# Patient Record
Sex: Male | Born: 1965 | Race: Black or African American | Hispanic: No | Marital: Single | Smoking: Former smoker
Health system: Southern US, Community
[De-identification: ages and names within clinical notes are randomized; demographics above are authoritative.]

## PROBLEM LIST (undated history)

## (undated) DIAGNOSIS — E7800 Pure hypercholesterolemia, unspecified: Secondary | ICD-10-CM

## (undated) DIAGNOSIS — T7840XA Allergy, unspecified, initial encounter: Secondary | ICD-10-CM

## (undated) DIAGNOSIS — I10 Essential (primary) hypertension: Secondary | ICD-10-CM

## (undated) DIAGNOSIS — R7989 Other specified abnormal findings of blood chemistry: Secondary | ICD-10-CM

## (undated) DIAGNOSIS — E119 Type 2 diabetes mellitus without complications: Secondary | ICD-10-CM

## (undated) HISTORY — PX: WISDOM TOOTH EXTRACTION: SHX21

## (undated) HISTORY — PX: UPPER GASTROINTESTINAL ENDOSCOPY: SHX188

## (undated) HISTORY — PX: COLONOSCOPY: SHX174

## (undated) HISTORY — PX: HERNIA REPAIR: SHX51

## (undated) HISTORY — DX: Other specified abnormal findings of blood chemistry: R79.89

## (undated) HISTORY — DX: Allergy, unspecified, initial encounter: T78.40XA

---

## 2008-03-05 HISTORY — PX: NASAL SINUS SURGERY: SHX719

## 2013-06-08 ENCOUNTER — Encounter (HOSPITAL_COMMUNITY): Payer: Self-pay | Admitting: Emergency Medicine

## 2013-06-08 ENCOUNTER — Emergency Department (HOSPITAL_COMMUNITY)
Admission: EM | Admit: 2013-06-08 | Discharge: 2013-06-09 | Disposition: A | Discharge status: Home / Self Care | Payer: 59 | Attending: Emergency Medicine | Admitting: Emergency Medicine

## 2013-06-08 DIAGNOSIS — I10 Essential (primary) hypertension: Secondary | ICD-10-CM | POA: Insufficient documentation

## 2013-06-08 DIAGNOSIS — Z79899 Other long term (current) drug therapy: Secondary | ICD-10-CM | POA: Insufficient documentation

## 2013-06-08 DIAGNOSIS — L03019 Cellulitis of unspecified finger: Secondary | ICD-10-CM | POA: Insufficient documentation

## 2013-06-08 DIAGNOSIS — L03011 Cellulitis of right finger: Secondary | ICD-10-CM

## 2013-06-08 DIAGNOSIS — E119 Type 2 diabetes mellitus without complications: Secondary | ICD-10-CM | POA: Insufficient documentation

## 2013-06-08 HISTORY — DX: Essential (primary) hypertension: I10

## 2013-06-08 HISTORY — DX: Pure hypercholesterolemia, unspecified: E78.00

## 2013-06-08 HISTORY — DX: Type 2 diabetes mellitus without complications: E11.9

## 2013-06-08 NOTE — ED Notes (Signed)
Pt states that approx. 1 week ago he started having swelling on the tip of his R thumb. Went to PCP who was going to lance it but didn't. Stated that it drained some pus on its own and felt better but then filled back up and is now painful again. Alert and oriented.

## 2013-06-09 MED ORDER — HYDROCODONE-ACETAMINOPHEN 5-325 MG PO TABS: 1.0000 | ORAL_TABLET | ORAL | Status: DC | PRN

## 2013-06-09 MED ORDER — HYDROCODONE-ACETAMINOPHEN 5-325 MG PO TABS
1.0000 | ORAL_TABLET | Freq: Once | ORAL | Status: AC
Start: 2013-06-09 — End: 2013-06-09
  Administered 2013-06-09: 1 via ORAL
  Filled 2013-06-09: qty 1

## 2013-06-09 MED ORDER — CEPHALEXIN 500 MG PO CAPS: 500.0000 mg | ORAL_CAPSULE | Freq: Two times a day (BID) | ORAL | Status: DC

## 2013-06-09 NOTE — Discharge Instructions (Signed)
Follow up with your doctor in 2 days for wound recheck. Monitor for signs of infection. Take tylenol or ibuprofen as needed for pain. Return to emergency department if you develop any signs of infection, redness, worsening pain or swelling.    Paronychia Paronychia is an inflammatory reaction involving the folds of the skin surrounding the fingernail. This is commonly caused by an infection in the skin around a nail. The most common cause of paronychia is frequent wetting of the hands (as seen with bartenders, food servers, nurses or others who wet their hands). This makes the skin around the fingernail susceptible to infection by bacteria (germs) or fungus. Other predisposing factors are:  Aggressive manicuring.  Nail biting.  Thumb sucking. The most common cause is a staphylococcal (a type of germ) infection, or a fungal (Candida) infection. When caused by a germ, it usually comes on suddenly with redness, swelling, pus and is often painful. It may get under the nail and form an abscess (collection of pus), or form an abscess around the nail. If the nail itself is infected with a fungus, the treatment is usually prolonged and may require oral medicine for up to one year. Your caregiver will determine the length of time treatment is required. The paronychia caused by bacteria (germs) may largely be avoided by not pulling on hangnails or picking at cuticles. When the infection occurs at the tips of the finger it is called felon. When the cause of paronychia is from the herpes simplex virus (HSV) it is called herpetic whitlow. TREATMENT  When an abscess is present treatment is often incision and drainage. This means that the abscess must be cut open so the pus can get out. When this is done, the following home care instructions should be followed. HOME CARE INSTRUCTIONS   It is important to keep the affected fingers very dry. Rubber or plastic gloves over cotton gloves should be used whenever the hand  must be placed in water.  Keep wound clean, dry and dressed as suggested by your caregiver between warm soaks or warm compresses.  Soak in warm water for fifteen to twenty minutes three to four times per day for bacterial infections. Fungal infections are very difficult to treat, so often require treatment for long periods of time.  For bacterial (germ) infections take antibiotics (medicine which kill germs) as directed and finish the prescription, even if the problem appears to be solved before the medicine is gone.  Only take over-the-counter or prescription medicines for pain, discomfort, or fever as directed by your caregiver. SEEK IMMEDIATE MEDICAL CARE IF:  You have redness, swelling, or increasing pain in the wound.  You notice pus coming from the wound.  You have a fever.  You notice a bad smell coming from the wound or dressing. Document Released: 08/15/2000 Document Revised: 05/14/2011 Document Reviewed: 04/16/2008 [facility] Patient Information 2014 [place].

## 2013-06-09 NOTE — ED Provider Notes (Signed)
CSN: [id number]     Arrival date & time 06/08/13  2144 History   First MD Initiated Contact with Patient 06/09/13 0239     Chief Complaint  Patient presents with  . Wound Infection     (Consider location/radiation/quality/duration/timing/severity/associated sxs/prior Treatment) HPI 48 yo male presents with right thumb pain and swelling x 9 days. Patient describes a throbbing pain constant rated at 8/10 currently. Patient denies any trauma to thumb. Patient does admit to clipping his nails about a week prior to the pain and swelling. Patient denies any fever/chills, N/V, or dizziness. Denies numbness or weakness. PMH significant for DM, HTN, and High cholesterol.  Past Medical History  Diagnosis Date  . Hypertension   . High cholesterol   . DM (diabetes mellitus)     type 2   No past surgical history on file. No family history on file. History  Substance Use Topics  . Smoking status: Never Smoker   . Smokeless tobacco: Not on file  . Alcohol Use: No    Review of Systems  All other systems reviewed and are negative.      Allergies  Review of patient's allergies indicates no known allergies.  Home Medications   Current Outpatient Rx  Name  Route  Sig  Dispense  Refill  . acetaminophen (TYLENOL) 325 MG tablet   Oral   Take 650 mg by mouth every 6 (six) hours as needed (pain).         . metFORMIN (GLUCOPHAGE) 500 MG tablet   Oral   Take 500 mg by mouth daily with breakfast.         . Pseudoephedrine-Ibuprofen (ADVIL COLD & SINUS LIQUI-GELS PO)   Oral   Take 1 capsule by mouth daily as needed (flu-like symptoms).         . cephALEXin (KEFLEX) 500 MG capsule   Oral   Take 1 capsule (500 mg total) by mouth 2 (two) times daily.   14 capsule   0   . HYDROcodone-acetaminophen (NORCO) 5-325 MG per tablet   Oral   Take 1-2 tablets by mouth every 4 (four) hours as needed.   10 tablet   0    BP 132/76  Pulse 84  Temp(Src) 98.2 F (36.8 C) (Oral)  SpO2  99% Physical Exam  Nursing note and vitals reviewed. Constitutional: He is oriented to person, place, and time. He appears well-developed and well-nourished. No distress.  HENT:  Head: Normocephalic and atraumatic.  Eyes: Conjunctivae are normal.  Neck: No JVD present. No tracheal deviation present.  Cardiovascular: Normal rate and regular rhythm.  Exam reveals no gallop and no friction rub.   No murmur heard. Pulmonary/Chest: Effort normal. No respiratory distress. He has no wheezes. He has no rhonchi. He has no rales.  Musculoskeletal: Normal range of motion. He exhibits no edema.       Hands: Neurological: He is alert and oriented to person, place, and time.  Skin: Skin is warm and dry. He is not diaphoretic.  Psychiatric: He has a normal mood and affect. His behavior is normal.    ED Course  Procedures (including critical care time) Labs Review Labs Reviewed - No data to display Imaging Review No results found.   EKG Interpretation None     INCISION AND DRAINAGE Performed by: Sherrie George Consent: Verbal consent obtained. Risks and benefits: risks, benefits and alternatives were discussed Time out performed prior to procedure Type: Paronychia Body area: Right Thumb Anesthesia: Digital Block Incision  was made with a scalpel. Local anesthetic: lidocaine 2% w/o epinephrine Anesthetic total: 3 ml Complexity: complex Blunt dissection to break up loculations Drainage: purulent Drainage amount: 2 ml Packing material: none Patient tolerance: Patient tolerated the procedure well with no immediate complications.  MDM   Final diagnoses:  Paronychia of right thumb  Patient afebrile with normal VS.  Exam consistent with paronychia. No evidence of joint or tendon involvement on exam.  Plan to treat patient's soft tissue skin infection with keflex and have him follow up with his PCP in 2 days for wound check. Advised patient to monitor for signs of infection, worsening  redness or swelling. Patient agrees with plan. discharged in good condition.  Meds given in ED:  Medications  HYDROcodone-acetaminophen (NORCO/VICODIN) 5-325 MG per tablet 1 tablet (1 tablet Oral Given 06/09/13 0337)    Discharge Medication List as of 06/09/2013  3:32 AM    START taking these medications   Details  cephALEXin (KEFLEX) 500 MG capsule Take 1 capsule (500 mg total) by mouth 2 (two) times daily., Starting 06/09/2013, Until Discontinued, Print    HYDROcodone-acetaminophen (NORCO) 5-325 MG per tablet Take 1-2 tablets by mouth every 4 (four) hours as needed., Starting 06/09/2013, Until Discontinued, Print           Sherrie George, PA-C 06/10/13 2241

## 2013-06-09 NOTE — ED Notes (Addendum)
Pt presents to ED with swelling and inflammation to his 1st digit on rt hand. He states  Pain onset on last Wednesday and went down on Saturday.

## 2013-06-11 NOTE — ED Provider Notes (Signed)
Medical screening examination/treatment/procedure(s) were conducted as a shared visit with non-physician practitioner(s) and myself.  I personally evaluated the patient during the encounter.  Right thumb pain and swelling, complains of infection. On exam has right thumb tenderness adjacent to the nailbed consistent with paronychia. I/D by [facility] with purulent drainage. Instructions and precautions provided. Patient is diabetic and will be placed on a course of antibiotics.   Teressa Lower, MD 06/11/13 0001

## 2013-07-01 ENCOUNTER — Ambulatory Visit: Payer: Self-pay | Admitting: Podiatrist

## 2013-07-24 ENCOUNTER — Ambulatory Visit (INDEPENDENT_AMBULATORY_CARE_PROVIDER_SITE_OTHER): Payer: 59 | Admitting: Physician Assistant

## 2013-07-24 ENCOUNTER — Encounter: Payer: Self-pay | Admitting: Physician Assistant

## 2013-07-24 VITALS — BP 122/82 | HR 89 | Temp 97.9°F | Resp 16 | Ht 70.0 in | Wt 287.2 lb

## 2013-07-24 DIAGNOSIS — E785 Hyperlipidemia, unspecified: Secondary | ICD-10-CM

## 2013-07-24 DIAGNOSIS — E119 Type 2 diabetes mellitus without complications: Secondary | ICD-10-CM

## 2013-07-24 DIAGNOSIS — Z0000 Encounter for general adult medical examination without abnormal findings: Secondary | ICD-10-CM

## 2013-07-24 DIAGNOSIS — R7989 Other specified abnormal findings of blood chemistry: Secondary | ICD-10-CM

## 2013-07-24 DIAGNOSIS — Z125 Encounter for screening for malignant neoplasm of prostate: Secondary | ICD-10-CM

## 2013-07-24 DIAGNOSIS — E291 Testicular hypofunction: Secondary | ICD-10-CM

## 2013-07-24 LAB — CBC
HCT: 43.9 % (ref 39.0–52.0)
Hemoglobin: 15.2 g/dL (ref 13.0–17.0)
MCH: 27 pg (ref 26.0–34.0)
MCHC: 34.6 g/dL (ref 30.0–36.0)
MCV: 78.1 fL (ref 78.0–100.0)
Platelets: 333 K/uL (ref 150–400)
RBC: 5.62 MIL/uL (ref 4.22–5.81)
RDW: 13.5 % (ref 11.5–15.5)
WBC: 8 K/uL (ref 4.0–10.5)

## 2013-07-24 LAB — GLUCOSE, POCT (MANUAL RESULT ENTRY): POC Glucose: 99 mg/dl (ref 70–99)

## 2013-07-24 NOTE — Progress Notes (Signed)
Pre visit review using our clinic review tool, if applicable. No additional management support is needed unless otherwise documented below in the visit note/SLS  

## 2013-07-24 NOTE — Progress Notes (Signed)
Patient presents to clinic today to establish care. Patient is fasting for labs.   Acute Concerns: Patient wishes to have repeat testing for diabetes (see below).   Chronic Issues: Hyperlipidemia -- Currently on Pravachol 20 mg daily.  Denies myalgias or arthralgias.  Patient is fasting for labs.   Hypertension -- Currently on Losartan 50 mg.  BP is 122/82 today in clinic.  Denies chest pain, palpitations, lightheadedness, dizziness, SOB, headaches or vision changes.  Diabetes Mellitus, Type II -- Endorses diagnosis several years prior but was always controlled with diet and exericse.  Started seeing a new PCP about 2-3 months ago who then placed him on Xigduo daily.  Patient states his blood sugar was evaluated solely by a fingerstick glucose check.  Patient has already seen a Ophthalmologist, Podiatrist and a dentist.  States workup unremarkable for any causes associated with diabetes.  Has copy of those records.  Does not have copy of records from previous PCP.   Onychomycosis -- Followed by Podiatry.  Currently on Lamisil.   Health Maintenance: Dental -- up-to-date Vision -- sees Ophthalmology Immunizations -- endorses Up-to-date Colonoscopy -- 2011; no abnormalities.  Due in 2021.  Past Medical History  Diagnosis Date  . Hypertension   . High cholesterol   . DM (diabetes mellitus)     type 2  . Low testosterone     Past Surgical History  Procedure Laterality Date  . Nasal sinus surgery  2010  . Wisdom tooth extraction      Current Outpatient Prescriptions on File Prior to Visit  Medication Sig Dispense Refill  . acetaminophen (TYLENOL) 325 MG tablet Take 650 mg by mouth every 6 (six) hours as needed (pain).      . Pseudoephedrine-Ibuprofen (ADVIL COLD & SINUS LIQUI-GELS PO) Take 1 capsule by mouth daily as needed (flu-like symptoms).       No current facility-administered medications on file prior to visit.    No Known Allergies  Family History  Problem Relation Age  of Onset  . Aneurysm Mother 15    Deceased  . Healthy Father     Living  . Prostate cancer Paternal Uncle   . Healthy Daughter     x3    History   Social History  . Marital Status: Single    Spouse Name: N/A    Number of Children: N/A  . Years of Education: N/A   Occupational History  . Not on file.   Social History Main Topics  . Smoking status: Former Smoker -- 0.50 packs/day for 15 years    Quit date: 04/17/2005  . Smokeless tobacco: Never Used  . Alcohol Use: No  . Drug Use: No  . Sexual Activity: Not on file   Other Topics Concern  . Not on file   Social History Narrative  . No narrative on file   Review of Systems  Constitutional: Negative for fever and weight loss.  HENT: Negative for ear discharge, ear pain, hearing loss and tinnitus.   Eyes: Negative for blurred vision, double vision, photophobia and pain.  Respiratory: Negative for cough and shortness of breath.   Cardiovascular: Negative for chest pain and palpitations.  Gastrointestinal: Negative for heartburn, nausea, vomiting, abdominal pain, diarrhea, constipation, blood in stool and melena.  Genitourinary: Negative for dysuria, urgency, frequency, hematuria and flank pain.       Nocturia x 1.  No concerns about ED  Neurological: Negative for dizziness, loss of consciousness and headaches.  Endo/Heme/Allergies: Positive for environmental allergies.  Psychiatric/Behavioral: Negative for depression, suicidal ideas, hallucinations and substance abuse. The patient is not nervous/anxious and does not have insomnia.    BP 122/82  Pulse 89  Temp(Src) 97.9 F (36.6 C) (Oral)  Resp 16  Ht 5\' 10"  (1.778 m)  Wt 287 lb 4 oz (130.296 kg)  BMI 41.22 kg/m2  SpO2 98%  Physical Exam  Vitals reviewed. Constitutional: He is oriented to person, place, and time and well-developed, well-nourished, and in no distress.  HENT:  Head: Normocephalic and atraumatic.  Right Ear: External ear normal.  Left Ear:  External ear normal.  Nose: Nose normal.  Mouth/Throat: Oropharynx is clear and moist. No oropharyngeal exudate.  TM within normal limits bilaterally.   Eyes: Conjunctivae are normal. Pupils are equal, round, and reactive to light.  Neck: Neck supple. No thyromegaly present.  Cardiovascular: Normal rate, regular rhythm, normal heart sounds and intact distal pulses.   Pulmonary/Chest: Effort normal and breath sounds normal. No respiratory distress. He has no wheezes. He has no rales. He exhibits no tenderness.  Abdominal: Soft. Bowel sounds are normal. He exhibits no distension and no mass. There is no tenderness. There is no rebound and no guarding.  Lymphadenopathy:    He has no cervical adenopathy.  Neurological: He is alert and oriented to person, place, and time. No cranial nerve deficit.  Skin: Skin is warm and dry.  Toenails bilaterally are thickened and discolored.   Psychiatric: Mood, memory, affect and judgment normal.    Recent Results (from the past 2160 hour(s))  GLUCOSE, POCT (MANUAL RESULT ENTRY)     Status: Normal   Collection Time    07/24/13  3:01 PM      Result Value Ref Range   POC Glucose 99  70 - 99 mg/dl   Comment: Fasting  CBC     Status: None   Collection Time    07/24/13  3:43 PM      Result Value Ref Range   WBC 8.0  4.0 - 10.5 K/uL   RBC 5.62  4.22 - 5.81 MIL/uL   Hemoglobin 15.2  13.0 - 17.0 g/dL   HCT 43.9  39.0 - 52.0 %   MCV 78.1  78.0 - 100.0 fL   MCH 27.0  26.0 - 34.0 pg   MCHC 34.6  30.0 - 36.0 g/dL   RDW 13.5  11.5 - 15.5 %   Platelets 333  150 - 400 K/uL  BASIC METABOLIC PANEL     Status: None   Collection Time    07/24/13  3:43 PM      Result Value Ref Range   Sodium 137  135 - 145 mEq/L   Potassium 4.5  3.5 - 5.3 mEq/L   Chloride 99  96 - 112 mEq/L   CO2 29  19 - 32 mEq/L   Glucose, Bld 94  70 - 99 mg/dL   BUN 14  6 - 23 mg/dL   Creat 1.11  0.50 - 1.35 mg/dL   Calcium 9.5  8.4 - 10.5 mg/dL  HEPATIC FUNCTION PANEL     Status: None    Collection Time    07/24/13  3:43 PM      Result Value Ref Range   Total Bilirubin 0.6  0.2 - 1.2 mg/dL   Bilirubin, Direct 0.1  0.0 - 0.3 mg/dL   Indirect Bilirubin 0.5  0.2 - 1.2 mg/dL   Alkaline Phosphatase 80  39 - 117 U/L   AST 17  0 - 37  U/L   ALT 18  0 - 53 U/L   Total Protein 7.8  6.0 - 8.3 g/dL   Albumin 4.6  3.5 - 5.2 g/dL  TSH     Status: None   Collection Time    07/24/13  3:43 PM      Result Value Ref Range   TSH 1.647  0.350 - 4.500 uIU/mL  HEMOGLOBIN A1C     Status: Abnormal   Collection Time    07/24/13  3:43 PM      Result Value Ref Range   Hemoglobin A1C 8.8 (*) <5.7 %   Comment:                                                                            According to the ADA Clinical Practice Recommendations for 2011, when     HbA1c is used as a screening test:             >=6.5%   Diagnostic of Diabetes Mellitus                (if abnormal result is confirmed)           5.7-6.4%   Increased risk of developing Diabetes Mellitus           References:Diagnosis and Classification of Diabetes Mellitus,Diabetes     BZJI,9678,93(YBOFB 1):S62-S69 and Standards of Medical Care in             Diabetes - 2011,Diabetes Care,2011,34 (Suppl 1):S11-S61.         Mean Plasma Glucose 206 (*) <117 mg/dL  URINALYSIS, MICROSCOPIC ONLY     Status: None   Collection Time    07/24/13  3:43 PM      Result Value Ref Range   Squamous Epithelial / LPF NONE SEEN  RARE   Crystals NONE SEEN  NONE SEEN   Casts NONE SEEN  NONE SEEN   WBC, UA 0-2  <3 WBC/hpf   RBC / HPF 0-2  <3 RBC/hpf   Bacteria, UA NONE SEEN  RARE  TESTOSTERONE, FREE, TOTAL     Status: Abnormal   Collection Time    07/24/13  3:43 PM      Result Value Ref Range   Testosterone 299 (*) 300 - 890 ng/dL   Comment:           Tanner Stage       Male              Male                   I              < 30 ng/dL        < 10 ng/dL                   II             < 150 ng/dL       < 30 ng/dL                   III             100-320 ng/dL     <  35 ng/dL                   IV             200-970 ng/dL     15-40 ng/dL                   V/Adult        300-890 ng/dL     10-70 ng/dL         Sex Hormone Binding 17  13 - 71 nmol/L   Testosterone, Free 81.4  47.0 - 244.0 pg/mL   Comment:       The concentration of free testosterone is derived from a mathematical     expression based on constants for the binding of testosterone to sex     hormone-binding globulin and albumin.   Testosterone-% Free 2.7  1.6 - 2.9 %  FSH/LH     Status: None   Collection Time    07/24/13  3:43 PM      Result Value Ref Range   FSH 3.5  1.4 - 18.1 mIU/mL   Comment: Reference Ranges:              Male:                         1.4 -  18.1 mIU/mL              Male:   Follicular Phase    2.5 -  10.2 mIU/mL                        MidCycle Peak       3.4 -  33.4 mIU/mL                        Luteal Phase        1.5 -   9.1 mIU/mL                        Post Menopausal    23.0 - 116.3 mIU/mL                        Pregnant                <   0.3 mIU/mL   LH 5.5  1.5 - 9.3 mIU/mL   Comment: Reference Ranges:              Male:     20 - 70 Years           1.5 -  9.3 mIU/mL                           > 70 Years           3.1 - 34.6 mIU/mL              Male:   Follicular Phase        1.9 - 12.5 mIU/mL                        Midcycle                8.7 - 76.3 mIU/mL  Luteal Phase            0.5 - 16.9 mIU/mL                        Post Menopausal        15.9 - 54.0 mIU/mL                        Pregnant                    <  1.5 mIU/mL                        Contraceptives          0.7 -  5.6 mIU/mL              Children:                             <  6.0 mIU/mL        PSA     Status: None   Collection Time    07/24/13  3:43 PM      Result Value Ref Range   PSA 0.35  <=4.00 ng/mL   Comment: Test Methodology: ECLIA PSA (Electrochemiluminescence Immunoassay)           For PSA values from 2.5-4.0,  particularly in younger men <60 years     old, the AUA and NCCN suggest testing for % Free PSA (3515) and     evaluation of the rate of increase in PSA (PSA velocity).  LIPID PANEL     Status: Abnormal   Collection Time    07/24/13  3:43 PM      Result Value Ref Range   Cholesterol 166  0 - 200 mg/dL   Comment: ATP III Classification:           < 200        mg/dL        Desirable          200 - 239     mg/dL        Borderline High          >= 240        mg/dL        High         Triglycerides 94  <150 mg/dL   HDL 37 (*) >39 mg/dL   Total CHOL/HDL Ratio 4.5     VLDL 19  0 - 40 mg/dL   LDL Cholesterol 110 (*) 0 - 99 mg/dL   Comment:       Total Cholesterol/HDL Ratio:CHD Risk                            Coronary Heart Disease Risk Table                                            Men       Women              1/2 Average Risk              3.4        3.3  Average Risk              5.0        4.4               2X Average Risk              9.6        7.1               3X Average Risk             23.4       11.0     Use the calculated Patient Ratio above and the CHD Risk table      to determine the patient's CHD Risk.     ATP III Classification (LDL):           < 100        mg/dL         Optimal          100 - 129     mg/dL         Near or Above Optimal          130 - 159     mg/dL         Borderline High          160 - 189     mg/dL         High           > 190        mg/dL         Very High        MICROALBUMIN / CREATININE URINE RATIO     Status: None   Collection Time    07/24/13  3:43 PM      Result Value Ref Range   Microalb, Ur 0.50  0.00 - 1.89 mg/dL   Creatinine, Urine 79.3     Microalb Creat Ratio 6.3  0.0 - 30.0 mg/g   Assessment/Plan: Type II or unspecified type diabetes mellitus without mention of complication, not stated as uncontrolled Will obtain BMP, A1C, UA and Urine microalbumin. Continue Xigduo as directed until results are in.  Diabetic foot exam  performed with evidence of onychomycosis but no sign of ulceration, infection or abnormal sensation.  Patient followed by Podiatry and Ophthalmology.  Patient is up-to-date on diabetic eye examination.  Hyperlipidemia Will obtain fasting lipid panel.  Low serum testosterone level Will obtain free and total testosterone level.  Prostate cancer screening Will obtain PSA.  Visit for preventive health examination Health history reviewed.  Will obtain fasting labs to include PSA.  Will obtain records from previous PCP.

## 2013-07-24 NOTE — Patient Instructions (Signed)
Please obtain labs. I will call you with your results.  Please continue medications for the time being.  Please read information below on Preventive care for adult men.  It was a pleasure participating in your care today.  Preventive Care for Adults, Male A healthy lifestyle and preventive care can promote health and wellness. Preventive health guidelines for men include the following key practices:  A routine yearly physical is a good way to check with your health care provider about your health and preventative screening. It is a chance to share any concerns and updates on your health and to receive a thorough exam.  Visit your dentist for a routine exam and preventative care every 6 months. Brush your teeth twice a day and floss once a day. Good oral hygiene prevents tooth decay and gum disease.  The frequency of eye exams is based on your age, health, family medical history, use of contact lenses, and other factors. Follow your health care provider's recommendations for frequency of eye exams.  Eat a healthy diet. Foods such as vegetables, fruits, whole grains, low-fat dairy products, and lean protein foods contain the nutrients you need without too many calories. Decrease your intake of foods high in solid fats, added sugars, and salt. Eat the right amount of calories for you.Get information about a proper diet from your health care provider, if necessary.  Regular physical exercise is one of the most important things you can do for your health. Most adults should get at least 150 minutes of moderate-intensity exercise (any activity that increases your heart rate and causes you to sweat) each week. In addition, most adults need muscle-strengthening exercises on 2 or more days a week.  Maintain a healthy weight. The body mass index (BMI) is a screening tool to identify possible weight problems. It provides an estimate of body fat based on height and weight. Your health care provider can find your  BMI and can help you achieve or maintain a healthy weight.For adults 20 years and older:  A BMI below 18.5 is considered underweight.  A BMI of 18.5 to 24.9 is normal.  A BMI of 25 to 29.9 is considered overweight.  A BMI of 30 and above is considered obese.  Maintain normal blood lipids and cholesterol levels by exercising and minimizing your intake of saturated fat. Eat a balanced diet with plenty of fruit and vegetables. Blood tests for lipids and cholesterol should begin at age 46 and be repeated every 5 years. If your lipid or cholesterol levels are high, you are over 50, or you are at high risk for heart disease, you may need your cholesterol levels checked more frequently.Ongoing high lipid and cholesterol levels should be treated with medicines if diet and exercise are not working.  If you smoke, find out from your health care provider how to quit. If you do not use tobacco, do not start.  Lung cancer screening is recommended for adults aged 56 80 years who are at high risk for developing lung cancer because of a history of smoking. A yearly low-dose CT scan of the lungs is recommended for people who have at least a 30-pack-year history of smoking and are a current smoker or have quit within the past 15 years. A pack year of smoking is smoking an average of 1 pack of cigarettes a day for 1 year (for example: 1 pack a day for 30 years or 2 packs a day for 15 years). Yearly screening should continue until the  smoker has stopped smoking for at least 15 years. Yearly screening should be stopped for people who develop a health problem that would prevent them from having lung cancer treatment.  If you choose to drink alcohol, do not have more than 2 drinks per day. One drink is considered to be 12 ounces (355 mL) of beer, 5 ounces (148 mL) of wine, or 1.5 ounces (44 mL) of liquor.  Avoid use of street drugs. Do not share needles with anyone. Ask for help if you need support or instructions  about stopping the use of drugs.  High blood pressure causes heart disease and increases the risk of stroke. Your blood pressure should be checked at least every 1 2 years. Ongoing high blood pressure should be treated with medicines, if weight loss and exercise are not effective.  If you are 66 48 years old, ask your health care provider if you should take aspirin to prevent heart disease.  Diabetes screening involves taking a blood sample to check your fasting blood sugar level. This should be done once every 3 years, after age 62, if you are within normal weight and without risk factors for diabetes. Testing should be considered at a younger age or be carried out more frequently if you are overweight and have at least 1 risk factor for diabetes.  Colorectal cancer can be detected and often prevented. Most routine colorectal cancer screening begins at the age of 49 and continues through age 35. However, your health care provider may recommend screening at an earlier age if you have risk factors for colon cancer. On a yearly basis, your health care provider may provide home test kits to check for hidden blood in the stool. Use of a small camera at the end of a tube to directly examine the colon (sigmoidoscopy or colonoscopy) can detect the earliest forms of colorectal cancer. Talk to your health care provider about this at age 21, when routine screening begins. Direct exam of the colon should be repeated every 5 10 years through age 58, unless early forms of precancerous polyps or small growths are found.  People who are at an increased risk for hepatitis B should be screened for this virus. You are considered at high risk for hepatitis B if:  You were born in a country where hepatitis B occurs often. Talk with your health care provider about which countries are considered high-risk.  Your parents were born in a high-risk country and you have not received a shot to protect against hepatitis B  (hepatitis B vaccine).  You have HIV or AIDS.  You use needles to inject street drugs.  You live with, or have sex with, someone who has hepatitis B.  You are a man who has sex with other men (MSM).  You get hemodialysis treatment.  You take certain medicines for conditions such as cancer, organ transplantation, and autoimmune conditions.  Hepatitis C blood testing is recommended for all people born from [birth year] through 1965 and any individual with known risks for hepatitis C.  Practice safe sex. Use condoms and avoid high-risk sexual practices to reduce the spread of sexually transmitted infections (STIs). STIs include gonorrhea, chlamydia, syphilis, trichomonas, herpes, HPV, and human immunodeficiency virus (HIV). Herpes, HIV, and HPV are viral illnesses that have no cure. They can result in disability, cancer, and death.  A one-time screening for abdominal aortic aneurysm (AAA) and surgical repair of large AAAs by ultrasound are recommended for men ages 76 to 25 years who  are current or former smokers.  Healthy men should no longer receive prostate-specific antigen (PSA) blood tests as part of routine cancer screening. Talk with your health care provider about prostate cancer screening.  Testicular cancer screening is not recommended for adult males who have no symptoms. Screening includes self-exam, a health care provider exam, and other screening tests. Consult with your health care provider about any symptoms you have or any concerns you have about testicular cancer.  Use sunscreen. Apply sunscreen liberally and repeatedly throughout the day. You should seek shade when your shadow is shorter than you. Protect yourself by wearing long sleeves, pants, a wide-brimmed hat, and sunglasses year round, whenever you are outdoors.  Once a month, do a whole-body skin exam, using a mirror to look at the skin on your back. Tell your health care provider about new moles, moles that have irregular  borders, moles that are larger than a pencil eraser, or moles that have changed in shape or color.  Stay current with required vaccines (immunizations).  Influenza vaccine. All adults should be immunized every year.  Tetanus, diphtheria, and acellular pertussis (Td, Tdap) vaccine. An adult who has not previously received Tdap or who does not know his vaccine status should receive 1 dose of Tdap. This initial dose should be followed by tetanus and diphtheria toxoids (Td) booster doses every 10 years. Adults with an unknown or incomplete history of completing a 3-dose immunization series with Td-containing vaccines should begin or complete a primary immunization series including a Tdap dose. Adults should receive a Td booster every 10 years.  Varicella vaccine. An adult without evidence of immunity to varicella should receive 2 doses or a second dose if he has previously received 1 dose.  Human papillomavirus (HPV) vaccine. Males aged 11 21 years who have not received the vaccine previously should receive the 3-dose series. Males aged 46 26 years may be immunized. Immunization is recommended through the age of 33 years for any male who has sex with males and did not get any or all doses earlier. Immunization is recommended for any person with an immunocompromised condition through the age of 4 years if he did not get any or all doses earlier. During the 3-dose series, the second dose should be obtained 4 8 weeks after the first dose. The third dose should be obtained 24 weeks after the first dose and 16 weeks after the second dose.  Zoster vaccine. One dose is recommended for adults aged 25 years or older unless certain conditions are present.  Measles, mumps, and rubella (MMR) vaccine. Adults born before [birth year] generally are considered immune to measles and mumps. Adults born in [birth year] or later should have 1 or more doses of MMR vaccine unless there is a contraindication to the vaccine or there is  laboratory evidence of immunity to each of the three diseases. A routine second dose of MMR vaccine should be obtained at least 28 days after the first dose for students attending postsecondary schools, health care workers, or international travelers. People who received inactivated measles vaccine or an unknown type of measles vaccine during 1963 1967 should receive 2 doses of MMR vaccine. People who received inactivated mumps vaccine or an unknown type of mumps vaccine before 1979 and are at high risk for mumps infection should consider immunization with 2 doses of MMR vaccine. Unvaccinated health care workers born before 18 who lack laboratory evidence of measles, mumps, or rubella immunity or laboratory confirmation of disease should consider measles and  mumps immunization with 2 doses of MMR vaccine or rubella immunization with 1 dose of MMR vaccine.  Pneumococcal 13-valent conjugate (PCV13) vaccine. When indicated, a person who is uncertain of his immunization history and has no record of immunization should receive the PCV13 vaccine. An adult aged 63 years or older who has certain medical conditions and has not been previously immunized should receive 1 dose of PCV13 vaccine. This PCV13 should be followed with a dose of pneumococcal polysaccharide (PPSV23) vaccine. The PPSV23 vaccine dose should be obtained at least 8 weeks after the dose of PCV13 vaccine. An adult aged 60 years or older who has certain medical conditions and previously received 1 or more doses of PPSV23 vaccine should receive 1 dose of PCV13. The PCV13 vaccine dose should be obtained 1 or more years after the last PPSV23 vaccine dose.  Pneumococcal polysaccharide (PPSV23) vaccine. When PCV13 is also indicated, PCV13 should be obtained first. All adults aged 51 years and older should be immunized. An adult younger than age 12 years who has certain medical conditions should be immunized. Any person who resides in a nursing home or  long-term care facility should be immunized. An adult smoker should be immunized. People with an immunocompromised condition and certain other conditions should receive both PCV13 and PPSV23 vaccines. People with human immunodeficiency virus (HIV) infection should be immunized as soon as possible after diagnosis. Immunization during chemotherapy or radiation therapy should be avoided. Routine use of PPSV23 vaccine is not recommended for American Indians, [place] Natives, or people younger than 65 years unless there are medical conditions that require PPSV23 vaccine. When indicated, people who have unknown immunization and have no record of immunization should receive PPSV23 vaccine. One-time revaccination 5 years after the first dose of PPSV23 is recommended for people aged 61 64 years who have chronic kidney failure, nephrotic syndrome, asplenia, or immunocompromised conditions. People who received 1 2 doses of PPSV23 before age 59 years should receive another dose of PPSV23 vaccine at age 54 years or later if at least 5 years have passed since the previous dose. Doses of PPSV23 are not needed for people immunized with PPSV23 at or after age 9 years.  Meningococcal vaccine. Adults with asplenia or persistent complement component deficiencies should receive 2 doses of quadrivalent meningococcal conjugate (MenACWY-D) vaccine. The doses should be obtained at least 2 months apart. Microbiologists working with certain meningococcal bacteria, [place] recruits, people at risk during an outbreak, and people who travel to or live in countries with a high rate of meningitis should be immunized. A first-year college student up through age 62 years who is living in a residence hall should receive a dose if he did not receive a dose on or after his 16th birthday. Adults who have certain high-risk conditions should receive one or more doses of vaccine.  Hepatitis A vaccine. Adults who wish to be protected from this disease,  have certain high-risk conditions, work with hepatitis A-infected animals, work in hepatitis A research labs, or travel to or work in countries with a high rate of hepatitis A should be immunized. Adults who were previously unvaccinated and who anticipate close contact with an international adoptee during the first 60 days after arrival in the [place] from a country with a high rate of hepatitis A should be immunized.  Hepatitis B vaccine. Adults who wish to be protected from this disease, have certain high-risk conditions, may be exposed to blood or other infectious body fluids, are household contacts or  sex partners of hepatitis B positive people, are clients or workers in certain care facilities, or travel to or work in countries with a high rate of hepatitis B should be immunized.  Haemophilus influenzae type b (Hib) vaccine. A previously unvaccinated person with asplenia or sickle cell disease or having a scheduled splenectomy should receive 1 dose of Hib vaccine. Regardless of previous immunization, a recipient of a hematopoietic stem cell transplant should receive a 3-dose series 6 12 months after his successful transplant. Hib vaccine is not recommended for adults with HIV infection. Preventive Service / Frequency Ages 56 to 66  Blood pressure check.** / Every 1 to 2 years.  Lipid and cholesterol check.** / Every 5 years beginning at age 57.  Hepatitis C blood test.** / For any individual with known risks for hepatitis C.  Skin self-exam. / Monthly.  Influenza vaccine. / Every year.  Tetanus, diphtheria, and acellular pertussis (Tdap, Td) vaccine.** / Consult your health care provider. 1 dose of Td every 10 years.  Varicella vaccine.** / Consult your health care provider.  HPV vaccine. / 3 doses over 6 months, if 43 or younger.  Measles, mumps, rubella (MMR) vaccine.** / You need at least 1 dose of MMR if you were born in 1957 or later. You may also need a second  dose.  Pneumococcal 13-valent conjugate (PCV13) vaccine.** / Consult your health care provider.  Pneumococcal polysaccharide (PPSV23) vaccine.** / 1 to 2 doses if you smoke cigarettes or if you have certain conditions.  Meningococcal vaccine.** / 1 dose if you are age 77 to 76 years and a Market researcher living in a residence hall, or have one of several medical conditions. You may also need additional booster doses.  Hepatitis A vaccine.** / Consult your health care provider.  Hepatitis B vaccine.** / Consult your health care provider.  Haemophilus influenzae type b (Hib) vaccine.** / Consult your health care provider. Ages 30 to 25  Blood pressure check.** / Every 1 to 2 years.  Lipid and cholesterol check.** / Every 5 years beginning at age 26.  Lung cancer screening. / Every year if you are aged 41 80 years and have a 30-pack-year history of smoking and currently smoke or have quit within the past 15 years. Yearly screening is stopped once you have quit smoking for at least 15 years or develop a health problem that would prevent you from having lung cancer treatment.  Fecal occult blood test (FOBT) of stool. / Every year beginning at age 66 and continuing until age 22. You may not have to do this test if you get a colonoscopy every 10 years.  Flexible sigmoidoscopy** or colonoscopy.** / Every 5 years for a flexible sigmoidoscopy or every 10 years for a colonoscopy beginning at age 77 and continuing until age 66.  Hepatitis C blood test.** / For all people born from [birth year] through 1965 and any individual with known risks for hepatitis C.  Skin self-exam. / Monthly.  Influenza vaccine. / Every year.  Tetanus, diphtheria, and acellular pertussis (Tdap/Td) vaccine.** / Consult your health care provider. 1 dose of Td every 10 years.  Varicella vaccine.** / Consult your health care provider.  Zoster vaccine.** / 1 dose for adults aged 12 years or older.  Measles, mumps,  rubella (MMR) vaccine.** / You need at least 1 dose of MMR if you were born in 1957 or later. You may also need a second dose.  Pneumococcal 13-valent conjugate (PCV13) vaccine.** / Consult your health  care provider.  Pneumococcal polysaccharide (PPSV23) vaccine.** / 1 to 2 doses if you smoke cigarettes or if you have certain conditions.  Meningococcal vaccine.** / Consult your health care provider.  Hepatitis A vaccine.** / Consult your health care provider.  Hepatitis B vaccine.** / Consult your health care provider.  Haemophilus influenzae type b (Hib) vaccine.** / Consult your health care provider. Ages 38 and over  Blood pressure check.** / Every 1 to 2 years.  Lipid and cholesterol check.**/ Every 5 years beginning at age 54.  Lung cancer screening. / Every year if you are aged 85 80 years and have a 30-pack-year history of smoking and currently smoke or have quit within the past 15 years. Yearly screening is stopped once you have quit smoking for at least 15 years or develop a health problem that would prevent you from having lung cancer treatment.  Fecal occult blood test (FOBT) of stool. / Every year beginning at age 36 and continuing until age 64. You may not have to do this test if you get a colonoscopy every 10 years.  Flexible sigmoidoscopy** or colonoscopy.** / Every 5 years for a flexible sigmoidoscopy or every 10 years for a colonoscopy beginning at age 34 and continuing until age 43.  Hepatitis C blood test.** / For all people born from [birth year] through 1965 and any individual with known risks for hepatitis C.  Abdominal aortic aneurysm (AAA) screening.** / A one-time screening for ages 28 to 46 years who are current or former smokers.  Skin self-exam. / Monthly.  Influenza vaccine. / Every year.  Tetanus, diphtheria, and acellular pertussis (Tdap/Td) vaccine.** / 1 dose of Td every 10 years.  Varicella vaccine.** / Consult your health care provider.  Zoster  vaccine.** / 1 dose for adults aged 51 years or older.  Pneumococcal 13-valent conjugate (PCV13) vaccine.** / Consult your health care provider.  Pneumococcal polysaccharide (PPSV23) vaccine.** / 1 dose for all adults aged 22 years and older.  Meningococcal vaccine.** / Consult your health care provider.  Hepatitis A vaccine.** / Consult your health care provider.  Hepatitis B vaccine.** / Consult your health care provider.  Haemophilus influenzae type b (Hib) vaccine.** / Consult your health care provider. **Family history and personal history of risk and conditions may change your health care provider's recommendations. Document Released: 04/17/2001 Document Revised: 12/10/2012 Document Reviewed: 07/17/2010 [facility] Patient Information 2014 [place], [place].

## 2013-07-25 LAB — LIPID PANEL
Cholesterol: 166 mg/dL (ref 0–200)
HDL: 37 mg/dL — ABNORMAL LOW (ref >39)
LDL Cholesterol: 110 mg/dL — ABNORMAL HIGH (ref 0–99)
Total CHOL/HDL Ratio: 4.5 Ratio
Triglycerides: 94 mg/dL (ref <150)
VLDL: 19 mg/dL (ref 0–40)

## 2013-07-25 LAB — BASIC METABOLIC PANEL
BUN: 14 mg/dL (ref 6–23)
CALCIUM: 9.5 mg/dL (ref 8.4–10.5)
CHLORIDE: 99 meq/L (ref 96–112)
CO2: 29 meq/L (ref 19–32)
Creat: 1.11 mg/dL (ref 0.50–1.35)
GLUCOSE: 94 mg/dL (ref 70–99)
Potassium: 4.5 mEq/L (ref 3.5–5.3)
SODIUM: 137 meq/L (ref 135–145)

## 2013-07-25 LAB — URINALYSIS, MICROSCOPIC ONLY
Bacteria, UA: NONE SEEN
CASTS: NONE SEEN
Crystals: NONE SEEN
Squamous Epithelial / LPF: NONE SEEN

## 2013-07-25 LAB — HEMOGLOBIN A1C
Hgb A1c MFr Bld: 8.8 % — ABNORMAL HIGH (ref <5.7)
MEAN PLASMA GLUCOSE: 206 mg/dL — AB (ref <117)

## 2013-07-25 LAB — FSH/LH
FSH: 3.5 m[IU]/mL (ref 1.4–18.1)
LH: 5.5 m[IU]/mL (ref 1.5–9.3)

## 2013-07-25 LAB — HEPATIC FUNCTION PANEL
ALK PHOS: 80 U/L (ref 39–117)
ALT: 18 U/L (ref 0–53)
AST: 17 U/L (ref 0–37)
Albumin: 4.6 g/dL (ref 3.5–5.2)
BILIRUBIN INDIRECT: 0.5 mg/dL (ref 0.2–1.2)
Bilirubin, Direct: 0.1 mg/dL (ref 0.0–0.3)
Total Bilirubin: 0.6 mg/dL (ref 0.2–1.2)
Total Protein: 7.8 g/dL (ref 6.0–8.3)

## 2013-07-25 LAB — MICROALBUMIN / CREATININE URINE RATIO
CREATININE, URINE: 79.3 mg/dL
MICROALB UR: 0.5 mg/dL (ref 0.00–1.89)
Microalb Creat Ratio: 6.3 mg/g (ref 0.0–30.0)

## 2013-07-25 LAB — TSH: TSH: 1.647 u[IU]/mL (ref 0.350–4.500)

## 2013-07-25 LAB — PSA: PSA: 0.35 ng/mL (ref <=4.00)

## 2013-07-27 ENCOUNTER — Other Ambulatory Visit: Payer: Self-pay | Admitting: Physician Assistant

## 2013-07-28 LAB — TESTOSTERONE, FREE, TOTAL, SHBG
Sex Hormone Binding: 17 nmol/L (ref 13–71)
TESTOSTERONE FREE: 81.4 pg/mL (ref 47.0–244.0)
TESTOSTERONE-% FREE: 2.7 % (ref 1.6–2.9)
Testosterone: 299 ng/dL — ABNORMAL LOW (ref 300–890)

## 2013-08-03 DIAGNOSIS — Z0000 Encounter for general adult medical examination without abnormal findings: Secondary | ICD-10-CM | POA: Insufficient documentation

## 2013-08-03 DIAGNOSIS — R7989 Other specified abnormal findings of blood chemistry: Secondary | ICD-10-CM | POA: Insufficient documentation

## 2013-08-03 DIAGNOSIS — E119 Type 2 diabetes mellitus without complications: Secondary | ICD-10-CM | POA: Insufficient documentation

## 2013-08-03 DIAGNOSIS — E785 Hyperlipidemia, unspecified: Secondary | ICD-10-CM | POA: Insufficient documentation

## 2013-08-03 DIAGNOSIS — Z125 Encounter for screening for malignant neoplasm of prostate: Secondary | ICD-10-CM | POA: Insufficient documentation

## 2013-08-03 NOTE — Assessment & Plan Note (Signed)
Will obtain free and total testosterone level.

## 2013-08-03 NOTE — Assessment & Plan Note (Signed)
Will obtain PSA

## 2013-08-03 NOTE — Assessment & Plan Note (Signed)
Health history reviewed.  Will obtain fasting labs to include PSA.  Will obtain records from previous PCP.

## 2013-08-03 NOTE — Assessment & Plan Note (Signed)
Will obtain fasting lipid panel 

## 2013-08-03 NOTE — Assessment & Plan Note (Signed)
Will obtain BMP, A1C, UA and Urine microalbumin. Continue Xigduo as directed until results are in.  Diabetic foot exam performed with evidence of onychomycosis but no sign of ulceration, infection or abnormal sensation.  Patient followed by Podiatry and Ophthalmology.  Patient is up-to-date on diabetic eye examination.

## 2013-08-06 ENCOUNTER — Ambulatory Visit: Payer: 59 | Admitting: Family

## 2013-08-12 ENCOUNTER — Telehealth: Payer: Self-pay | Admitting: Physician Assistant

## 2013-08-12 DIAGNOSIS — E119 Type 2 diabetes mellitus without complications: Secondary | ICD-10-CM

## 2013-08-12 NOTE — Telephone Encounter (Signed)
Received paperwork for PA on Metformin ER 1000mg , forward to nurse

## 2013-08-14 MED ORDER — SITAGLIPTIN PHOSPHATE 50 MG PO TABS: 50.0000 mg | ORAL_TABLET | Freq: Every day | ORAL | Status: DC

## 2013-08-14 MED ORDER — GLIMEPIRIDE 2 MG PO TABS: 1.0000 mg | ORAL_TABLET | Freq: Every day | ORAL | Status: DC

## 2013-08-14 MED ORDER — METFORMIN HCL 1000 MG PO TABS: 1000.0000 mg | ORAL_TABLET | Freq: Two times a day (BID) | ORAL | Status: DC

## 2013-08-14 NOTE — Telephone Encounter (Signed)
Received fax that Januvia $[phone] and requires prior authorization; Please Advise/SLS

## 2013-08-14 NOTE — Telephone Encounter (Signed)
Tried to call patient to discuss medications.  No answer. LMOM to call back for further discussion.

## 2013-08-14 NOTE — Telephone Encounter (Signed)
I will send in Rx for Metformin 1000 mg BID and Januvia 50 mg daily.  Patient is to follow-up in 1 month.  I do not see glucometer on his med list.  We need to verify that he has one at home.  If not we need to send in an Rx. I want him checking sugars once daily.

## 2013-08-14 NOTE — Telephone Encounter (Signed)
PA request for Xigduo XR [correct medication] Denied, Please Advise/SLS

## 2013-08-14 NOTE — Telephone Encounter (Signed)
Rx'd glimepiride 1 mg PO each AM.  Monitor glucose AM and PM. Follow-up as scheduled.

## 2013-08-17 MED ORDER — RELION LANCETS STANDARD 21G MISC: Status: DC

## 2013-08-17 MED ORDER — RELION CONFIRM GLUCOSE MONITOR W/DEVICE KIT: PACK | Status: DC

## 2013-08-17 NOTE — Telephone Encounter (Signed)
Patient informed, understood & agreed; Rx for blood glucose monitor kit to pharmacy/SLS

## 2013-08-27 ENCOUNTER — Telehealth: Payer: Self-pay | Admitting: Physician Assistant

## 2013-08-27 MED ORDER — LOSARTAN POTASSIUM 50 MG PO TABS: 50.0000 mg | ORAL_TABLET | Freq: Every day | ORAL | Status: DC

## 2013-08-27 NOTE — Telephone Encounter (Signed)
Rx request to pharmacy/SLS  

## 2013-08-27 NOTE — Telephone Encounter (Signed)
Losartan 50 mg tab qty 30 take 1 tablet by mouth once daily [facility] [address]

## 2013-08-28 ENCOUNTER — Telehealth: Payer: Self-pay | Admitting: Physician Assistant

## 2013-08-28 MED ORDER — PRAVASTATIN SODIUM 20 MG PO TABS: 20.0000 mg | ORAL_TABLET | Freq: Every day | ORAL | Status: DC

## 2013-08-28 NOTE — Telephone Encounter (Signed)
Refill- pravachol  [facility] on [address]

## 2013-08-28 NOTE — Telephone Encounter (Signed)
Rx request to pharmacy/SLS  

## 2013-09-07 ENCOUNTER — Telehealth: Payer: Self-pay | Admitting: *Deleted

## 2013-09-07 NOTE — Telephone Encounter (Signed)
Received message from pt that he was prescribed metformin 1000mg  twice a day and amaryl 2mg  1/2 tablet daily. Pt states he had been taking metformin twice a day and glucose readings were between 93-127. He started Amaryl on Saturday and later felt dizzy; glucose was 50. Pt states he has not taken any more of the Amaryl and is not taking the second dose of metformin in the evening if his glucose is less than 150.  Pt wants to know what he should do going forward?

## 2013-09-07 NOTE — Telephone Encounter (Signed)
D/C amaryl.  Continue metformin bid. Check sugar twice daily. Call with readings in 1 week, call sooner if sugar <80 or >250.

## 2013-09-08 NOTE — Telephone Encounter (Signed)
Notified pt and he voices understanding. 

## 2013-10-01 ENCOUNTER — Telehealth: Payer: Self-pay | Admitting: *Deleted

## 2013-10-01 NOTE — Telephone Encounter (Signed)
Patient states that Caduet was px by Dr. Danella Sensing in [place], [place] in February 2015 [place] before he established here in [id number]]/SLS Please Advise.

## 2013-10-01 NOTE — Telephone Encounter (Signed)
I am ok if he uses the Rx instead of current regimen until new insurance goes into effect.  He should monitor BP at home.

## 2013-10-01 NOTE — Telephone Encounter (Signed)
Patient states that he will have new Insurance beginning 08.15.15 and would like to know if he can substitute some remaining Caduet 5-10 mg for his Losartan 50 mg & Pravastatin 20 mg, just until he can order via new Mail Order pharmacy next month [2-wks], as he does not have enough of these medications to last him until then/SLS Spoke with provider RE: this information and we need to clarify last date Caduet was filled, to be assured that medication has not expired before making decision on this matter; LMOM with contact name and number for return call per provider instructions/SLS

## 2013-10-01 NOTE — Telephone Encounter (Signed)
LMOM with contact name and number [for return call, if needed] RE: medication advice and further provider instructions; advised if BP spikes or becomes symptomatic [listed on message] to call office immediately/SLS

## 2013-10-14 ENCOUNTER — Telehealth: Payer: Self-pay

## 2013-10-14 DIAGNOSIS — E119 Type 2 diabetes mellitus without complications: Secondary | ICD-10-CM

## 2013-10-14 NOTE — Telephone Encounter (Signed)
Pt stated that " its time to reorder his Caduet and he didn't know if he needs a stronger Rx or stay with what he has until his visit on the 26th."/LDM

## 2013-10-28 ENCOUNTER — Ambulatory Visit: Payer: 59 | Admitting: Physician Assistant

## 2013-10-29 ENCOUNTER — Ambulatory Visit: Payer: 59 | Admitting: Physician Assistant

## 2013-11-03 ENCOUNTER — Encounter: Payer: Self-pay | Admitting: Physician Assistant

## 2013-11-03 ENCOUNTER — Encounter: Payer: Self-pay | Admitting: *Deleted

## 2013-11-03 ENCOUNTER — Ambulatory Visit (INDEPENDENT_AMBULATORY_CARE_PROVIDER_SITE_OTHER): Payer: BC Managed Care – PPO | Admitting: Physician Assistant

## 2013-11-03 ENCOUNTER — Other Ambulatory Visit: Payer: Self-pay | Admitting: Physician Assistant

## 2013-11-03 VITALS — BP 120/70 | HR 80 | Temp 97.8°F | Wt 289.0 lb

## 2013-11-03 DIAGNOSIS — J0100 Acute maxillary sinusitis, unspecified: Secondary | ICD-10-CM | POA: Insufficient documentation

## 2013-11-03 DIAGNOSIS — R7309 Other abnormal glucose: Secondary | ICD-10-CM

## 2013-11-03 DIAGNOSIS — E119 Type 2 diabetes mellitus without complications: Secondary | ICD-10-CM

## 2013-11-03 DIAGNOSIS — R739 Hyperglycemia, unspecified: Secondary | ICD-10-CM

## 2013-11-03 DIAGNOSIS — I10 Essential (primary) hypertension: Secondary | ICD-10-CM

## 2013-11-03 DIAGNOSIS — E785 Hyperlipidemia, unspecified: Secondary | ICD-10-CM

## 2013-11-03 LAB — COMPREHENSIVE METABOLIC PANEL
ALK PHOS: 77 U/L (ref 39–117)
ALT: 21 U/L (ref 0–53)
AST: 19 U/L (ref 0–37)
Albumin: 4.2 g/dL (ref 3.5–5.2)
BUN: 14 mg/dL (ref 6–23)
CALCIUM: 9.1 mg/dL (ref 8.4–10.5)
CHLORIDE: 101 meq/L (ref 96–112)
CO2: 29 mEq/L (ref 19–32)
CREATININE: 1.1 mg/dL (ref 0.4–1.5)
GFR: 93.82 mL/min (ref >60.00)
Glucose, Bld: 109 mg/dL — ABNORMAL HIGH (ref 70–99)
POTASSIUM: 3.9 meq/L (ref 3.5–5.1)
Sodium: 138 mEq/L (ref 135–145)
Total Bilirubin: 0.7 mg/dL (ref 0.2–1.2)
Total Protein: 8.1 g/dL (ref 6.0–8.3)

## 2013-11-03 LAB — HEMOGLOBIN A1C
HEMOGLOBIN A1C: 6.7 % — AB (ref 4.6–6.5)
Hgb A1c MFr Bld: 6.7 % — ABNORMAL HIGH (ref 4.6–6.5)

## 2013-11-03 MED ORDER — SULFAMETHOXAZOLE-TMP DS 800-160 MG PO TABS: 1.0000 | ORAL_TABLET | Freq: Two times a day (BID) | ORAL | Status: DC

## 2013-11-03 MED ORDER — LOVASTATIN 20 MG PO TABS: 20.0000 mg | ORAL_TABLET | Freq: Every day | ORAL | Status: DC

## 2013-11-03 MED ORDER — AMLODIPINE BESYLATE 5 MG PO TABS: 5.0000 mg | ORAL_TABLET | Freq: Every day | ORAL | Status: DC

## 2013-11-03 NOTE — Telephone Encounter (Signed)
This has been addressed already by Mackey Birchwood CMA.

## 2013-11-03 NOTE — Assessment & Plan Note (Signed)
BP normotensive.  Continue current regimen for now.  If patient unable to renew eligibility for free Caduet, he is to start taking Rx given for amlodipine 5 mg daily.  Continue dietary changes.

## 2013-11-03 NOTE — Patient Instructions (Signed)
Please call concerning the Caduet discount program.  If they are not going to continue your discount, then we are switching to amlodipine 5 mg and Lovastatin 20 mg as this will be cheaper than paying out-of-pocket for Caduet. Please continue Metformin as directed.  I will call you with your lab results.  For sinus infection, please take antibiotic as directed.  Continue Flonase.  Use Plain Mucinex.  Increase your fluid intake.  Rest. Place a humidifier in the bedroom.  Call or return to clinic if needed.  Follow-up in 6 months.  Sinusitis Sinusitis is redness, soreness, and inflammation of the paranasal sinuses. Paranasal sinuses are air pockets within the bones of your face (beneath the eyes, the middle of the forehead, or above the eyes). In healthy paranasal sinuses, mucus is able to drain out, and air is able to circulate through them by way of your nose. However, when your paranasal sinuses are inflamed, mucus and air can become trapped. This can allow bacteria and other germs to grow and cause infection. Sinusitis can develop quickly and last only a short time (acute) or continue over a long period (chronic). Sinusitis that lasts for more than 12 weeks is considered chronic.  CAUSES  Causes of sinusitis include:  Allergies.  Structural abnormalities, such as displacement of the cartilage that separates your nostrils (deviated septum), which can decrease the air flow through your nose and sinuses and affect sinus drainage.  Functional abnormalities, such as when the small hairs (cilia) that line your sinuses and help remove mucus do not work properly or are not present. SIGNS AND SYMPTOMS  Symptoms of acute and chronic sinusitis are the same. The primary symptoms are pain and pressure around the affected sinuses. Other symptoms include:  Upper toothache.  Earache.  Headache.  Bad breath.  Decreased sense of smell and taste.  A cough, which worsens when you are lying  flat.  Fatigue.  Fever.  Thick drainage from your nose, which often is green and may contain pus (purulent).  Swelling and warmth over the affected sinuses. DIAGNOSIS  Your health care provider will perform a physical exam. During the exam, your health care provider may:  Look in your nose for signs of abnormal growths in your nostrils (nasal polyps).  Tap over the affected sinus to check for signs of infection.  View the inside of your sinuses (endoscopy) using an imaging device that has a light attached (endoscope). If your health care provider suspects that you have chronic sinusitis, one or more of the following tests may be recommended:  Allergy tests.  Nasal culture. A sample of mucus is taken from your nose, sent to a lab, and screened for bacteria.  Nasal cytology. A sample of mucus is taken from your nose and examined by your health care provider to determine if your sinusitis is related to an allergy. TREATMENT  Most cases of acute sinusitis are related to a viral infection and will resolve on their own within 10 days. Sometimes medicines are prescribed to help relieve symptoms (pain medicine, decongestants, nasal steroid sprays, or saline sprays).  However, for sinusitis related to a bacterial infection, your health care provider will prescribe antibiotic medicines. These are medicines that will help kill the bacteria causing the infection.  Rarely, sinusitis is caused by a fungal infection. In theses cases, your health care provider will prescribe antifungal medicine. For some cases of chronic sinusitis, surgery is needed. Generally, these are cases in which sinusitis recurs more than 3 times  per year, despite other treatments. HOME CARE INSTRUCTIONS   Drink plenty of water. Water helps thin the mucus so your sinuses can drain more easily.  Use a humidifier.  Inhale steam 3 to 4 times a day (for example, sit in the bathroom with the shower running).  Apply a warm,  moist washcloth to your face 3 to 4 times a day, or as directed by your health care provider.  Use saline nasal sprays to help moisten and clean your sinuses.  Take medicines only as directed by your health care provider.  If you were prescribed either an antibiotic or antifungal medicine, finish it all even if you start to feel better. SEEK IMMEDIATE MEDICAL CARE IF:  You have increasing pain or severe headaches.  You have nausea, vomiting, or drowsiness.  You have swelling around your face.  You have vision problems.  You have a stiff neck.  You have difficulty breathing. MAKE SURE YOU:   Understand these instructions.  Will watch your condition.  Will get help right away if you are not doing well or get worse. Document Released: 02/19/2005 Document Revised: 07/06/2013 Document Reviewed: 03/06/2011 [facility] Patient Information 2015 [place], [place]. This information is not intended to replace advice given to you by your health care provider. Make sure you discuss any questions you have with your health care provider.

## 2013-11-03 NOTE — Progress Notes (Signed)
Patient presents to clinic today for 43-monthfollow-up of Hypertension, Hyperlipidemia, and DM II.  Patient endorses taking Metformin 1000 mg twice daily.  Endorses AM fasting glucose in range of 90-120.  Is making better dietary choices. Has increased exercise. Has been taking Caduet given by previous physician and is requesting refill of medication.  Was previously receiving medication through a discount program but has to reapply. BP normotensive in clinic today.    Patient c/o sinus pressure, sinus pain, purulent rhinorrhea and fatigue over the past 1.5 weeks.  Denies ear pain, tooth pain or fever at present.  Denies cough, SOB or pleuritic chest pain.  Past Medical History  Diagnosis Date  . Hypertension   . High cholesterol   . DM (diabetes mellitus)     type 2  . Low testosterone     Current Outpatient Prescriptions on File Prior to Visit  Medication Sig Dispense Refill  . acetaminophen (TYLENOL) 325 MG tablet Take 650 mg by mouth every 6 (six) hours as needed (pain).      . Blood Glucose Monitoring Suppl (RELION CONFIRM GLUCOSE MONITOR) W/DEVICE KIT Use As Directed to Test Blood Glucose Once Daily Dx: 250.00  1 kit  0  . metFORMIN (GLUCOPHAGE) 1000 MG tablet Take 1 tablet (1,000 mg total) by mouth 2 (two) times daily with a meal.  180 tablet  3  . multivitamin (ONE-A-DAY MEN'S) TABS tablet Take 1 tablet by mouth daily.      . NON FORMULARY EPIQ Testosterone Amplifier      . Pseudoephedrine-Ibuprofen (ADVIL COLD & SINUS LIQUI-GELS PO) Take 1 capsule by mouth daily as needed (flu-like symptoms).      . RELION LANCETS STANDARD 21G MISC Use As Directed to Test Blood Glucose Once Daily  100 each  0  . terbinafine (LAMISIL) 250 MG tablet Take 250 mg by mouth daily.       No current facility-administered medications on file prior to visit.    No Known Allergies  Family History  Problem Relation Age of Onset  . Aneurysm Mother 392   Deceased  . Healthy Father     Living  . Prostate  cancer Paternal Uncle   . Healthy Daughter     x3    History   Social History  . Marital Status: Single    Spouse Name: N/A    Number of Children: N/A  . Years of Education: N/A   Social History Main Topics  . Smoking status: Former Smoker -- 0.50 packs/day for 15 years    Quit date: 04/17/2005  . Smokeless tobacco: Never Used  . Alcohol Use: No  . Drug Use: No  . Sexual Activity: None   Other Topics Concern  . None   Social History Narrative  . None   Review of Systems - See HPI.  All other ROS are negative.  BP 120/70  Pulse 80  Temp(Src) 97.8 F (36.6 C)  Wt 289 lb (131.09 kg)  SpO2 98%  Physical Exam  Constitutional: He is oriented to person, place, and time and well-developed, well-nourished, and in no distress.  HENT:  Head: Normocephalic and atraumatic.  Right Ear: Tympanic membrane, external ear and ear canal normal.  Left Ear: Tympanic membrane, external ear and ear canal normal.  Nose: Mucosal edema and rhinorrhea present. Right sinus exhibits maxillary sinus tenderness and frontal sinus tenderness. Left sinus exhibits maxillary sinus tenderness. Left sinus exhibits no frontal sinus tenderness.  Mouth/Throat: Uvula is midline, oropharynx is clear  and moist and mucous membranes are normal. No oropharyngeal exudate.  Eyes: Conjunctivae are normal. Pupils are equal, round, and reactive to light.  Neck: Neck supple. No thyromegaly present.  Cardiovascular: Normal rate, regular rhythm, normal heart sounds and intact distal pulses.   Pulmonary/Chest: Effort normal and breath sounds normal. No respiratory distress. He has no wheezes. He has no rales. He exhibits no tenderness.  Lymphadenopathy:    He has no cervical adenopathy.  Neurological: He is alert and oriented to person, place, and time.  Skin: Skin is warm and dry. No rash noted.  Psychiatric: Affect normal.   Assessment/Plan: Type II or unspecified type diabetes mellitus without mention of  complication, not stated as uncontrolled Repeat A1C and BMP.  Continue current regimen for now.  Continue once daily glucose monitoring.   Hyperlipidemia Patient to check on discount program renewal for Caduet.  If not eligible,patient to begin taking Mevacor 20 mg.    Essential hypertension, benign BP normotensive.  Continue current regimen for now.  If patient unable to renew eligibility for free Caduet, he is to start taking Rx given for amlodipine 5 mg daily.  Continue dietary changes.  Acute maxillary sinusitis Rx Bactrim.  Continue Flonase.  Increase fluid intake.  Rest. Plain Mucinex.  Place humidifier in bedroom.

## 2013-11-03 NOTE — Addendum Note (Signed)
Addended by: Modena Morrow D on: 11/03/2013 10:50 AM   Modules accepted: Orders

## 2013-11-03 NOTE — Assessment & Plan Note (Signed)
Patient to check on discount program renewal for Caduet.  If not eligible,patient to begin taking Mevacor 20 mg.

## 2013-11-03 NOTE — Assessment & Plan Note (Signed)
Rx Bactrim.  Continue Flonase.  Increase fluid intake.  Rest. Plain Mucinex.  Place humidifier in bedroom.

## 2013-11-03 NOTE — Assessment & Plan Note (Signed)
Repeat A1C and BMP.  Continue current regimen for now.  Continue once daily glucose monitoring.

## 2013-11-03 NOTE — Progress Notes (Signed)
Pre visit review using our clinic review tool, if applicable. No additional management support is needed unless otherwise documented below in the visit note. 

## 2013-11-04 LAB — BASIC METABOLIC PANEL
BUN: 14 mg/dL (ref 6–23)
CO2: 23 mEq/L (ref 19–32)
Calcium: 9.1 mg/dL (ref 8.4–10.5)
Chloride: 101 mEq/L (ref 96–112)
Creatinine, Ser: 1.1 mg/dL (ref 0.4–1.5)
GFR: 89.97 mL/min (ref >60.00)
Glucose, Bld: 101 mg/dL — ABNORMAL HIGH (ref 70–99)
Potassium: 4.1 mEq/L (ref 3.5–5.1)
SODIUM: 139 meq/L (ref 135–145)

## 2013-11-06 ENCOUNTER — Other Ambulatory Visit: Payer: Self-pay | Admitting: *Deleted

## 2013-11-06 DIAGNOSIS — E119 Type 2 diabetes mellitus without complications: Secondary | ICD-10-CM

## 2013-11-06 MED ORDER — METFORMIN HCL 500 MG PO TABS: ORAL_TABLET | ORAL | Status: DC

## 2013-11-06 MED ORDER — METFORMIN HCL 1000 MG PO TABS: ORAL_TABLET | ORAL | Status: DC

## 2014-01-12 ENCOUNTER — Telehealth: Payer: Self-pay | Admitting: Physician Assistant

## 2014-01-12 DIAGNOSIS — E119 Type 2 diabetes mellitus without complications: Secondary | ICD-10-CM

## 2014-01-12 MED ORDER — METFORMIN HCL 1000 MG PO TABS: ORAL_TABLET | ORAL | Status: DC

## 2014-01-12 NOTE — Telephone Encounter (Signed)
Pt is needing new rx for metFORMIN (GLUCOPHAGE) 1000 MG tablet, metFORMIN (GLUCOPHAGE) 500 MG tablet , and amLODipine (NORVASC) 5 MG tablet , also pt states he now has new insurance had cody can go ahead and send in a rx for caduet as well. Send to [facility] on [address].

## 2014-01-12 NOTE — Telephone Encounter (Signed)
LMOM with contact name and number for return call RE: Hypertension medication regimen per provider instructions & also that Metformin should be 1000 mg BID and has been sent to pharmacy/SLS

## 2014-01-13 NOTE — Telephone Encounter (Signed)
Patient returned phone call. Best # [phone] before 3.

## 2014-01-13 NOTE — Telephone Encounter (Signed)
LMOM [2nd] with contact name and number for return call RE: changes to medication regimen with further provider instructions/SLS

## 2014-01-13 NOTE — Telephone Encounter (Signed)
Pt returned call advised pt to keep he's phone near. Please call [phone] mobile

## 2014-01-15 MED ORDER — IBUPROFEN 800 MG PO TABS: 800.0000 mg | ORAL_TABLET | Freq: Three times a day (TID) | ORAL | Status: DC | PRN

## 2014-01-15 NOTE — Telephone Encounter (Signed)
Done

## 2014-01-15 NOTE — Telephone Encounter (Signed)
Patient informed, understood & agreed to stay on Amlodipine/SLS  Pt states that he spoke with you RE: Ibuprofen 800 mg to be sent to pharmacy if needed for pain; pt is now requesting to have this Rx sent to his pharmacy/SLS

## 2014-02-01 ENCOUNTER — Telehealth: Payer: Self-pay | Admitting: *Deleted

## 2014-02-01 NOTE — Telephone Encounter (Signed)
Patient dropped off form for [place] for patient assistance for cost of Caduet 5/10. Form filled out as much as possible and forwarded to [place]. JG//CMA

## 2014-02-02 DIAGNOSIS — Z7689 Persons encountering health services in other specified circumstances: Secondary | ICD-10-CM

## 2014-02-03 NOTE — Telephone Encounter (Signed)
Completed paperwork faxed to [place] at [phone]. Called and informed patient that originals are at the front desk for him to pick up. Copy sent for scanning. JG//CMA

## 2014-02-16 ENCOUNTER — Telehealth: Payer: Self-pay | Admitting: Physician Assistant

## 2014-02-16 NOTE — Telephone Encounter (Signed)
Caller name:Sinclair, Kanin Relation to ZM:CEYE Call back number:[phone] Pharmacy:  Reason for call: pt is wanting to know the status of his rx caduet, pt states it was approved and was being shipped to our office and where would he come to pick it up once it arrives.

## 2014-02-16 NOTE — Telephone Encounter (Signed)
Spoke with patient and informed that we will call him for pick-up when medication arrives at office; pt understood & agreed/SLS

## 2014-02-24 NOTE — Telephone Encounter (Signed)
Received fax from [employer] stating that patient has been accepted into program until 03/05/2015. JG//CMA

## 2014-02-25 NOTE — Telephone Encounter (Signed)
Pt medication has arrived.  He was called and made aware.  Pt stated he would pick up meds on Monday.

## 2014-03-10 ENCOUNTER — Telehealth: Payer: Self-pay | Admitting: Physician Assistant

## 2014-03-10 MED ORDER — FLUTICASONE PROPIONATE 50 MCG/ACT NA SUSP: 2.0000 | Freq: Every day | NASAL | Status: DC | PRN

## 2014-03-10 NOTE — Telephone Encounter (Signed)
Rx request to pharmacy, per provider instructions 09.01.15 OV to continue medication/SLS

## 2014-03-10 NOTE — Telephone Encounter (Signed)
Caller name: Juhnke, Damondre Relation to pt: self  Call back number: [phone] Pharmacy:  [facility] [place], [place]. [phone] (Phone     Reason for call:  Pt requesting a refill flonase

## 2014-03-24 ENCOUNTER — Encounter: Payer: Self-pay | Admitting: *Deleted

## 2014-03-24 ENCOUNTER — Encounter: Payer: Self-pay | Admitting: Physician Assistant

## 2014-03-24 ENCOUNTER — Ambulatory Visit (INDEPENDENT_AMBULATORY_CARE_PROVIDER_SITE_OTHER): Payer: BLUE CROSS/BLUE SHIELD | Admitting: Physician Assistant

## 2014-03-24 VITALS — BP 150/85 | HR 72 | Temp 98.4°F | Resp 16 | Ht 70.0 in | Wt 303.0 lb

## 2014-03-24 DIAGNOSIS — E119 Type 2 diabetes mellitus without complications: Secondary | ICD-10-CM

## 2014-03-24 DIAGNOSIS — I10 Essential (primary) hypertension: Secondary | ICD-10-CM

## 2014-03-24 MED ORDER — METFORMIN HCL 1000 MG PO TABS: ORAL_TABLET | ORAL | Status: DC

## 2014-03-24 MED ORDER — GLIMEPIRIDE 2 MG PO TABS: 2.0000 mg | ORAL_TABLET | Freq: Every day | ORAL | Status: DC

## 2014-03-24 NOTE — Patient Instructions (Signed)
Please continue medications as directed.  Be more consistent with meals.  The difference in these morning blood sugars are likely a result of heavier meals.  Also the stress you have been dealing with is a contributing factor.  Stay active to help relieve stress!   Follow-up with me in March.

## 2014-03-24 NOTE — Progress Notes (Signed)
Pre visit review using our clinic review tool, if applicable. No additional management support is needed unless otherwise documented below in the visit note/SLS  

## 2014-03-28 NOTE — Progress Notes (Signed)
Patient presents to clinic today to discuss recent increase in his fasting glucose. Patient is currently on metformin 1000 mg twice daily. Has Amaryl 2 mg to take if fasting blood sugar greater than 150.  Endorses taking medicines as directed, but has noted increased use of his Amaryl. Denies polyuria, polydipsia or polyphagia. Denies neuropathic symptoms. Is trying to stay active but endorses poor diet.  Past Medical History  Diagnosis Date  . Hypertension   . High cholesterol   . DM (diabetes mellitus)     type 2  . Low testosterone     Current Outpatient Prescriptions on File Prior to Visit  Medication Sig Dispense Refill  . acetaminophen (TYLENOL) 325 MG tablet Take 650 mg by mouth every 6 (six) hours as needed (pain).    . Blood Glucose Monitoring Suppl (RELION CONFIRM GLUCOSE MONITOR) W/DEVICE KIT Use As Directed to Test Blood Glucose Once Daily Dx: 250.00 1 kit 0  . fluticasone (FLONASE) 50 MCG/ACT nasal spray Place 2 sprays into both nostrils daily as needed for allergies or rhinitis. 16 g 2  . ibuprofen (ADVIL,MOTRIN) 800 MG tablet Take 1 tablet (800 mg total) by mouth every 8 (eight) hours as needed. 90 tablet 0  . multivitamin (ONE-A-DAY MEN'S) TABS tablet Take 1 tablet by mouth daily.    . NON FORMULARY EPIQ Testosterone Amplifier    . Pseudoephedrine-Ibuprofen (ADVIL COLD & SINUS LIQUI-GELS PO) Take 1 capsule by mouth daily as needed (flu-like symptoms).    Edwin [name] LANCETS STANDARD 21G MISC Use As Directed to Test Blood Glucose Once Daily 100 each 0   No current facility-administered medications on file prior to visit.    No Known Allergies  Family History  Problem Relation Age of Onset  . Aneurysm Mother 42    Deceased  . Healthy Father     Living  . Prostate cancer Paternal Uncle   . Healthy Daughter     x3    History   Social History  . Marital Status: Single    Spouse Name: N/A    Number of Children: N/A  . Years of Education: N/A   Social History  Main Topics  . Smoking status: Former Smoker -- 0.50 packs/day for 15 years    Quit date: 04/17/2005  . Smokeless tobacco: Never Used  . Alcohol Use: No  . Drug Use: No  . Sexual Activity: None   Other Topics Concern  . None   Social History Narrative   Review of Systems - See HPI.  All other ROS are negative.  BP 150/85 mmHg  Pulse 72  Temp(Src) 98.4 F (36.9 C) (Oral)  Resp 16  Ht 5' 10"  (1.778 m)  Wt 303 lb (137.44 kg)  BMI 43.48 kg/m2  SpO2 100%  Physical Exam  Constitutional: He is oriented to person, place, and time and well-developed, well-nourished, and in no distress.  HENT:  Head: Normocephalic and atraumatic.  Cardiovascular: Normal rate, regular rhythm, normal heart sounds and intact distal pulses.   Pulmonary/Chest: Effort normal and breath sounds normal. No respiratory distress. He has no wheezes. He has no rales. He exhibits no tenderness.  Neurological: He is alert and oriented to person, place, and time.  Skin: Skin is warm and dry. No rash noted.  Psychiatric: Affect normal.  Vitals reviewed.  Assessment/Plan: Essential hypertension, benign BP elevated in clinic today. Asymptomatic. Patient has not taken his blood pressure medicine today. Medication refill. Importance of medication compliance re-iterated to patient. Will continue  to routinely monitor blood pressure at visits.   Diabetes mellitus type II, controlled Patient endorses good compliance with medication. Suspect labile fasting blood sugars are due to inconsistency with evening diet. Carbohydrate monitoring and calorie counting reviewed with patient. Diabetic diet handout given. Patient instructed to check a.m. fasting glucose and daily at bedtime glucose. Record findings in a journal. Bring to follow-up in one month.

## 2014-03-28 NOTE — Assessment & Plan Note (Signed)
Patient endorses good compliance with medication. Suspect labile fasting blood sugars are due to inconsistency with evening diet. Carbohydrate monitoring and calorie counting reviewed with patient. Diabetic diet handout given. Patient instructed to check a.m. fasting glucose and daily at bedtime glucose. Record findings in a journal. Bring to follow-up in one month.

## 2014-03-28 NOTE — Assessment & Plan Note (Signed)
BP elevated in clinic today. Asymptomatic. Patient has not taken his blood pressure medicine today. Medication refill. Importance of medication compliance re-iterated to patient. Will continue to routinely monitor blood pressure at visits.

## 2014-05-04 ENCOUNTER — Ambulatory Visit: Payer: 59 | Admitting: Physician Assistant

## 2014-06-01 ENCOUNTER — Telehealth: Payer: Self-pay | Admitting: Physician Assistant

## 2014-06-01 MED ORDER — AMLODIPINE-ATORVASTATIN 5-10 MG PO TABS: 1.0000 | ORAL_TABLET | Freq: Every day | ORAL | Status: DC

## 2014-06-01 NOTE — Telephone Encounter (Signed)
[employer] and placed order for [employer], will ship in 7-10 business days after processing/SLS [facility] with contact name and number to inform patient/sls

## 2014-06-01 NOTE — Telephone Encounter (Signed)
Caller name: Edwin [name], Edwin [name] Relation to pt: self  Call back number: [phone]   Reason for call:  Pt said he contacted [facility] and they informed him the office has to call to order medication as per previous conversation pt states he thanks you for all your help. Contact # [place] [phone]

## 2014-06-01 NOTE — Telephone Encounter (Signed)
Caller name: Roddie Relation to pt: self Call back number: [phone] Pharmacy:   Reason for call:   Requesting amLODipine-atorvastatin (CADUET) refill. Patient states that we need to call [place] and med will be delivered to [place].

## 2014-06-01 NOTE — Telephone Encounter (Signed)
Spoke with patient and informed him that we do not contact the [employer] on Patient Assistance orders; he will need to contact [place] and have them send the request to our office and/or his pharmacy, as to where they will send the medication; that we must a paper copy of the order to scan in patient's chart. Patient understood & agreed/SLS

## 2014-06-04 ENCOUNTER — Ambulatory Visit (INDEPENDENT_AMBULATORY_CARE_PROVIDER_SITE_OTHER): Payer: BLUE CROSS/BLUE SHIELD | Admitting: Physician Assistant

## 2014-06-04 ENCOUNTER — Encounter: Payer: Self-pay | Admitting: Physician Assistant

## 2014-06-04 VITALS — BP 126/78 | HR 80 | Temp 98.6°F | Resp 16 | Ht 70.0 in | Wt 301.0 lb

## 2014-06-04 DIAGNOSIS — E119 Type 2 diabetes mellitus without complications: Secondary | ICD-10-CM

## 2014-06-04 DIAGNOSIS — E291 Testicular hypofunction: Secondary | ICD-10-CM

## 2014-06-04 DIAGNOSIS — E785 Hyperlipidemia, unspecified: Secondary | ICD-10-CM

## 2014-06-04 DIAGNOSIS — I10 Essential (primary) hypertension: Secondary | ICD-10-CM

## 2014-06-04 DIAGNOSIS — R7989 Other specified abnormal findings of blood chemistry: Secondary | ICD-10-CM

## 2014-06-04 LAB — LIPID PANEL
CHOLESTEROL: 170 mg/dL (ref 0–200)
HDL: 41.8 mg/dL (ref >39.00)
LDL Cholesterol: 113 mg/dL — ABNORMAL HIGH (ref 0–99)
NonHDL: 128.2
TRIGLYCERIDES: 75 mg/dL (ref 0.0–149.0)
Total CHOL/HDL Ratio: 4
VLDL: 15 mg/dL (ref 0.0–40.0)

## 2014-06-04 LAB — COMPREHENSIVE METABOLIC PANEL
ALBUMIN: 4.3 g/dL (ref 3.5–5.2)
ALT: 31 U/L (ref 0–53)
AST: 22 U/L (ref 0–37)
Alkaline Phosphatase: 73 U/L (ref 39–117)
BILIRUBIN TOTAL: 0.4 mg/dL (ref 0.2–1.2)
BUN: 12 mg/dL (ref 6–23)
CALCIUM: 10.1 mg/dL (ref 8.4–10.5)
CO2: 32 mEq/L (ref 19–32)
Chloride: 103 mEq/L (ref 96–112)
Creatinine, Ser: 0.98 mg/dL (ref 0.40–1.50)
GFR: 104.7 mL/min (ref >60.00)
GLUCOSE: 123 mg/dL — AB (ref 70–99)
POTASSIUM: 5 meq/L (ref 3.5–5.1)
Sodium: 137 mEq/L (ref 135–145)
TOTAL PROTEIN: 7.8 g/dL (ref 6.0–8.3)

## 2014-06-04 LAB — MICROALBUMIN / CREATININE URINE RATIO
Creatinine,U: 62.2 mg/dL
Microalb Creat Ratio: 1.1 mg/g (ref 0.0–30.0)
Microalb, Ur: <0.7 mg/dL (ref 0.0–1.9)

## 2014-06-04 LAB — TESTOSTERONE: TESTOSTERONE: 255.65 ng/dL — AB (ref 300.00–890.00)

## 2014-06-04 LAB — HEMOGLOBIN A1C: HEMOGLOBIN A1C: 7 % — AB (ref 4.6–6.5)

## 2014-06-04 MED ORDER — METFORMIN HCL 1000 MG PO TABS: ORAL_TABLET | ORAL | Status: DC

## 2014-06-04 NOTE — Assessment & Plan Note (Signed)
Will obtain fasting lipid panel today.

## 2014-06-04 NOTE — Patient Instructions (Signed)
Please continue medications as directed. Restart your diet as this will stabilize blood sugars. I will call you with your results.  We will treat for low testosterone based on results.  Follow-up in 6 months for a CPE.  Diabetes and Exercise Exercising regularly is important. It is not just about losing weight. It has many health benefits, such as:  Improving your overall fitness, flexibility, and endurance.  Increasing your bone density.  Helping with weight control.  Decreasing your body fat.  Increasing your muscle strength.  Reducing stress and tension.  Improving your overall health. People with diabetes who exercise gain additional benefits because exercise:  Reduces appetite.  Improves the body's use of blood sugar (glucose).  Helps lower or control blood glucose.  Decreases blood pressure.  Helps control blood lipids (such as cholesterol and triglycerides).  Improves the body's use of the hormone insulin by:  Increasing the body's insulin sensitivity.  Reducing the body's insulin needs.  Decreases the risk for heart disease because exercising:  Lowers cholesterol and triglycerides levels.  Increases the levels of good cholesterol (such as high-density lipoproteins [HDL]) in the body.  Lowers blood glucose levels. YOUR ACTIVITY PLAN  Choose an activity that you enjoy and set realistic goals. Your health care provider or diabetes educator can help you make an activity plan that works for you. Exercise regularly as directed by your health care provider. This includes:  Performing resistance training twice a week such as push-ups, sit-ups, lifting weights, or using resistance bands.  Performing 150 minutes of cardio exercises each week such as walking, running, or playing sports.  Staying active and spending no more than 90 minutes at one time being inactive. Even short bursts of exercise are good for you. Three 10-minute sessions spread throughout the day  are just as beneficial as a single 30-minute session. Some exercise ideas include:  Taking the dog for a walk.  Taking the stairs instead of the elevator.  Dancing to your favorite song.  Doing an exercise video.  Doing your favorite exercise with a friend. RECOMMENDATIONS FOR EXERCISING WITH TYPE 1 OR TYPE 2 DIABETES   Check your blood glucose before exercising. If blood glucose levels are greater than 240 mg/dL, check for urine ketones. Do not exercise if ketones are present.  Avoid injecting insulin into areas of the body that are going to be exercised. For example, avoid injecting insulin into:  The arms when playing tennis.  The legs when jogging.  Keep a record of:  Food intake before and after you exercise.  Expected peak times of insulin action.  Blood glucose levels before and after you exercise.  The type and amount of exercise you have done.  Review your records with your health care provider. Your health care provider will help you to develop guidelines for adjusting food intake and insulin amounts before and after exercising.  If you take insulin or oral hypoglycemic agents, watch for signs and symptoms of hypoglycemia. They include:  Dizziness.  Shaking.  Sweating.  Chills.  Confusion.  Drink plenty of water while you exercise to prevent dehydration or heat stroke. Body water is lost during exercise and must be replaced.  Talk to your health care provider before starting an exercise program to make sure it is safe for you. Remember, almost any type of activity is better than none. Document Released: 05/12/2003 Document Revised: 07/06/2013 Document Reviewed: 07/29/2012 [facility] Patient Information 2015 [place], [place]. This information is not intended to replace advice given to  to you by your health care provider. Make sure you discuss any questions you have with your health care provider.  

## 2014-06-04 NOTE — Assessment & Plan Note (Signed)
Weight loss encouraged. Dietary measures discussed. Exercise handout given.

## 2014-06-04 NOTE — Progress Notes (Signed)
Patient presents to clinic today for 6 month follow-up of chronic medical conditions.  Hypertension --  Patient endorses taking medications as directed. Patient denies chest pain, palpitations, lightheadedness, dizziness, vision changes or frequent headaches.  Hyperlipidemia -- Patient endorses takin his Caduet as directed. Denies myalgias.  Has gotten off of his diet but has just restarted.  Diabetes Mellitus II -- Without complications.  Previously well-controlled. Patient taking Metformin as directed.  Fasting CBGs averaging 100-130.  Due for repeat labs and foot exam.  Hypogonadism -- Endorses decreased libido and fatigue.  Is wanting to recheck levels.  Past Medical History  Diagnosis Date  . Hypertension   . High cholesterol   . DM (diabetes mellitus)     type 2  . Low testosterone     Current Outpatient Prescriptions on File Prior to Visit  Medication Sig Dispense Refill  . acetaminophen (TYLENOL) 325 MG tablet Take 650 mg by mouth every 6 (six) hours as needed (pain).    Marland Kitchen amLODipine-atorvastatin (CADUET) 5-10 MG per tablet Take 1 tablet by mouth daily. 30 tablet 3  . Blood Glucose Monitoring Suppl (RELION CONFIRM GLUCOSE MONITOR) W/DEVICE KIT Use As Directed to Test Blood Glucose Once Daily Dx: 250.00 1 kit 0  . fluticasone (FLONASE) 50 MCG/ACT nasal spray Place 2 sprays into both nostrils daily as needed for allergies or rhinitis. 16 g 2  . glimepiride (AMARYL) 2 MG tablet Take 1 tablet (2 mg total) by mouth daily with breakfast. As Needed for Blood Sugars >150 30 tablet 3  . ibuprofen (ADVIL,MOTRIN) 800 MG tablet Take 1 tablet (800 mg total) by mouth every 8 (eight) hours as needed. 90 tablet 0  . multivitamin (ONE-A-DAY MEN'S) TABS tablet Take 1 tablet by mouth daily.    . NON FORMULARY EPIQ Testosterone Amplifier    . Pseudoephedrine-Ibuprofen (ADVIL COLD & SINUS LIQUI-GELS PO) Take 1 capsule by mouth daily as needed (flu-like symptoms).    Daryll Brod LANCETS STANDARD 21G  MISC Use As Directed to Test Blood Glucose Once Daily 100 each 0   No current facility-administered medications on file prior to visit.    No Known Allergies  Family History  Problem Relation Age of Onset  . Aneurysm Mother 56    Deceased  . Healthy Father     Living  . Prostate cancer Paternal Uncle   . Healthy Daughter     x3    History   Social History  . Marital Status: Single    Spouse Name: N/A  . Number of Children: N/A  . Years of Education: N/A   Social History Main Topics  . Smoking status: Former Smoker -- 0.50 packs/day for 15 years    Quit date: 04/17/2005  . Smokeless tobacco: Never Used  . Alcohol Use: No  . Drug Use: No  . Sexual Activity: Not on file   Other Topics Concern  . None   Social History Narrative   Review of Systems - See HPI.  All other ROS are negative.  BP 126/78 mmHg  Pulse 80  Temp(Src) 98.6 F (37 C) (Oral)  Resp 16  Ht 5' 10" (1.778 m)  Wt 301 lb (136.533 kg)  BMI 43.19 kg/m2  SpO2 100%  Physical Exam  Constitutional: He is oriented to person, place, and time and well-developed, well-nourished, and in no distress.  HENT:  Head: Normocephalic and atraumatic.  Neck: Neck supple.  Cardiovascular: Normal rate, regular rhythm, normal heart sounds and intact distal pulses.  Pulmonary/Chest: Effort normal and breath sounds normal. No respiratory distress. He has no wheezes. He has no rales. He exhibits no tenderness.  Neurological: He is alert and oriented to person, place, and time.  Skin: Skin is warm and dry. No rash noted.  Psychiatric: Affect normal.  Vitals reviewed.  Assessment/Plan: Essential hypertension, benign Well controlled at present.  Continue current medication regimen. Weight loss encouraged. Follow-up in 6 months.   Diabetes mellitus type II, controlled Previously well-controlled. Last A1C at 6.7. Diabetic foot exam within normal limits. We'll continue current regimen. Encourage patient to resume his  diet to help with stricter glycemic control. Continue morning fasting blood sugar log. Follow-up in 6 months.   Hyperlipidemia Will obtain fasting lipid panel today.   Low serum testosterone level We'll repeat testosterone level today giving symptoms. We'll treat based on results.   Morbid obesity Weight loss encouraged. Dietary measures discussed. Exercise handout given.

## 2014-06-04 NOTE — Assessment & Plan Note (Signed)
Previously well-controlled. Last A1C at 6.7. Diabetic foot exam within normal limits. We'll continue current regimen. Encourage patient to resume his diet to help with stricter glycemic control. Continue morning fasting blood sugar log. Follow-up in 6 months.

## 2014-06-04 NOTE — Progress Notes (Signed)
Pre visit review using our clinic review tool, if applicable. No additional management support is needed unless otherwise documented below in the visit note/SLS  

## 2014-06-04 NOTE — Assessment & Plan Note (Signed)
We'll repeat testosterone level today giving symptoms. We'll treat based on results.

## 2014-06-04 NOTE — Assessment & Plan Note (Signed)
Well controlled at present.  Continue current medication regimen. Weight loss encouraged. Follow-up in 6 months.

## 2014-06-07 ENCOUNTER — Telehealth: Payer: Self-pay | Admitting: *Deleted

## 2014-06-07 ENCOUNTER — Telehealth: Payer: Self-pay | Admitting: Physician Assistant

## 2014-06-07 NOTE — Telephone Encounter (Signed)
Copy of Labs mailed to patient/SLS

## 2014-06-07 NOTE — Telephone Encounter (Addendum)
Caller name: Roza, Eliav Relation to pt: self  Call back number: [phone] Pharmacy:  Reason for call:  Pt requesting most recent results please mail to home

## 2014-06-07 NOTE — Telephone Encounter (Signed)
Received patient's Caduet via [employer] Patient Assistance [x3 bottles]; called patient and informed Rx ready for p/u during regular business hours, understood & agreed. Note on medication bag to have patient sign for medication in Rx [controlled] book, so that we have documentation that Rx was picked-up/SLS amLODipine-atorvastatin (CADUET) 5-10 MG per tablet [[id number]]      Order Details    Dose: 1 tablet Route: Oral   Order Comments:   [employer] Patient Assistance ID [id number], Order confirmation [id number]       Dispense Quantity:  30 tablet Refills:  3 Fills Remaining:  3               Written Date:  06/01/14 Expiration Date:  06/01/15    Start Date:  06/01/14 End Date:  --    Ordering Provider:  Brunetta Jeans, PA-C Authorizing Provider:  Brunetta Jeans, PA-C Ordering User:  Rockwell Germany, CMA                 Original Order:  amLODipine-atorvastatin (CADUET) 5-10 MG per tablet [[id number]]

## 2014-07-30 ENCOUNTER — Telehealth: Payer: Self-pay | Admitting: Physician Assistant

## 2014-07-30 NOTE — Telephone Encounter (Signed)
Caller name: Anthoney Relation to pt: self Call back number: [phone] Pharmacy:  Reason for call:   Patient would like a callback. He states that his bp and sugar keep going up and down and wants to know how he can control this better. He states that he has been taking his medication. 126/91 was last bp reading.

## 2014-07-30 NOTE — Telephone Encounter (Signed)
Patient states that his CBG's are normally 119-126 and Monday his CBG was 168.  He reported going to [facility] the night before.  He took his glimepiride and his CBG after an hour was 80.  He states that this makes him feel very jittery.    Per Elyn Aquas, PA, patient should reduce glimepiride to 1mg  (1/2 tablet) PRN for CBG's above 150 (noted this change in his medication list).  Notified patient to watch carb and sugar intake as well as sodium intake for blood pressures.  He reports a blood pressure of 123/90 and is not having any symptoms such as headache, chest tightness, or blurred vision.

## 2014-08-03 ENCOUNTER — Encounter: Payer: Self-pay | Admitting: Physician Assistant

## 2014-08-03 ENCOUNTER — Ambulatory Visit (INDEPENDENT_AMBULATORY_CARE_PROVIDER_SITE_OTHER): Payer: BLUE CROSS/BLUE SHIELD | Admitting: Physician Assistant

## 2014-08-03 VITALS — BP 141/76 | HR 73 | Temp 98.0°F | Ht 70.0 in | Wt 303.2 lb

## 2014-08-03 DIAGNOSIS — R350 Frequency of micturition: Secondary | ICD-10-CM

## 2014-08-03 DIAGNOSIS — J019 Acute sinusitis, unspecified: Secondary | ICD-10-CM | POA: Diagnosis not present

## 2014-08-03 DIAGNOSIS — B9689 Other specified bacterial agents as the cause of diseases classified elsewhere: Secondary | ICD-10-CM | POA: Insufficient documentation

## 2014-08-03 LAB — POCT URINALYSIS DIPSTICK
Bilirubin, UA: NEGATIVE
Glucose, UA: NEGATIVE
Ketones, UA: NEGATIVE
Leukocytes, UA: NEGATIVE
NITRITE UA: NEGATIVE
PH UA: 6
RBC UA: NEGATIVE
Spec Grav, UA: 1.015

## 2014-08-03 MED ORDER — AMOXICILLIN-POT CLAVULANATE 875-125 MG PO TABS: 1.0000 | ORAL_TABLET | Freq: Two times a day (BID) | ORAL | Status: DC

## 2014-08-03 NOTE — Assessment & Plan Note (Signed)
Rx Augmentin.  Increase fluids.  Rest.  Saline nasal spray.  Probiotic.  Mucinex as directed.  Humidifier in bedroom. Resume Flonase.  Call or return to clinic if symptoms are not improving.

## 2014-08-03 NOTE — Patient Instructions (Signed)
Please take antibiotic as directed.  Increase fluid intake.  Use Saline nasal spray.  Take a daily multivitamin. Continue Flonase.  Place a humidifier in the bedroom.  Please call or return clinic if symptoms are not improving.  Sinusitis Sinusitis is redness, soreness, and swelling (inflammation) of the paranasal sinuses. Paranasal sinuses are air pockets within the bones of your face (beneath the eyes, the middle of the forehead, or above the eyes). In healthy paranasal sinuses, mucus is able to drain out, and air is able to circulate through them by way of your nose. However, when your paranasal sinuses are inflamed, mucus and air can become trapped. This can allow bacteria and other germs to grow and cause infection. Sinusitis can develop quickly and last only a short time (acute) or continue over a long period (chronic). Sinusitis that lasts for more than 12 weeks is considered chronic.  CAUSES  Causes of sinusitis include:  Allergies.  Structural abnormalities, such as displacement of the cartilage that separates your nostrils (deviated septum), which can decrease the air flow through your nose and sinuses and affect sinus drainage.  Functional abnormalities, such as when the small hairs (cilia) that line your sinuses and help remove mucus do not work properly or are not present. SYMPTOMS  Symptoms of acute and chronic sinusitis are the same. The primary symptoms are pain and pressure around the affected sinuses. Other symptoms include:  Upper toothache.  Earache.  Headache.  Bad breath.  Decreased sense of smell and taste.  A cough, which worsens when you are lying flat.  Fatigue.  Fever.  Thick drainage from your nose, which often is green and may contain pus (purulent).  Swelling and warmth over the affected sinuses. DIAGNOSIS  Your caregiver will perform a physical exam. During the exam, your caregiver may:  Look in your nose for signs of abnormal growths in your  nostrils (nasal polyps).  Tap over the affected sinus to check for signs of infection.  View the inside of your sinuses (endoscopy) with a special imaging device with a light attached (endoscope), which is inserted into your sinuses. If your caregiver suspects that you have chronic sinusitis, one or more of the following tests may be recommended:  Allergy tests.  Nasal culture A sample of mucus is taken from your nose and sent to a lab and screened for bacteria.  Nasal cytology A sample of mucus is taken from your nose and examined by your caregiver to determine if your sinusitis is related to an allergy. TREATMENT  Most cases of acute sinusitis are related to a viral infection and will resolve on their own within 10 days. Sometimes medicines are prescribed to help relieve symptoms (pain medicine, decongestants, nasal steroid sprays, or saline sprays).  However, for sinusitis related to a bacterial infection, your caregiver will prescribe antibiotic medicines. These are medicines that will help kill the bacteria causing the infection.  Rarely, sinusitis is caused by a fungal infection. In theses cases, your caregiver will prescribe antifungal medicine. For some cases of chronic sinusitis, surgery is needed. Generally, these are cases in which sinusitis recurs more than 3 times per year, despite other treatments. HOME CARE INSTRUCTIONS   Drink plenty of water. Water helps thin the mucus so your sinuses can drain more easily.  Use a humidifier.  Inhale steam 3 to 4 times a day (for example, sit in the bathroom with the shower running).  Apply a warm, moist washcloth to your face 3 to 4 times  a day, or as directed by your caregiver.  Use saline nasal sprays to help moisten and clean your sinuses.  Take over-the-counter or prescription medicines for pain, discomfort, or fever only as directed by your caregiver. SEEK IMMEDIATE MEDICAL CARE IF:  You have increasing pain or severe  headaches.  You have nausea, vomiting, or drowsiness.  You have swelling around your face.  You have vision problems.  You have a stiff neck.  You have difficulty breathing. MAKE SURE YOU:   Understand these instructions.  Will watch your condition.  Will get help right away if you are not doing well or get worse. Document Released: 02/19/2005 Document Revised: 05/14/2011 Document Reviewed: 03/06/2011 [employer] Patient Information 2014 [employer].   

## 2014-08-03 NOTE — Assessment & Plan Note (Signed)
Urine dipstick performed without abnormal findings.  No residual symptoms. No need for further intervention other than glycemin control which was discussed.

## 2014-08-03 NOTE — Progress Notes (Signed)
History of Present Illness: Edwin [name] is a 49 y.o. male who present to the clinic today complaining of sinus pressure,  Left sided sinus pain and nasal congestion x 5 days. Patient endorses sore throat and ear pressure.  Patient denies fever, chills, SOB, chest pain.   Patient also endorses one day of urinary frequency this weekend that has resolved.  Denies dysuria, hematuria or urinary urgency.   History: Past Medical History  Diagnosis Date  . Hypertension   . High cholesterol   . DM (diabetes mellitus)     type 2  . Low testosterone     Current outpatient prescriptions:  .  acetaminophen (TYLENOL) 325 MG tablet, Take 650 mg by mouth every 6 (six) hours as needed (pain)., Disp: , Rfl:  .  amLODipine-atorvastatin (CADUET) 5-10 MG per tablet, Take 1 tablet by mouth daily., Disp: 30 tablet, Rfl: 3 .  Blood Glucose Monitoring Suppl (RELION CONFIRM GLUCOSE MONITOR) W/DEVICE KIT, Use As Directed to Test Blood Glucose Once Daily Dx: 250.00, Disp: 1 kit, Rfl: 0 .  fluticasone (FLONASE) 50 MCG/ACT nasal spray, Place 2 sprays into both nostrils daily as needed for allergies or rhinitis., Disp: 16 g, Rfl: 2 .  glimepiride (AMARYL) 2 MG tablet, Take 1 tablet (2 mg total) by mouth daily with breakfast. As Needed for Blood Sugars >150 (Patient taking differently: Take 1 mg by mouth daily with breakfast. As Needed for Blood Sugars >150), Disp: 30 tablet, Rfl: 3 .  ibuprofen (ADVIL,MOTRIN) 800 MG tablet, Take 1 tablet (800 mg total) by mouth every 8 (eight) hours as needed., Disp: 90 tablet, Rfl: 0 .  metFORMIN (GLUCOPHAGE) 1000 MG tablet, Take 1 tablet 1,000 mg total by mouth twice daily with food., Disp: 60 tablet, Rfl: 5 .  multivitamin (ONE-A-DAY MEN'S) TABS tablet, Take 1 tablet by mouth daily., Disp: , Rfl:  .  NON FORMULARY, EPIQ Testosterone Amplifier, Disp: , Rfl:  .  Pseudoephedrine-Ibuprofen (ADVIL COLD & SINUS LIQUI-GELS PO), Take 1 capsule by mouth daily as needed (flu-like  symptoms)., Disp: , Rfl:  .  RELION LANCETS STANDARD 21G MISC, Use As Directed to Test Blood Glucose Once Daily, Disp: 100 each, Rfl: 0 .  amoxicillin-clavulanate (AUGMENTIN) 875-125 MG per tablet, Take 1 tablet by mouth 2 (two) times daily., Disp: 20 tablet, Rfl: 0 No Known Allergies Family History  Problem Relation Age of Onset  . Aneurysm Mother 35    Deceased  . Healthy Father     Living  . Prostate cancer Paternal Uncle   . Healthy Daughter     x3   History   Social History  . Marital Status: Single    Spouse Name: N/A  . Number of Children: N/A  . Years of Education: N/A   Social History Main Topics  . Smoking status: Former Smoker -- 0.50 packs/day for 15 years    Quit date: 04/17/2005  . Smokeless tobacco: Never Used  . Alcohol Use: No  . Drug Use: No  . Sexual Activity: Not on file   Other Topics Concern  . None   Social History Narrative   Review of Systems: See HPI.  All other ROS are negative.  Physical Examination: BP 141/76 mmHg  Pulse 73  Temp(Src) 98 F (36.7 C) (Oral)  Ht 5' 10" (1.778 m)  Wt 303 lb 3.2 oz (137.531 kg)  BMI 43.50 kg/m2  SpO2 100%  General appearance: alert, cooperative and appears stated age Head: Normocephalic, without obvious abnormality, atraumatic, sinuses tender  to percussion Eyes: conjunctivae/corneas clear. PERRL, EOM's intact. Fundi benign. Ears: normal TM's and external ear canals both ears Nose: moderate congestion, turbinates swollen, sinus tenderness left Throat: lips, mucosa, and tongue normal; teeth and gums normal Neck: no adenopathy, no carotid bruit, no JVD, supple, symmetrical, trachea midline and thyroid not enlarged, symmetric, no tenderness/mass/nodules Lungs: clear to auscultation bilaterally Chest wall: no tenderness  Assessment/Plan: Acute bacterial sinusitis Rx Augmentin.  Increase fluids.  Rest.  Saline nasal spray.  Probiotic.  Mucinex as directed.  Humidifier in bedroom. Resume Flonase.  Call or  return to clinic if symptoms are not improving.    Urine frequency Urine dipstick performed without abnormal findings.  No residual symptoms. No need for further intervention other than glycemin control which was discussed.

## 2014-08-03 NOTE — Progress Notes (Signed)
Pre visit review using our clinic review tool, if applicable. No additional management support is needed unless otherwise documented below in the visit note. 

## 2014-08-19 ENCOUNTER — Telehealth: Payer: Self-pay | Admitting: Physician Assistant

## 2014-08-19 NOTE — Telephone Encounter (Signed)
Caller name:Joanna Relation to ZO:XWRU Call back number: Pharmacy:  Reason for call: Pt states was seen 08-03-14 for sinus infection and was given antibiotic, finish his meds and still not doing very well and have some odor coming from nose, pt wants to know what to do since not better. Pt was schedule an acute for tomorrow 08-20-14 at 3:00 pm. Please advise.

## 2014-08-19 NOTE — Telephone Encounter (Signed)
Pt phone # [phone]

## 2014-08-20 ENCOUNTER — Encounter: Payer: Self-pay | Admitting: Physician Assistant

## 2014-08-20 ENCOUNTER — Ambulatory Visit (INDEPENDENT_AMBULATORY_CARE_PROVIDER_SITE_OTHER): Payer: BLUE CROSS/BLUE SHIELD | Admitting: Physician Assistant

## 2014-08-20 VITALS — BP 130/88 | HR 78 | Temp 98.3°F | Resp 16 | Ht 70.0 in | Wt 303.4 lb

## 2014-08-20 DIAGNOSIS — B9689 Other specified bacterial agents as the cause of diseases classified elsewhere: Secondary | ICD-10-CM

## 2014-08-20 DIAGNOSIS — J019 Acute sinusitis, unspecified: Secondary | ICD-10-CM

## 2014-08-20 MED ORDER — DOXYCYCLINE HYCLATE 100 MG PO CAPS: 100.0000 mg | ORAL_CAPSULE | Freq: Two times a day (BID) | ORAL | Status: DC

## 2014-08-20 NOTE — Progress Notes (Signed)
Patient presents to clinic today c/o continued sinusitis with sinus pressure, sinus pain, fatigue and purulent nasal discharge.  Patient treated with Augmentin.  Endorses taking antibiotic to completion with continued symptoms.  Past Medical History  Diagnosis Date  . Hypertension   . High cholesterol   . DM (diabetes mellitus)     type 2  . Low testosterone     Current Outpatient Prescriptions on File Prior to Visit  Medication Sig Dispense Refill  . acetaminophen (TYLENOL) 325 MG tablet Take 650 mg by mouth every 6 (six) hours as needed (pain).    Marland Kitchen amLODipine-atorvastatin (CADUET) 5-10 MG per tablet Take 1 tablet by mouth daily. 30 tablet 3  . Blood Glucose Monitoring Suppl (RELION CONFIRM GLUCOSE MONITOR) W/DEVICE KIT Use As Directed to Test Blood Glucose Once Daily Dx: 250.00 1 kit 0  . fluticasone (FLONASE) 50 MCG/ACT nasal spray Place 2 sprays into both nostrils daily as needed for allergies or rhinitis. 16 g 2  . glimepiride (AMARYL) 2 MG tablet Take 1 tablet (2 mg total) by mouth daily with breakfast. As Needed for Blood Sugars >150 (Patient taking differently: Take 1 mg by mouth daily with breakfast. As Needed for Blood Sugars >150) 30 tablet 3  . ibuprofen (ADVIL,MOTRIN) 800 MG tablet Take 1 tablet (800 mg total) by mouth every 8 (eight) hours as needed. 90 tablet 0  . metFORMIN (GLUCOPHAGE) 1000 MG tablet Take 1 tablet 1,000 mg total by mouth twice daily with food. 60 tablet 5  . multivitamin (ONE-A-DAY MEN'S) TABS tablet Take 1 tablet by mouth daily.    . NON FORMULARY EPIQ Testosterone Amplifier    . Pseudoephedrine-Ibuprofen (ADVIL COLD & SINUS LIQUI-GELS PO) Take 1 capsule by mouth daily as needed (flu-like symptoms).    Daryll Brod LANCETS STANDARD 21G MISC Use As Directed to Test Blood Glucose Once Daily 100 each 0   No current facility-administered medications on file prior to visit.    Allergies  Allergen Reactions  . Avelox [Moxifloxacin Hcl In Nacl] Hives and  Itching    Family History  Problem Relation Age of Onset  . Aneurysm Mother 5    Deceased  . Healthy Father     Living  . Prostate cancer Paternal Uncle   . Healthy Daughter     x3    History   Social History  . Marital Status: Single    Spouse Name: N/A  . Number of Children: N/A  . Years of Education: N/A   Social History Main Topics  . Smoking status: Former Smoker -- 0.50 packs/day for 15 years    Quit date: 04/17/2005  . Smokeless tobacco: Never Used  . Alcohol Use: No  . Drug Use: No  . Sexual Activity: Not on file   Other Topics Concern  . None   Social History Narrative   Review of Systems - See HPI.  All other ROS are negative.  BP 130/88 mmHg  Pulse 78  Temp(Src) 98.3 F (36.8 C) (Oral)  Resp 16  Ht 5' 10"  (1.778 m)  Wt 303 lb 6 oz (137.61 kg)  BMI 43.53 kg/m2  SpO2 98%  Physical Exam  Constitutional: He is oriented to person, place, and time and well-developed, well-nourished, and in no distress.  HENT:  Head: Normocephalic and atraumatic.  Right Ear: Tympanic membrane, external ear and ear canal normal.  Left Ear: Tympanic membrane, external ear and ear canal normal.  Nose: Left sinus exhibits maxillary sinus tenderness.  Mouth/Throat: Uvula  is midline, oropharynx is clear and moist and mucous membranes are normal.  Cardiovascular: Normal rate, regular rhythm, normal heart sounds and intact distal pulses.   Pulmonary/Chest: Effort normal and breath sounds normal. No respiratory distress. He has no wheezes. He has no rales. He exhibits no tenderness.  Neurological: He is alert and oriented to person, place, and time.  Vitals reviewed.   Recent Results (from the past 2160 hour(s))  Comp Met (CMET)     Status: Abnormal   Collection Time: 06/04/14  1:59 PM  Result Value Ref Range   Sodium 137 135 - 145 mEq/L   Potassium 5.0 3.5 - 5.1 mEq/L   Chloride 103 96 - 112 mEq/L   CO2 32 19 - 32 mEq/L   Glucose, Bld 123 (H) 70 - 99 mg/dL   BUN 12 6  - 23 mg/dL   Creatinine, Ser 0.98 0.40 - 1.50 mg/dL   Total Bilirubin 0.4 0.2 - 1.2 mg/dL   Alkaline Phosphatase 73 39 - 117 U/L   AST 22 0 - 37 U/L   ALT 31 0 - 53 U/L   Total Protein 7.8 6.0 - 8.3 g/dL   Albumin 4.3 3.5 - 5.2 g/dL   Calcium 10.1 8.4 - 10.5 mg/dL   GFR 104.70 >60.00 mL/min  Hemoglobin A1c     Status: Abnormal   Collection Time: 06/04/14  1:59 PM  Result Value Ref Range   Hgb A1c MFr Bld 7.0 (H) 4.6 - 6.5 %    Comment: Glycemic Control Guidelines for People with Diabetes:Non Diabetic:  <6%Goal of Therapy: <7%Additional Action Suggested:  >8%   Lipid panel     Status: Abnormal   Collection Time: 06/04/14  1:59 PM  Result Value Ref Range   Cholesterol 170 0 - 200 mg/dL    Comment: ATP III Classification       Desirable:  < 200 mg/dL               Borderline High:  200 - 239 mg/dL          High:  > = 240 mg/dL   Triglycerides 75.0 0.0 - 149.0 mg/dL    Comment: Normal:  <150 mg/dLBorderline High:  150 - 199 mg/dL   HDL 41.80 >39.00 mg/dL   VLDL 15.0 0.0 - 40.0 mg/dL   LDL Cholesterol 113 (H) 0 - 99 mg/dL   Total CHOL/HDL Ratio 4     Comment:                Men          Women1/2 Average Risk     3.4          3.3Average Risk          5.0          4.42X Average Risk          9.6          7.13X Average Risk          15.0          11.0                       NonHDL 128.20     Comment: NOTE:  Non-HDL goal should be 30 mg/dL higher than patient's LDL goal (i.e. LDL goal of < 70 mg/dL, would have non-HDL goal of < 100 mg/dL)  Urine Microalbumin w/creat. ratio     Status: None   Collection Time: 06/04/14  1:59 PM  Result Value Ref Range   Microalb, Ur <0.7 0.0 - 1.9 mg/dL   Creatinine,U 62.2 mg/dL   Microalb Creat Ratio 1.1 0.0 - 30.0 mg/g  Testosterone     Status: Abnormal   Collection Time: 06/04/14  1:59 PM  Result Value Ref Range   Testosterone 255.65 (L) 300.00 - 890.00 ng/dL  POCT urinalysis dipstick     Status: Normal   Collection Time: 08/03/14 10:26 AM  Result  Value Ref Range   Color, UA straw    Clarity, UA clear    Glucose, UA negative    Bilirubin, UA negative    Ketones, UA negative    Spec Grav, UA 1.015    Blood, UA negative    pH, UA 6.0    Protein, UA     Urobilinogen, UA     Nitrite, UA negative    Leukocytes, UA Negative     Assessment/Plan: Acute bacterial sinusitis Rx Doxycycline. Increase fluids.  Continue saline and allergy medications.  Follow-up if symptoms are not resolving.

## 2014-08-20 NOTE — Patient Instructions (Signed)
Please take antibiotic as directed.  Increase fluid intake.  Use Saline nasal spray.  Take a daily multivitamin. Continue Flonase daily.  Place a humidifier in the bedroom.  Please call or return clinic if symptoms are not improving.  Sinusitis Sinusitis is redness, soreness, and swelling (inflammation) of the paranasal sinuses. Paranasal sinuses are air pockets within the bones of your face (beneath the eyes, the middle of the forehead, or above the eyes). In healthy paranasal sinuses, mucus is able to drain out, and air is able to circulate through them by way of your nose. However, when your paranasal sinuses are inflamed, mucus and air can become trapped. This can allow bacteria and other germs to grow and cause infection. Sinusitis can develop quickly and last only a short time (acute) or continue over a long period (chronic). Sinusitis that lasts for more than 12 weeks is considered chronic.  CAUSES  Causes of sinusitis include:  Allergies.  Structural abnormalities, such as displacement of the cartilage that separates your nostrils (deviated septum), which can decrease the air flow through your nose and sinuses and affect sinus drainage.  Functional abnormalities, such as when the small hairs (cilia) that line your sinuses and help remove mucus do not work properly or are not present. SYMPTOMS  Symptoms of acute and chronic sinusitis are the same. The primary symptoms are pain and pressure around the affected sinuses. Other symptoms include:  Upper toothache.  Earache.  Headache.  Bad breath.  Decreased sense of smell and taste.  A cough, which worsens when you are lying flat.  Fatigue.  Fever.  Thick drainage from your nose, which often is green and may contain pus (purulent).  Swelling and warmth over the affected sinuses. DIAGNOSIS  Your caregiver will perform a physical exam. During the exam, your caregiver may:  Look in your nose for signs of abnormal growths in your  nostrils (nasal polyps).  Tap over the affected sinus to check for signs of infection.  View the inside of your sinuses (endoscopy) with a special imaging device with a light attached (endoscope), which is inserted into your sinuses. If your caregiver suspects that you have chronic sinusitis, one or more of the following tests may be recommended:  Allergy tests.  Nasal culture A sample of mucus is taken from your nose and sent to a lab and screened for bacteria.  Nasal cytology A sample of mucus is taken from your nose and examined by your caregiver to determine if your sinusitis is related to an allergy. TREATMENT  Most cases of acute sinusitis are related to a viral infection and will resolve on their own within 10 days. Sometimes medicines are prescribed to help relieve symptoms (pain medicine, decongestants, nasal steroid sprays, or saline sprays).  However, for sinusitis related to a bacterial infection, your caregiver will prescribe antibiotic medicines. These are medicines that will help kill the bacteria causing the infection.  Rarely, sinusitis is caused by a fungal infection. In theses cases, your caregiver will prescribe antifungal medicine. For some cases of chronic sinusitis, surgery is needed. Generally, these are cases in which sinusitis recurs more than 3 times per year, despite other treatments. HOME CARE INSTRUCTIONS   Drink plenty of water. Water helps thin the mucus so your sinuses can drain more easily.  Use a humidifier.  Inhale steam 3 to 4 times a day (for example, sit in the bathroom with the shower running).  Apply a warm, moist washcloth to your face 3 to 4  times a day, or as directed by your caregiver.  Use saline nasal sprays to help moisten and clean your sinuses.  Take over-the-counter or prescription medicines for pain, discomfort, or fever only as directed by your caregiver. SEEK IMMEDIATE MEDICAL CARE IF:  You have increasing pain or severe  headaches.  You have nausea, vomiting, or drowsiness.  You have swelling around your face.  You have vision problems.  You have a stiff neck.  You have difficulty breathing. MAKE SURE YOU:   Understand these instructions.  Will watch your condition.  Will get help right away if you are not doing well or get worse. Document Released: 02/19/2005 Document Revised: 05/14/2011 Document Reviewed: 03/06/2011 Exit[employer] Patient Information 2014 [employer].   

## 2014-08-20 NOTE — Assessment & Plan Note (Signed)
Rx Doxycycline. Increase fluids.  Continue saline and allergy medications.  Follow-up if symptoms are not resolving.

## 2014-08-20 NOTE — Telephone Encounter (Signed)
At this point I would recommend he keep appointment today for reassessment so we can determine what further interventions (antibiotics, etc.) are needed.

## 2014-08-20 NOTE — Progress Notes (Signed)
Pre visit review using our clinic review tool, if applicable. No additional management support is needed unless otherwise documented below in the visit note/SLS  

## 2014-08-27 ENCOUNTER — Telehealth: Payer: Self-pay | Admitting: Physician Assistant

## 2014-08-27 NOTE — Telephone Encounter (Signed)
Caller name: Jahlil Relation to pt: Call back number: [phone] Pharmacy:   Reason for call:   Requesting refill of caduet. He states that we usually order this from [employer] for patient.

## 2014-09-01 ENCOUNTER — Encounter: Payer: Self-pay | Admitting: Physician Assistant

## 2014-09-01 ENCOUNTER — Ambulatory Visit (INDEPENDENT_AMBULATORY_CARE_PROVIDER_SITE_OTHER): Payer: BLUE CROSS/BLUE SHIELD | Admitting: Physician Assistant

## 2014-09-01 ENCOUNTER — Telehealth: Payer: Self-pay | Admitting: *Deleted

## 2014-09-01 VITALS — BP 122/77 | HR 70

## 2014-09-01 DIAGNOSIS — I493 Ventricular premature depolarization: Secondary | ICD-10-CM

## 2014-09-01 NOTE — Telephone Encounter (Signed)
Patient states that he is monitoring his BP at home:  Today they have been 117/81 HR 84, 126/70 HR 71.  He stated that the BP monitor said "irregular heartbeat."  He did not feel any flutters or sensation, no lightheadedness, no chest pain or shortness of breath.  He would like to know if he should be seen to check this.  Please advise.

## 2014-09-01 NOTE — Progress Notes (Signed)
Pre visit review using our clinic review tool, if applicable. No additional management support is needed unless otherwise documented below in the visit note.  Patient presented to office for BP check per phone note from earlier today.    Patient states that he felt heart flutters on his drive to the office.  Elyn Aquas, [place] notified and he gave verbal order for EKG.  EKG obtained and Elyn Aquas, PA ordered holter monitor for patient and notified patient.  Patient notified to limit caffeine intake and to go to ED if he started feeling chest pain, shortness of breath, or dizziness.  He stated understanding and agreed.

## 2014-09-01 NOTE — Telephone Encounter (Signed)
Notified patient and he stated understanding.

## 2014-09-01 NOTE — Telephone Encounter (Signed)
Patient states that he is still having high blood pressure and wanted to schedule appointment. I informed him that [name] would not be in the office tomorrow but that I could get him in with another provider. He states that he only wants to see Edwin [name]. I did inform him that [name] does have availability on Friday. Patient states that he may just go on to the ED.

## 2014-09-01 NOTE — Telephone Encounter (Signed)
I would have him check again tonight and tomorrow to record BP and pulse.  Without symptoms I doubt it is anything worrisome, but if he notes symptoms or BP monitor still give him that warning, have him come in sometime Thursday or Friday for assessment.

## 2014-09-01 NOTE — Telephone Encounter (Signed)
Notified Elyn Aquas, [place] who stated that patient can come in for nurse BP/pulse check if asymptomatic.  Patient notified, denies chest pain, palpitations, or shortness of breath.  Scheduled at 4pm today.

## 2014-09-03 DIAGNOSIS — I493 Ventricular premature depolarization: Secondary | ICD-10-CM | POA: Insufficient documentation

## 2014-09-03 LAB — HM DIABETES EYE EXAM

## 2014-09-03 NOTE — Progress Notes (Signed)
Patient presents to clinic today for nurse visit due to BP machine at home noting abnormal pulse. Patient endorses good BP measurements at home. Patient was told to come in for nurse visit for assessment and BP/pulse check. BP normotensive. Patient denies chest pain, palpitations, lightheadedness, dizziness, vision changes or frequent headaches.  EKG obtained by RN per my instruction.  Past Medical History  Diagnosis Date  . Hypertension   . High cholesterol   . DM (diabetes mellitus)     type 2  . Low testosterone     Current Outpatient Prescriptions on File Prior to Visit  Medication Sig Dispense Refill  . acetaminophen (TYLENOL) 325 MG tablet Take 650 mg by mouth every 6 (six) hours as needed (pain).    Marland Kitchen amLODipine-atorvastatin (CADUET) 5-10 MG per tablet Take 1 tablet by mouth daily. 30 tablet 3  . Blood Glucose Monitoring Suppl (RELION CONFIRM GLUCOSE MONITOR) W/DEVICE KIT Use As Directed to Test Blood Glucose Once Daily Dx: 250.00 1 kit 0  . doxycycline (VIBRAMYCIN) 100 MG capsule Take 1 capsule (100 mg total) by mouth 2 (two) times daily. 14 capsule 0  . fluticasone (FLONASE) 50 MCG/ACT nasal spray Place 2 sprays into both nostrils daily as needed for allergies or rhinitis. 16 g 2  . glimepiride (AMARYL) 2 MG tablet Take 1 tablet (2 mg total) by mouth daily with breakfast. As Needed for Blood Sugars >150 (Patient taking differently: Take 1 mg by mouth daily with breakfast. As Needed for Blood Sugars >150) 30 tablet 3  . ibuprofen (ADVIL,MOTRIN) 800 MG tablet Take 1 tablet (800 mg total) by mouth every 8 (eight) hours as needed. 90 tablet 0  . metFORMIN (GLUCOPHAGE) 1000 MG tablet Take 1 tablet 1,000 mg total by mouth twice daily with food. 60 tablet 5  . multivitamin (ONE-A-DAY MEN'S) TABS tablet Take 1 tablet by mouth daily.    . NON FORMULARY EPIQ Testosterone Amplifier    . Pseudoephedrine-Ibuprofen (ADVIL COLD & SINUS LIQUI-GELS PO) Take 1 capsule by mouth daily as needed  (flu-like symptoms).    Daryll Brod LANCETS STANDARD 21G MISC Use As Directed to Test Blood Glucose Once Daily 100 each 0   No current facility-administered medications on file prior to visit.    Allergies  Allergen Reactions  . Avelox [Moxifloxacin Hcl In Nacl] Hives and Itching    Family History  Problem Relation Age of Onset  . Aneurysm Mother 51    Deceased  . Healthy Father     Living  . Prostate cancer Paternal Uncle   . Healthy Daughter     x3    History   Social History  . Marital Status: Single    Spouse Name: N/A  . Number of Children: N/A  . Years of Education: N/A   Social History Main Topics  . Smoking status: Former Smoker -- 0.50 packs/day for 15 years    Quit date: 04/17/2005  . Smokeless tobacco: Never Used  . Alcohol Use: No  . Drug Use: No  . Sexual Activity: Not on file   Other Topics Concern  . Not on file   Social History Narrative   Review of Systems - See HPI.  All other ROS are negative.  BP 122/77 mmHg  Pulse 70  Physical Exam  Constitutional: He is oriented to person, place, and time and well-developed, well-nourished, and in no distress.  HENT:  Head: Normocephalic and atraumatic.  Eyes: Conjunctivae are normal.  Cardiovascular: Normal rate, regular rhythm,  normal heart sounds and intact distal pulses.   Pulmonary/Chest: Effort normal and breath sounds normal. No respiratory distress. He has no wheezes. He has no rales. He exhibits no tenderness.  Neurological: He is alert and oriented to person, place, and time.  Skin: Skin is warm and dry. No rash noted.  Psychiatric: Affect normal.  Vitals reviewed.  Recent Results (from the past 2160 hour(s))  POCT urinalysis dipstick     Status: Normal   Collection Time: 08/03/14 10:26 AM  Result Value Ref Range   Color, UA straw    Clarity, UA clear    Glucose, UA negative    Bilirubin, UA negative    Ketones, UA negative    Spec Grav, UA 1.015    Blood, UA negative    pH, UA 6.0      Protein, UA     Urobilinogen, UA     Nitrite, UA negative    Leukocytes, UA Negative     Assessment/Plan: PVC's (premature ventricular contractions) BP normotensive. Asymptomatic. EKG with NSR and occasional PVC. Will order Holter to further assess. Caffeine to be avoided. Continue medications as directed. Alarm signs/symptoms discussed with patient. Follow-up will be based on results.

## 2014-09-03 NOTE — Assessment & Plan Note (Signed)
BP normotensive. Asymptomatic. EKG with NSR and occasional PVC. Will order Holter to further assess. Caffeine to be avoided. Continue medications as directed. Alarm signs/symptoms discussed with patient. Follow-up will be based on results.

## 2014-09-03 NOTE — Telephone Encounter (Signed)
Called [employer] @ [phone]) and placed order for Caduet 5-10mg  #30,3.  Order#:[id number]-Arrival time:7-10 business days.  LM[facility] @ 10:30am @ [phone]) informing the pt that his order for the Caduet has been placed and it will be 7-10 business days.//AB/CMA

## 2014-09-07 ENCOUNTER — Encounter: Payer: Self-pay | Admitting: Physician Assistant

## 2014-09-09 ENCOUNTER — Ambulatory Visit (INDEPENDENT_AMBULATORY_CARE_PROVIDER_SITE_OTHER): Payer: BLUE CROSS/BLUE SHIELD

## 2014-09-09 DIAGNOSIS — I493 Ventricular premature depolarization: Secondary | ICD-10-CM

## 2014-09-10 ENCOUNTER — Telehealth: Payer: Self-pay | Admitting: *Deleted

## 2014-09-10 NOTE — Telephone Encounter (Signed)
Called and [facility] @ 1:25pm @ [phone]) informing the pt that the samples for Caduet have come in and he can pick them up.  They will be left up front.//AB/CMA

## 2014-09-16 ENCOUNTER — Telehealth: Payer: Self-pay

## 2014-09-16 ENCOUNTER — Other Ambulatory Visit: Payer: Self-pay | Admitting: Family Medicine

## 2014-09-16 DIAGNOSIS — I493 Ventricular premature depolarization: Secondary | ICD-10-CM

## 2014-09-16 NOTE — Telephone Encounter (Signed)
Pt requesting call before 3pm if possible.

## 2014-09-16 NOTE — Telephone Encounter (Signed)
Referral placed.

## 2014-09-16 NOTE — Telephone Encounter (Signed)
Pt informed of the holter monitor results would like to have cardiology referral placed. No questions at this time.

## 2014-11-08 NOTE — Progress Notes (Signed)
Patient ID: Edwin [name], male   DOB: 1965/04/09, 49 y.o.   MRN: [id number]     Cardiology Office Note   Date:  11/08/2014   ID:  Clell Trahan, DOB 1965-08-21, MRN [id number]  PCP:  Leeanne Rio, PA-C  Cardiologist:   Jenkins Rouge, MD   No chief complaint on file.     History of Present Illness: Edwin [name] is a 49 y.o. male who presents for evaluation of palpitations and PVC;s.  History of HTN, type 2 DM and elevated lipids  09/09/14 Holter 48 hr Read by Dr Mare Ferrari as not concerning SR with occasional PVC and PAC.  No VT or afib.  Lab review 06/10/14 shows A1c is high at 7.0  07/24/13 TSH normal Had false positive myovue in [place] 3 years ago with normal cath.  No chest pain , dyspnea or palpitations He was having PVC;s in office and does not  Notice them.  Diet still needs improvement  Overweight.  Compliant with meds  Drives a city bus From [employer]    Past Medical History  Diagnosis Date  . Hypertension   . High cholesterol   . DM (diabetes mellitus)     type 2  . Low testosterone     Past Surgical History  Procedure Laterality Date  . Nasal sinus surgery  2010  . Wisdom tooth extraction       Current Outpatient Prescriptions  Medication Sig Dispense Refill  . acetaminophen (TYLENOL) 325 MG tablet Take 650 mg by mouth every 6 (six) hours as needed (pain).    Marland Kitchen amLODipine-atorvastatin (CADUET) 5-10 MG per tablet Take 1 tablet by mouth daily. 30 tablet 3  . Blood Glucose Monitoring Suppl (RELION CONFIRM GLUCOSE MONITOR) W/DEVICE KIT Use As Directed to Test Blood Glucose Once Daily Dx: 250.00 1 kit 0  . doxycycline (VIBRAMYCIN) 100 MG capsule Take 1 capsule (100 mg total) by mouth 2 (two) times daily. 14 capsule 0  . fluticasone (FLONASE) 50 MCG/ACT nasal spray Place 2 sprays into both nostrils daily as needed for allergies or rhinitis. 16 g 2  . glimepiride (AMARYL) 2 MG tablet Take 1 tablet (2 mg total) by mouth daily with breakfast. As Needed for  Blood Sugars >150 (Patient taking differently: Take 1 mg by mouth daily with breakfast. As Needed for Blood Sugars >150) 30 tablet 3  . ibuprofen (ADVIL,MOTRIN) 800 MG tablet Take 1 tablet (800 mg total) by mouth every 8 (eight) hours as needed. 90 tablet 0  . metFORMIN (GLUCOPHAGE) 1000 MG tablet Take 1 tablet 1,000 mg total by mouth twice daily with food. 60 tablet 5  . multivitamin (ONE-A-DAY MEN'S) TABS tablet Take 1 tablet by mouth daily.    . NON FORMULARY EPIQ Testosterone Amplifier    . Pseudoephedrine-Ibuprofen (ADVIL COLD & SINUS LIQUI-GELS PO) Take 1 capsule by mouth daily as needed (flu-like symptoms).    Daryll Brod LANCETS STANDARD 21G MISC Use As Directed to Test Blood Glucose Once Daily 100 each 0   No current facility-administered medications for this visit.    Allergies:   Avelox    Social History:  The patient  reports that he quit smoking about 9 years ago. He has never used smokeless tobacco. He reports that he does not drink alcohol or use illicit drugs.   Family History:  The patient's family history includes Aneurysm (age of onset: 66) in his mother; Healthy in his daughter and father; Prostate cancer in his paternal uncle.    ROS:  Please see the history of present illness.   Otherwise, review of systems are positive for none.   All other systems are reviewed and negative.    PHYSICAL EXAM: VS:  There were no vitals taken for this visit. , BMI There is no weight on file to calculate BMI. Affect appropriate Overweight black male  HEENT: normal Neck supple with no adenopathy JVP normal no bruits no thyromegaly Lungs clear with no wheezing and good diaphragmatic motion Heart:  S1/S2 no murmur, no rub, gallop or click PMI normal Abdomen: benighn, BS positve, no tenderness, no AAA no bruit.  No HSM or HJR Distal pulses intact with no bruits No edema Neuro non-focal Skin warm and dry No muscular weakness    EKG:   09/01/14  SR rate 75 PVC;s normal ST segments  and QT    Recent Labs: 06/04/2014: ALT 31; BUN 12; Creatinine, Ser 0.98; Potassium 5.0; Sodium 137    Lipid Panel    Component Value Date/Time   CHOL 170 06/04/2014 1359   TRIG 75.0 06/04/2014 1359   HDL 41.80 06/04/2014 1359   CHOLHDL 4 06/04/2014 1359   VLDL 15.0 06/04/2014 1359   LDLCALC 113* 06/04/2014 1359      Wt Readings from Last 3 Encounters:  08/20/14 137.61 kg (303 lb 6 oz)  08/03/14 137.531 kg (303 lb 3.2 oz)  06/04/14 136.533 kg (301 lb)      Other studies Reviewed: Additional studies/ records that were reviewed today include: Epic notes and pimary care records.    ASSESSMENT AND PLAN:  1.  PVC;s  Asymptomatic no history of CAD with normal cath 3 years ago.  F/U echo to make sure EF is normal and observe 2. HTN:  Continue caduet  Low sodium diet 3. DM:  Discussed low carb diet.  Target hemoglobin A1c is 6.5 or less.  Continue current medications.    Current medicines are reviewed at length with the patient today.  The patient does not have concerns regarding medicines.  The following changes have been made:  no change  Labs/ tests ordered today include: Echo  No orders of the defined types were placed in this encounter.     Disposition:   FU with me in 6 months      Signed, Jenkins Rouge, MD  11/08/2014 3:36 PM    [facility] [place], [place],   [address] Phone: [phone]; Fax: [phone]

## 2014-11-10 ENCOUNTER — Encounter: Payer: Self-pay | Admitting: Cardiovascular Disease

## 2014-11-10 ENCOUNTER — Ambulatory Visit (INDEPENDENT_AMBULATORY_CARE_PROVIDER_SITE_OTHER): Payer: BLUE CROSS/BLUE SHIELD | Admitting: Cardiovascular Disease

## 2014-11-10 VITALS — BP 122/70 | HR 81 | Ht 71.0 in | Wt 305.1 lb

## 2014-11-10 DIAGNOSIS — I10 Essential (primary) hypertension: Secondary | ICD-10-CM

## 2014-11-10 DIAGNOSIS — I493 Ventricular premature depolarization: Secondary | ICD-10-CM | POA: Diagnosis not present

## 2014-11-10 NOTE — Patient Instructions (Addendum)
Medication Instructions:  NO CHANGES  Labwork: NONE  Testing/Procedures: Your physician has requested that you have an echocardiogram. Echocardiography is a painless test that uses sound waves to create images of your heart. It provides your doctor with information about the size and shape of your heart and how well your heart's chambers and valves are working. This procedure takes approximately one hour. There are no restrictions for this procedure.    Follow-Up: Your physician wants you to follow-up in: 6 MONTHS  WITH  DR NISHAN  You will receive a reminder letter in the mail two months in advance. If you don't receive a letter, please call our office to schedule the follow-up appointment.   Any Other Special Instructions Will Be Listed Below (If Applicable).   

## 2014-11-19 ENCOUNTER — Other Ambulatory Visit: Payer: Self-pay

## 2014-11-19 ENCOUNTER — Ambulatory Visit (HOSPITAL_COMMUNITY): Payer: BLUE CROSS/BLUE SHIELD | Attending: Cardiovascular Disease

## 2014-11-19 DIAGNOSIS — I517 Cardiomegaly: Secondary | ICD-10-CM | POA: Insufficient documentation

## 2014-11-19 DIAGNOSIS — E785 Hyperlipidemia, unspecified: Secondary | ICD-10-CM | POA: Diagnosis not present

## 2014-11-19 DIAGNOSIS — E119 Type 2 diabetes mellitus without complications: Secondary | ICD-10-CM | POA: Diagnosis not present

## 2014-11-19 DIAGNOSIS — Z6841 Body Mass Index (BMI) 40.0 and over, adult: Secondary | ICD-10-CM | POA: Insufficient documentation

## 2014-11-19 DIAGNOSIS — E669 Obesity, unspecified: Secondary | ICD-10-CM | POA: Insufficient documentation

## 2014-11-19 DIAGNOSIS — I10 Essential (primary) hypertension: Secondary | ICD-10-CM | POA: Insufficient documentation

## 2014-11-19 DIAGNOSIS — I493 Ventricular premature depolarization: Secondary | ICD-10-CM | POA: Insufficient documentation

## 2014-11-26 ENCOUNTER — Telehealth: Payer: Self-pay | Admitting: Physician Assistant

## 2014-11-26 MED ORDER — AMLODIPINE-ATORVASTATIN 5-10 MG PO TABS: 1.0000 | ORAL_TABLET | Freq: Every day | ORAL | Status: DC

## 2014-11-26 NOTE — Telephone Encounter (Signed)
Pt called stating he needs to order amLODipine-atorvastatin (CADUET) 5-10 MG per tablet. He has about 15 days. He said we have to order it thru [facility] for him. Please call him with concerns at [phone].

## 2014-11-26 NOTE — Telephone Encounter (Signed)
[employer] @ [phone]) and placed order for Caduet 5-10mg  #30,3. Order#:[id number]-Arrival time:7-10 business days.  amLODipine-atorvastatin (CADUET) 5-10 MG per tablet [[id number]]      Order Details    Dose: 1 tablet Route: Oral   Order Comments:   [employer] Patient Assistance ID [id number], Order confirmation [id number]       Dispense Quantity: 30 tablet Refills: 3 Fills Remaining: 3               Written Date: 11/26/14 Expiration Date: 11/26/14    Start Date: 11/26/14 End Date: --    Ordering Provider: Brunetta Jeans, PA-C Authorizing Provider: Brunetta Jeans, PA-C Ordering User: Rockwell Germany, CMA                 Original Order: amLODipine-atorvastatin (CADUET) 5-10 MG per tablet [[id number]]            [facility] with contact name and number RE: Rx ordered via Patient Assistance & I will call him when order has arrived at office/SLS

## 2014-12-02 ENCOUNTER — Telehealth: Payer: Self-pay | Admitting: Physician Assistant

## 2014-12-02 NOTE — Telephone Encounter (Signed)
LVM advising patient of message below °

## 2014-12-02 NOTE — Telephone Encounter (Signed)
Needs follow-up for Diabetes in October.

## 2014-12-02 NOTE — Telephone Encounter (Signed)
Patient was last seen 09/01/2014 and would like to know when he should follow up. Please advise

## 2014-12-06 ENCOUNTER — Telehealth: Payer: Self-pay | Admitting: *Deleted

## 2014-12-06 NOTE — Telephone Encounter (Signed)
Received Caduet via patient assistance, [place at front desk for pt p/u, call & informed patient/SLS

## 2014-12-12 ENCOUNTER — Other Ambulatory Visit: Payer: Self-pay | Admitting: Physician Assistant

## 2014-12-13 ENCOUNTER — Telehealth: Payer: Self-pay | Admitting: Physician Assistant

## 2014-12-13 NOTE — Telephone Encounter (Signed)
Lease note that Metformin had already been sent to pharmacy at 9:15am this morning, and as far as the Ibuprofen, [name] is not in the office toady & I am working with Dr. Birdie Riddle; so you will need to find out from [place] who is covering Cody's basket/SLS Thanks.

## 2014-12-13 NOTE — Telephone Encounter (Signed)
Caller name: Donielle Lorton   Relationship to patient: Self   Can be reached: [phone]   Pharmacy: -[place], [place].  Reason for call: pt says that he is out of his metFORMIN and his ibuprofen Rx. I moved up pt's appt to 10/14 at 1:15 instead of his original appt on 10/28. He would like to know if he can have a few until his appt this Friday because pt is completely out.

## 2014-12-14 MED ORDER — IBUPROFEN 800 MG PO TABS: 800.0000 mg | ORAL_TABLET | Freq: Three times a day (TID) | ORAL | Status: DC | PRN

## 2014-12-14 NOTE — Telephone Encounter (Signed)
Called pt to inform Metformin is at pharmacy for pick up. Pt says that he still need refill on ibuprofen.   Once refilled I will follow up with pt to inform.      Thanks.

## 2014-12-14 NOTE — Addendum Note (Signed)
Addended by: Raiford Noble on: 12/14/2014 04:02 PM   Modules accepted: Orders

## 2014-12-14 NOTE — Telephone Encounter (Signed)
Has been sent as well by myself personally as this is the first message I have personally received.

## 2014-12-17 ENCOUNTER — Ambulatory Visit: Payer: BLUE CROSS/BLUE SHIELD | Admitting: Physician Assistant

## 2014-12-21 ENCOUNTER — Ambulatory Visit (INDEPENDENT_AMBULATORY_CARE_PROVIDER_SITE_OTHER): Payer: BLUE CROSS/BLUE SHIELD | Admitting: Physician Assistant

## 2014-12-21 ENCOUNTER — Encounter: Payer: Self-pay | Admitting: Physician Assistant

## 2014-12-21 VITALS — BP 124/90 | HR 89 | Temp 98.0°F | Resp 16 | Ht 71.0 in | Wt 296.1 lb

## 2014-12-21 DIAGNOSIS — E119 Type 2 diabetes mellitus without complications: Secondary | ICD-10-CM

## 2014-12-21 LAB — COMPREHENSIVE METABOLIC PANEL
ALT: 22 U/L (ref 0–53)
AST: 19 U/L (ref 0–37)
Albumin: 4.4 g/dL (ref 3.5–5.2)
Alkaline Phosphatase: 71 U/L (ref 39–117)
BUN: 12 mg/dL (ref 6–23)
CHLORIDE: 101 meq/L (ref 96–112)
CO2: 32 mEq/L (ref 19–32)
Calcium: 9.9 mg/dL (ref 8.4–10.5)
Creatinine, Ser: 1.07 mg/dL (ref 0.40–1.50)
GFR: 94.39 mL/min (ref >60.00)
GLUCOSE: 139 mg/dL — AB (ref 70–99)
POTASSIUM: 4.7 meq/L (ref 3.5–5.1)
SODIUM: 137 meq/L (ref 135–145)
Total Bilirubin: 0.7 mg/dL (ref 0.2–1.2)
Total Protein: 8 g/dL (ref 6.0–8.3)

## 2014-12-21 LAB — HEMOGLOBIN A1C: Hgb A1c MFr Bld: 6.3 % (ref 4.6–6.5)

## 2014-12-21 NOTE — Assessment & Plan Note (Signed)
Doing well. Will repeat CMP and A1C today. Declines flu shot. Wants to check with insurance before getting Pneumovax. Follow-up will be either 3 or 6 months based on results.

## 2014-12-21 NOTE — Patient Instructions (Signed)
Please go to the lab for blood work. I will call you with your results. Keep up the good work with your diet and exercise.  Follow-up will be based on lab results.  Please call your insurance rep regarding coverage for the Pneumovax  Diabetes and Exercise Exercising regularly is important. It is not just about losing weight. It has many health benefits, such as:  Improving your overall fitness, flexibility, and endurance.  Increasing your bone density.  Helping with weight control.  Decreasing your body fat.  Increasing your muscle strength.  Reducing stress and tension.  Improving your overall health. People with diabetes who exercise gain additional benefits because exercise:  Reduces appetite.  Improves the body's use of blood sugar (glucose).  Helps lower or control blood glucose.  Decreases blood pressure.  Helps control blood lipids (such as cholesterol and triglycerides).  Improves the body's use of the hormone insulin by:  Increasing the body's insulin sensitivity.  Reducing the body's insulin needs.  Decreases the risk for heart disease because exercising:  Lowers cholesterol and triglycerides levels.  Increases the levels of good cholesterol (such as high-density lipoproteins [HDL]) in the body.  Lowers blood glucose levels. YOUR ACTIVITY PLAN  Choose an activity that you enjoy, and set realistic goals. To exercise safely, you should begin practicing any new physical activity slowly, and gradually increase the intensity of the exercise over time. Your health care provider or diabetes educator can help create an activity plan that works for you. General recommendations include:  Encouraging children to engage in at least 60 minutes of physical activity each day.  Stretching and performing strength training exercises, such as yoga or weight lifting, at least 2 times per week.  Performing a total of at least 150 minutes of moderate-intensity exercise each  week, such as brisk walking or water aerobics.  Exercising at least 3 days per week, making sure you allow no more than 2 consecutive days to pass without exercising.  Avoiding long periods of inactivity (90 minutes or more). When you have to spend an extended period of time sitting down, take frequent breaks to walk or stretch. RECOMMENDATIONS FOR EXERCISING WITH TYPE 1 OR TYPE 2 DIABETES   Check your blood glucose before exercising. If blood glucose levels are greater than 240 mg/dL, check for urine ketones. Do not exercise if ketones are present.  Avoid injecting insulin into areas of the body that are going to be exercised. For example, avoid injecting insulin into:  The arms when playing tennis.  The legs when jogging.  Keep a record of:  Food intake before and after you exercise.  Expected peak times of insulin action.  Blood glucose levels before and after you exercise.  The type and amount of exercise you have done.  Review your records with your health care provider. Your health care provider will help you to develop guidelines for adjusting food intake and insulin amounts before and after exercising.  If you take insulin or oral hypoglycemic agents, watch for signs and symptoms of hypoglycemia. They include:  Dizziness.  Shaking.  Sweating.  Chills.  Confusion.  Drink plenty of water while you exercise to prevent dehydration or heat stroke. Body water is lost during exercise and must be replaced.  Talk to your health care provider before starting an exercise program to make sure it is safe for you. Remember, almost any type of activity is better than none.   This information is not intended to replace advice given to  you by your health care provider. Make sure you discuss any questions you have with your health care provider.   Document Released: 05/12/2003 Document Revised: 07/06/2014 Document Reviewed: 07/29/2012 Elsevier Interactive Patient Education [employer]

## 2014-12-21 NOTE — Progress Notes (Signed)
Pre visit review using our clinic review tool, if applicable. No additional management support is needed unless otherwise documented below in the visit note/SLS  

## 2014-12-21 NOTE — Progress Notes (Signed)
Patient presents to clinic today for follow-up of DM II, previously controlled without complications. Is taking medications (Metformin). Endorses fasting sugars averaging 90-120. Is checking daily. Endorses Ophthalmology follow-up in the past 4 months. Is up-to-date on flu shot.  Past Medical History  Diagnosis Date  . Hypertension   . High cholesterol   . DM (diabetes mellitus) ([place])     type 2  . Low testosterone     Current Outpatient Prescriptions on File Prior to Visit  Medication Sig Dispense Refill  . acetaminophen (TYLENOL) 325 MG tablet Take 650 mg by mouth every 6 (six) hours as needed (pain).    Marland Kitchen amLODipine-atorvastatin (CADUET) 5-10 MG per tablet Take 1 tablet by mouth daily. 30 tablet 3  . fluticasone (FLONASE) 50 MCG/ACT nasal spray Place 2 sprays into both nostrils daily as needed for allergies or rhinitis. 16 g 2  . glimepiride (AMARYL) 2 MG tablet Take 2 mg by mouth daily as needed (for blood Su[place] > 150).    Marland Kitchen ibuprofen (ADVIL,MOTRIN) 800 MG tablet Take 1 tablet (800 mg total) by mouth every 8 (eight) hours as needed for mild pain or moderate pain. 30 tablet 2  . metFORMIN (GLUCOPHAGE) 1000 MG tablet TAKE ONE TABLET BY MOUTH TWICE DAILY WITH FOOD 60 tablet 0  . multivitamin (ONE-A-DAY MEN'S) TABS tablet Take 1 tablet by mouth daily.     No current facility-administered medications on file prior to visit.    Allergies  Allergen Reactions  . Avelox [Moxifloxacin Hcl In Nacl] Hives and Itching    Family History  Problem Relation Age of Onset  . Aneurysm Mother 76    Deceased  . Healthy Father     Living  . Prostate cancer Paternal Uncle   . Healthy Daughter     x3    Social History   Social History  . Marital Status: Single    Spouse Name: N/A  . Number of Children: N/A  . Years of Education: N/A   Social History Main Topics  . Smoking status: Former Smoker -- 0.50 packs/day for 15 years    Quit date: 04/17/2005  . Smokeless tobacco: Never Used   . Alcohol Use: No  . Drug Use: No  . Sexual Activity: Not Asked   Other Topics Concern  . None   Social History Narrative   Review of Systems - See HPI.  All other ROS are negative.  BP 124/90 mmHg  Pulse 89  Temp(Src) 98 F (36.7 C) (Oral)  Resp 16  Ht 5\' 11"  (1.803 m)  Wt 296 lb 2 oz (134.321 kg)  BMI 41.32 kg/m2  SpO2 98%  Physical Exam  Constitutional: He is oriented to person, place, and time and well-developed, well-nourished, and in no distress.  HENT:  Head: Normocephalic and atraumatic.  Eyes: Conjunctivae are normal.  Neck: Neck supple.  Cardiovascular: Normal rate, regular rhythm, normal heart sounds and intact distal pulses.   Pulmonary/Chest: Effort normal and breath sounds normal. No respiratory distress. He has no wheezes. He has no rales. He exhibits no tenderness.  Neurological: He is alert and oriented to person, place, and time.  Skin: Skin is warm and dry. No rash noted.  Psychiatric: Affect normal.  Vitals reviewed.  No results found for this or any previous visit (from the past 2160 hour(s)).  Assessment/Plan: Diabetes mellitus type II, controlled Doing well. Will repeat CMP and A1C today. Declines flu shot. Wants to check with insurance before getting Pneumovax. Follow-up will  be either 3 or 6 months based on results.

## 2014-12-31 ENCOUNTER — Ambulatory Visit: Payer: BLUE CROSS/BLUE SHIELD | Admitting: Physician Assistant

## 2015-01-18 ENCOUNTER — Other Ambulatory Visit: Payer: Self-pay | Admitting: Physician Assistant

## 2015-01-18 ENCOUNTER — Telehealth: Payer: Self-pay

## 2015-01-18 NOTE — Telephone Encounter (Signed)
Patient needs refill metformin pharmacy told patient he needed to contact office for refill he stated he has already did his A1c and does not believe he needs another appointment for this refill please advise

## 2015-01-18 NOTE — Telephone Encounter (Signed)
Refills have been sent in. 

## 2015-02-04 ENCOUNTER — Other Ambulatory Visit: Payer: Self-pay | Admitting: Physician Assistant

## 2015-02-04 MED ORDER — IBUPROFEN 800 MG PO TABS: 800.0000 mg | ORAL_TABLET | Freq: Three times a day (TID) | ORAL | Status: DC | PRN

## 2015-02-04 NOTE — Telephone Encounter (Signed)
Done

## 2015-02-04 NOTE — Telephone Encounter (Signed)
Caller name: Self   Can be reached: [phone]  Pharmacy:  [facility] [place], [place]. [phone] (Phone) [phone] (Fax)         Reason for call: Request a 90 day supply of ibuprofen (ADVIL,MOTRIN) 800 MG tablet [id number] instead of 30 days because it is less expensive

## 2015-02-15 LAB — HM DIABETES EYE EXAM

## 2015-03-09 ENCOUNTER — Telehealth: Payer: Self-pay | Admitting: Physician Assistant

## 2015-03-09 NOTE — Telephone Encounter (Signed)
Caller name: [place]   Relationship to patient: Self   Can be reached: [phone]  Pharmacy: [facility] [place], [place].  Reason for call: Pt is requesting a refill on his metformin and also CADUET Rx.

## 2015-03-09 NOTE — Telephone Encounter (Signed)
Metformin Rx request to pharmacy; [facility] @ [phone]) and placed order for Caduet 5-10mg  #30,3. Order#:[id number]-Arrival time:7-10 business days. Patient's enrollment in [place] program began on 02/07/2014 and enrollment in program ended/terminated on 03/05/15; Patient informed, understood & agreed to call and get reinstated in program and he will than call back and LM for me to call & place order Caduet/SLS

## 2015-03-21 ENCOUNTER — Telehealth: Payer: Self-pay | Admitting: *Deleted

## 2015-03-21 ENCOUNTER — Other Ambulatory Visit: Payer: Self-pay | Admitting: *Deleted

## 2015-03-21 MED ORDER — GLUCOSE BLOOD VI STRP: ORAL_STRIP | Status: DC

## 2015-03-21 MED ORDER — AMLODIPINE-ATORVASTATIN 5-10 MG PO TABS: 1.0000 | ORAL_TABLET | Freq: Every day | ORAL | Status: DC

## 2015-03-21 NOTE — Telephone Encounter (Signed)
Spoke with the pt and he is needing a new prescription for glucose test strips.  Pt having been using the Relion Prime test strips.  New prescription phoned in to the pharmacy to [facility]) for Relion Prime test strips-Pt to check blood sugar one time daily.  #100,11.//AB/CMA

## 2015-03-24 NOTE — Telephone Encounter (Signed)
Patient called stating that his pharmacy informed him that his PCP needs to get Prior Auth for his test strips. Plse adv pt

## 2015-03-24 NOTE — Telephone Encounter (Signed)
Please have patient inform the pharmacy to fax the PA to [phone] so we can get this taken care of.

## 2015-03-25 ENCOUNTER — Telehealth: Payer: Self-pay | Admitting: Physician Assistant

## 2015-03-25 NOTE — Telephone Encounter (Signed)
° °  Reason for call:  [facility] [place], [place]. [phone] (Phone) [phone] (Fax)        Pharmacy faxed over PA for glucose blood test strip to [phone] Attention Lamount Cohen.

## 2015-03-29 ENCOUNTER — Telehealth: Payer: Self-pay | Admitting: Physician Assistant

## 2015-03-29 ENCOUNTER — Ambulatory Visit: Payer: BLUE CROSS/BLUE SHIELD | Admitting: Physician Assistant

## 2015-03-29 NOTE — Telephone Encounter (Signed)
Patient called stating that he did not call this morning to schedule the 4:15 appt with [facility]. He states that his sister scheduled it and he does not need to be seen so he wants the No Show fee waived for that reason.

## 2015-03-30 ENCOUNTER — Other Ambulatory Visit: Payer: Self-pay | Admitting: General Practice

## 2015-03-30 NOTE — Telephone Encounter (Signed)
PA started with [employer]. Tried for Relion strips since the preferred Bayer meter was not covered by insurance.

## 2015-03-30 NOTE — Telephone Encounter (Signed)
No charge this time. 

## 2015-03-30 NOTE — Telephone Encounter (Signed)
Charge or no charge? °

## 2015-04-01 NOTE — Telephone Encounter (Signed)
Pt called in to speak with you directly in regards to your previous conversation with him.   CB: [phone]

## 2015-04-06 NOTE — Telephone Encounter (Signed)
Spoke with patient and he stated that [place] gave him the wrong information initially and are now tellin ghim that the application to renew patient assistance is still in process; informed patient to let me know if I need to repeat the Caduet oder placed on 03/09/15 when the process is completed, he understood and agreed. Also, patient was checking status of PA on testing strips due to Insurance benefit changes:  Edwin [name], CMA at 03/30/2015 10:46 AM     Status: Signed       Expand All Collapse All   PA started with [employer]. Tried for Relion strips since the preferred Bayer meter was not covered by insurance.       Informed patient that wit all the changes in benefits with Insurance this year, that it is possible that it can take up to 4 weeks before we are receiving a response on some request; informed him to call customer service number on back of card and he can check the status of the PA with his insurance, pt understood & agreed/SLS 02/01

## 2015-04-11 ENCOUNTER — Other Ambulatory Visit: Payer: Self-pay | Admitting: Physician Assistant

## 2015-04-11 NOTE — Telephone Encounter (Signed)
Pt says that pharmacy suggested a 90 day supply on his metFORMIN Rx.

## 2015-04-12 NOTE — Telephone Encounter (Signed)
Called pt and advised Rx is at pharmacy for pick up.

## 2015-04-18 MED ORDER — BAYER CONTOUR MONITOR W/DEVICE KIT: 1.0000 | PACK | Freq: Once | Status: DC

## 2015-04-18 MED ORDER — GLUCOSE BLOOD VI STRP: ORAL_STRIP | Status: DC

## 2015-04-18 MED ORDER — BAYER MICROLET LANCETS MISC: Status: DC

## 2015-04-18 NOTE — Addendum Note (Signed)
Addended by: Rockwell Germany on: 04/18/2015 09:12 AM   Modules accepted: Orders

## 2015-04-18 NOTE — Telephone Encounter (Addendum)
Spent 25 minutes prior on phone with [employer] representative looking for the list of formularies and agreed to have her fax list to clinic when located; received only 'Clinical Review Dept with Drug determination notice' on 04/13/15; phoned [employer] [place] pharmacy program for covered member and was able to get someone to help me determine that the Bayer Contour is the covered formulary for patient's insurance/SLS Sent new Rx for Bayer monitor kit and testing supplies to pt's pharmacy, patient informed/SLS 02/13 

## 2015-04-20 NOTE — Telephone Encounter (Signed)
Relation to [id number] Call back number: [phone]   Reason for call:   As per [place] please refax information to an emergency fax [phone] Attention Dorothea Ogle

## 2015-05-05 NOTE — Telephone Encounter (Signed)
Pt calling again regarding below and requesting call back from [place] about lost paperwork? Ph# [phone]

## 2015-05-06 NOTE — Telephone Encounter (Signed)
Spoke with patient about this earlier this week and informed him that paperwork must have already went down for scan

## 2015-05-06 NOTE — Progress Notes (Signed)
[place] stated that something was wrong with the first set of recertification application paperwork faxed over, received message from [place], pt called and was informed that original application had been sent to scan. Patient called Dorothea Ogle at [employer] and we were able to get new paperwork faxed over; patient came in to office to complete and sign his part [employer] information is on file, so did not need copy], completed our portion and had provider sign and resent via fax at attention of Dorothea Ogle at fax [phone] 03/03

## 2015-05-09 ENCOUNTER — Telehealth: Payer: Self-pay | Admitting: Physician Assistant

## 2015-05-09 NOTE — Telephone Encounter (Signed)
Pt dropped off paperwork to be filled out. (For [employer] - Pt assistance Program)

## 2015-05-09 NOTE — Progress Notes (Signed)
Patient came in to office this Am with signed Medical Release form that was missing from [place] assistance packet faxed on 05/06/15; spoke with patient [as note stated to call him to pick-up when ready] and inquired as to if he would like [place] to fill in Cody's name and fax completed form to [place] at [place] and mail patient copy; pt understood & agreed/SLS 03/06

## 2015-05-09 NOTE — Telephone Encounter (Signed)
Pt brought in HIPAA Authorization form to release medical records to [place], signed by patient; wrote in provider's name & contact information and faxed to [place] to be attached to his Application faxed last week at [place]'s attention; placed copy in mail per phone conversation with pt today/SLS 03/06

## 2015-05-23 ENCOUNTER — Telehealth: Payer: Self-pay | Admitting: Physician Assistant

## 2015-05-23 NOTE — Telephone Encounter (Signed)
Pt was advised and stated an understanding. Pt advised that he has 2 weeks of medication left.

## 2015-05-23 NOTE — Telephone Encounter (Signed)
Please call [employer] to see when medication was shipped. I have not received anything.

## 2015-05-23 NOTE — Telephone Encounter (Signed)
Please call the patient to let him know. Also make sure he is not out of medication. Thank you.

## 2015-05-23 NOTE — Telephone Encounter (Signed)
Pt called in to check the status of his medication CADUET [employer] [phone]. )    Pt says that they informed him that his medication has been approved for a 90 day supply and that they have sent the Rx to our office to provide to pt.   Not showing any notes indicating the status of pt's Rx. Please advise further.    Thanks.   CB: [phone]

## 2015-05-23 NOTE — Telephone Encounter (Signed)
Called and spoke with [employer]. Pt is enrolled in the patient assistance program. Medication is authorized but is currently with their pharmacy as of 05/20/15. Monica advised that it can take up to 10 business days before the medication is mailed to our office.

## 2015-05-30 ENCOUNTER — Telehealth: Payer: Self-pay | Admitting: *Deleted

## 2015-05-30 NOTE — Telephone Encounter (Signed)
Called and [facility] @ 7:34m @ [phone]) informed the pt that the Caduet 5/10mg  sample from [facility] have arrived and ready for him to pick up.//AB/CMA

## 2015-06-27 ENCOUNTER — Other Ambulatory Visit: Payer: Self-pay | Admitting: Physician Assistant

## 2015-06-27 NOTE — Telephone Encounter (Signed)
Pt requesting a refill on 2 medications CADUET, Ibuprofen    CB: [phone]   Pharmacy: [facility] [place], [place].

## 2015-06-28 MED ORDER — IBUPROFEN 800 MG PO TABS: 800.0000 mg | ORAL_TABLET | Freq: Three times a day (TID) | ORAL | Status: DC | PRN

## 2015-06-30 MED ORDER — AMLODIPINE-ATORVASTATIN 5-10 MG PO TABS: 1.0000 | ORAL_TABLET | Freq: Every day | ORAL | Status: DC

## 2015-06-30 NOTE — Telephone Encounter (Signed)
Ibuprofen last filled on 06/28/15.  Caduet Last filled: 03/21/15 Amt: 30,3 Last OV: 12/21/14 Next OV: 07/22/15 [employer]izer Patient Assistance ID [id number], Order confirmation [id number] [employer] [phone]  Rx printed and placed in Martin's red folder for review and signature.     Left a message for call back.

## 2015-07-01 NOTE — Telephone Encounter (Signed)
[facility] and placed order via Automated system, earliest [employer] can ship Caduet is 07/23/15. LMOM with contact name and number for patient to return call RE: date of shipment on Caduet and inform us if he needs a supply to local pharmacy prior to [employer] shipment [Order #[id number][] /SLS 04/28

## 2015-07-04 ENCOUNTER — Telehealth: Payer: Self-pay | Admitting: Physician Assistant

## 2015-07-04 MED ORDER — OLOPATADINE HCL 0.2 % OP SOLN: 2.0000 [drp] | Freq: Every day | OPHTHALMIC | Status: DC

## 2015-07-04 NOTE — Telephone Encounter (Signed)
Can be reached: [phone] Pharmacy: [facility] [address] [place].  Reason for call: Pt states that he talked to [place] about having eye drops sent in for him. Med is olopatadine hcl 0.2% soln. He uses 2 drops in each eye for allergies he states.

## 2015-07-04 NOTE — Telephone Encounter (Signed)
Rx request to pharmacy/SLS  

## 2015-07-06 ENCOUNTER — Other Ambulatory Visit: Payer: Self-pay | Admitting: *Deleted

## 2015-07-06 NOTE — Progress Notes (Signed)
Received fax from [employer] stating that Olopatadine eye drops were expensive and would Ketorolac be equivalent; provider states No to Ketorolac, only needs for allergies, can try Optivar-A. Called pharmacy to ask if Optivar would be in a lower price range for patient; was informed that patient has already p/u the Olopatadine prescription from pharmacy, provider informed/SLS 05/03

## 2015-07-21 NOTE — Telephone Encounter (Signed)
Called to follow-up with the patient regarding the below concern. Left message for a call back at the patient's earliest convenience.

## 2015-07-21 NOTE — Telephone Encounter (Signed)
Called pt to remind him of appt 5/19 - he asked if the Caduet was here for him to pick up at appt. I didn't not see it in the front. Pt would like call back to let him know.  Ph# [phone]

## 2015-07-22 ENCOUNTER — Ambulatory Visit (INDEPENDENT_AMBULATORY_CARE_PROVIDER_SITE_OTHER): Payer: BLUE CROSS/BLUE SHIELD | Admitting: Physician Assistant

## 2015-07-22 ENCOUNTER — Encounter: Payer: Self-pay | Admitting: Physician Assistant

## 2015-07-22 VITALS — BP 126/80 | HR 73 | Temp 98.1°F | Ht 71.0 in | Wt 284.0 lb

## 2015-07-22 DIAGNOSIS — I10 Essential (primary) hypertension: Secondary | ICD-10-CM

## 2015-07-22 DIAGNOSIS — Z6839 Body mass index (BMI) 39.0-39.9, adult: Secondary | ICD-10-CM

## 2015-07-22 DIAGNOSIS — E785 Hyperlipidemia, unspecified: Secondary | ICD-10-CM

## 2015-07-22 DIAGNOSIS — E119 Type 2 diabetes mellitus without complications: Secondary | ICD-10-CM

## 2015-07-22 LAB — LIPID PANEL
CHOL/HDL RATIO: 5
Cholesterol: 176 mg/dL (ref 0–200)
HDL: 33.4 mg/dL — AB (ref >39.00)
LDL Cholesterol: 123 mg/dL — ABNORMAL HIGH (ref 0–99)
NONHDL: 142.79
Triglycerides: 98 mg/dL (ref 0.0–149.0)
VLDL: 19.6 mg/dL (ref 0.0–40.0)

## 2015-07-22 LAB — BASIC METABOLIC PANEL
BUN: 14 mg/dL (ref 6–23)
CHLORIDE: 104 meq/L (ref 96–112)
CO2: 26 mEq/L (ref 19–32)
CREATININE: 0.99 mg/dL (ref 0.40–1.50)
Calcium: 9.3 mg/dL (ref 8.4–10.5)
GFR: 103 mL/min (ref >60.00)
GLUCOSE: 139 mg/dL — AB (ref 70–99)
POTASSIUM: 3.9 meq/L (ref 3.5–5.1)
Sodium: 138 mEq/L (ref 135–145)

## 2015-07-22 LAB — HEMOGLOBIN A1C: Hgb A1c MFr Bld: 6.8 % — ABNORMAL HIGH (ref 4.6–6.5)

## 2015-07-22 LAB — MICROALBUMIN / CREATININE URINE RATIO
Creatinine,U: 90.5 mg/dL
Microalb Creat Ratio: 0.8 mg/g (ref 0.0–30.0)

## 2015-07-22 NOTE — Progress Notes (Signed)
Pre visit review using our clinic review tool, if applicable. No additional management support is needed unless otherwise documented below in the visit note. 

## 2015-07-22 NOTE — Telephone Encounter (Signed)
Patient seen at visit. Caduet to arrive next week.

## 2015-07-22 NOTE — Progress Notes (Signed)
Patient presents to clinic today for follow-up of hypertension and diabetes mellitus II, previously well controlled.   Patient endorses taking his Caduet as directed. Patient denies chest pain, palpitations, lightheadedness, dizziness, vision changes or frequent headaches.  BP Readings from Last 3 Encounters:  07/22/15 126/80  12/21/14 124/90  11/10/14 122/70   Patient also endorses taking Metformin as directed. Is endorsing fasting sugars averaging 100-120. Is walking 2 miles per day. Is also engaging in cardio at the gym a few times per week.  Body mass index is 39.63 kg/(m^2).  Past Medical History  Diagnosis Date  . Hypertension   . High cholesterol   . DM (diabetes mellitus) ([place])     type 2  . Low testosterone     Current Outpatient Prescriptions on File Prior to Visit  Medication Sig Dispense Refill  . acetaminophen (TYLENOL) 325 MG tablet Take 650 mg by mouth every 6 (six) hours as needed (pain).    Marland Kitchen amLODipine-atorvastatin (CADUET) 5-10 MG tablet Take 1 tablet by mouth daily. 30 tablet 3  . BAYER MICROLET LANCETS lancets Use as instructed to test blood glucose once daily Dx: E11.9 100 each 12  . Blood Glucose Monitoring Suppl (BAYER CONTOUR MONITOR) w/Device KIT 1 kit by Does not apply route once. USE AS DIRECTED TO TEST BLOOD GLUCOSE ONCE DAILY Dx: E11.9 1 kit 0  . fluticasone (FLONASE) 50 MCG/ACT nasal spray Place 2 sprays into both nostrils daily as needed for allergies or rhinitis. 16 g 2  . glucose blood (BAYER CONTOUR TEST) test strip Use as instructed to test blood glucose once daily Dx: E11.9 100 each 12  . ibuprofen (ADVIL,MOTRIN) 800 MG tablet Take 1 tablet (800 mg total) by mouth every 8 (eight) hours as needed for mild pain or moderate pain. 90 tablet 1  . metFORMIN (GLUCOPHAGE) 1000 MG tablet TAKE ONE TABLET BY MOUTH TWICE DAILY WITH FOOD (Patient taking differently: TAKE ONE TABLET BY MOUTH DAILY WITH FOOD) 60 tablet 3  . multivitamin (ONE-A-DAY MEN'S)  TABS tablet Take 1 tablet by mouth daily.    . Olopatadine HCl 0.2 % SOLN Apply 2 drops to eye daily. 2.5 mL 2   No current facility-administered medications on file prior to visit.    Allergies  Allergen Reactions  . Avelox [Moxifloxacin Hcl In Nacl] Hives and Itching    Family History  Problem Relation Age of Onset  . Aneurysm Mother 26    Deceased  . Healthy Father     Living  . Prostate cancer Paternal Uncle   . Healthy Daughter     x3    Social History   Social History  . Marital Status: Single    Spouse Name: N/A  . Number of Children: N/A  . Years of Education: N/A   Social History Main Topics  . Smoking status: Former Smoker -- 0.50 packs/day for 15 years    Quit date: 04/17/2005  . Smokeless tobacco: Never Used  . Alcohol Use: No  . Drug Use: No  . Sexual Activity: Not Asked   Other Topics Concern  . None   Social History Narrative    Review of Systems - See HPI.  All other ROS are negative.  BP 126/80 mmHg  Pulse 73  Temp(Src) 98.1 F (36.7 C) (Oral)  Ht 5' 11"  (1.803 m)  Wt 284 lb (128.822 kg)  BMI 39.63 kg/m2  SpO2 96%  Physical Exam  Constitutional: He is oriented to person, place, and time and  well-developed, well-nourished, and in no distress.  HENT:  Head: Normocephalic and atraumatic.  Eyes: Conjunctivae are normal.  Neck: Neck supple.  Cardiovascular: Normal rate, regular rhythm, normal heart sounds and intact distal pulses.   Pulmonary/Chest: Effort normal and breath sounds normal. No respiratory distress. He has no wheezes. He has no rales. He exhibits no tenderness.  Neurological: He is alert and oriented to person, place, and time.  Skin: Skin is warm and dry. No rash noted.  Psychiatric: Affect normal.  Vitals reviewed.  Assessment/Plan: 1. Hyperlipidemia Will repeat lipids today. Is taking Caduet as directed and working on diet and exercise regimen. - Lipid panel  2. Essential hypertension, benign Well-controlled.  Asymptomatic. Continue current regimen.  3. Controlled type 2 diabetes mellitus without complication, without long-term current use of insulin ([place]) Will check labs today to assess glycemic control. Continue good diet and exercise regimen. Foot exam and eye exam up-to-date. No history of nephropathy, neuropathy or retinopathy.  - Urine Microalbumin w/creat. ratio - Basic Metabolic Panel (BMET) - Hemoglobin A1c  4. Body mass index (BMI) of 39.0-39.9 in adult Has started a new diet and exercise regimen. Continue. Will monitor at subsequent visits.     Leeanne Rio, PA-C

## 2015-07-22 NOTE — Patient Instructions (Addendum)
Please continue medications as directed. Please go to the lab for blood work. I will call with results. We may be able to decrease Metformin further.   We will follow-up in 6 months. I will call once Caduet gets in.

## 2015-07-26 ENCOUNTER — Encounter: Payer: Self-pay | Admitting: *Deleted

## 2015-07-29 ENCOUNTER — Telehealth: Payer: Self-pay

## 2015-07-29 NOTE — Telephone Encounter (Signed)
Spoke w/ Pt, informed him Caduet samples have come to office, informed him they would be at front desk for pick up at his convenience. Pt verbalized understanding.

## 2015-08-08 ENCOUNTER — Telehealth: Payer: Self-pay | Admitting: Physician Assistant

## 2015-08-08 MED ORDER — AMOXICILLIN-POT CLAVULANATE 875-125 MG PO TABS: 1.0000 | ORAL_TABLET | Freq: Two times a day (BID) | ORAL | Status: DC

## 2015-08-08 NOTE — Telephone Encounter (Signed)
Please assess symptoms -- is he having any sinus pressure, sinus pain, fever, chills, cough, etc? Could just be nasal drainage from allergies or could be related to infection. Need more information.

## 2015-08-08 NOTE — Telephone Encounter (Signed)
Patient has sinus pressure & pain in teeth, no fever, chills, cough and/or chest congestion; sinus mucus is clear when blowing and green with Nettie pot rinse/SLS Per provider VO, Augmentin [1] BID for 7 days, continue other measures; Patient informed, understood & agreed/SLS 06/05

## 2015-08-08 NOTE — Telephone Encounter (Signed)
Relation to [id number] Call back [phone] Pharmacy: [facility] [place], [place]. [phone] (Phone) [phone] (Fax)         Reason for call:  Patient was last seen 07/22/15 in need of clinical advice, regarding stuffy nose, when patient tilts head green mucus comes out. Please advise

## 2015-08-16 ENCOUNTER — Telehealth: Payer: Self-pay | Admitting: Physician Assistant

## 2015-08-16 NOTE — Telephone Encounter (Signed)
Called and spoke with the pt and informed him of Cody's note below.   Pt verbalized understanding and agreed.  Pt stated that he had some loose BM after starting the ATB, but has noticed a difference after finishing the ATB.   Informed the pt if the loose stools continue please give [place] a call back.  Pt agreed.  FYI//AB/CMA

## 2015-08-16 NOTE — Telephone Encounter (Signed)
Medication will still be in system for a few days. Continue saline rinses and supportive measures. If not resolving he will need to come in to the office.

## 2015-08-16 NOTE — Telephone Encounter (Signed)
°  Relationship to patient: Self Can be reached: [phone]     Reason for call: Patient completed antibiotics yesterday and wanted to inform provider that he still has the greenish nasal drainage. States he was told to inform after completing antibiotic. Plse adv

## 2015-08-29 ENCOUNTER — Other Ambulatory Visit: Payer: Self-pay | Admitting: Physician Assistant

## 2015-08-29 NOTE — Telephone Encounter (Signed)
Rx request to pharmacy/SLS  

## 2015-10-04 ENCOUNTER — Telehealth: Payer: Self-pay | Admitting: Physician Assistant

## 2015-10-04 MED ORDER — AMLODIPINE-ATORVASTATIN 5-10 MG PO TABS: 1.0000 | ORAL_TABLET | Freq: Every day | ORAL | 3 refills | Status: DC

## 2015-10-04 NOTE — Telephone Encounter (Signed)
Relation to [id number] Call back number:[phone] Pharmacy:  Reason for call:  Patient requesting a refill amLODipine-atorvastatin (CADUET) 5-10 MG tablet. Patient states Ivin Booty or Janace Hoard will order and medication will be pick up from office. Please advise

## 2015-10-04 NOTE — Telephone Encounter (Signed)
Rx Order placed via phone with [facility], confirmation number [id number]; Patient informed, understood, we will call when Rx arrives at office/SLS 08/01

## 2015-10-17 ENCOUNTER — Telehealth: Payer: Self-pay | Admitting: *Deleted

## 2015-10-17 NOTE — Telephone Encounter (Signed)
Received Caduet 5-10mg  via [facility] program [x3]; Patient informed, understood & agreed to p/u durin regular business hours/SLS 08/14

## 2015-12-14 ENCOUNTER — Telehealth: Payer: Self-pay | Admitting: Physician Assistant

## 2015-12-14 MED ORDER — METFORMIN HCL 1000 MG PO TABS: 1000.0000 mg | ORAL_TABLET | Freq: Two times a day (BID) | ORAL | 1 refills | Status: DC

## 2015-12-14 MED ORDER — AMLODIPINE-ATORVASTATIN 5-10 MG PO TABS: 1.0000 | ORAL_TABLET | Freq: Every day | ORAL | 3 refills | Status: DC

## 2015-12-14 NOTE — Telephone Encounter (Signed)
Patient is requesting refill of metFORMIN (GLUCOPHAGE) 1000 MG tablet Please advise   Patient relation: self Patient phone: [phone] Pharmacy: [place], [place].

## 2015-12-14 NOTE — Telephone Encounter (Addendum)
[employer] [phone] Patient Assistance ID [id number] [Caduet]; new order placed for [3] month supply, Order [id number]. LMOM with contact name and number [for return call, if needed] RE: Caduet order placed/SLS 10/11

## 2015-12-14 NOTE — Addendum Note (Signed)
Addended by: Rockwell Germany on: 12/14/2015 05:01 PM   Modules accepted: Orders

## 2015-12-14 NOTE — Telephone Encounter (Signed)
Patient would like to get this ordered. Please advise.

## 2015-12-14 NOTE — Telephone Encounter (Signed)
Refill of Metformin has been sent. Please inform patient.

## 2015-12-14 NOTE — Telephone Encounter (Signed)
Patient aware.

## 2015-12-23 NOTE — Telephone Encounter (Signed)
Please call patient to inform him that his Caduet is at the office ready for pickup.

## 2015-12-26 NOTE — Telephone Encounter (Signed)
Patient informed, Rx is not at the front desk just a FYI.

## 2015-12-26 NOTE — Telephone Encounter (Signed)
Edwin [name], please make sure Mr Manwarren's Caduet is placed at front for pick up

## 2015-12-27 NOTE — Telephone Encounter (Addendum)
Medication was at front desk in container holding bagged medications, placed in "L" folder and front desk informed, pt informed via phone message/SLS 10/24

## 2015-12-30 ENCOUNTER — Ambulatory Visit (INDEPENDENT_AMBULATORY_CARE_PROVIDER_SITE_OTHER): Payer: BLUE CROSS/BLUE SHIELD | Admitting: Physician Assistant

## 2015-12-30 ENCOUNTER — Encounter: Payer: Self-pay | Admitting: Physician Assistant

## 2015-12-30 VITALS — BP 128/80 | HR 88 | Temp 98.3°F | Resp 16 | Ht 71.0 in | Wt 273.0 lb

## 2015-12-30 DIAGNOSIS — E785 Hyperlipidemia, unspecified: Secondary | ICD-10-CM

## 2015-12-30 DIAGNOSIS — I10 Essential (primary) hypertension: Secondary | ICD-10-CM | POA: Diagnosis not present

## 2015-12-30 DIAGNOSIS — E119 Type 2 diabetes mellitus without complications: Secondary | ICD-10-CM

## 2015-12-30 LAB — COMPREHENSIVE METABOLIC PANEL
ALT: 24 U/L (ref 0–53)
AST: 18 U/L (ref 0–37)
Albumin: 4.5 g/dL (ref 3.5–5.2)
Alkaline Phosphatase: 77 U/L (ref 39–117)
BILIRUBIN TOTAL: 0.5 mg/dL (ref 0.2–1.2)
BUN: 15 mg/dL (ref 6–23)
CO2: 30 meq/L (ref 19–32)
Calcium: 10.1 mg/dL (ref 8.4–10.5)
Chloride: 102 mEq/L (ref 96–112)
Creatinine, Ser: 1.05 mg/dL (ref 0.40–1.50)
GFR: 96.07 mL/min (ref >60.00)
GLUCOSE: 131 mg/dL — AB (ref 70–99)
Potassium: 4.6 mEq/L (ref 3.5–5.1)
Sodium: 138 mEq/L (ref 135–145)
Total Protein: 8.2 g/dL (ref 6.0–8.3)

## 2015-12-30 LAB — LIPID PANEL
CHOL/HDL RATIO: 4
Cholesterol: 175 mg/dL (ref 0–200)
HDL: 42 mg/dL (ref >39.00)
LDL CALC: 113 mg/dL — AB (ref 0–99)
NonHDL: 132.91
TRIGLYCERIDES: 102 mg/dL (ref 0.0–149.0)
VLDL: 20.4 mg/dL (ref 0.0–40.0)

## 2015-12-30 LAB — HEMOGLOBIN A1C: Hgb A1c MFr Bld: 6.7 % — ABNORMAL HIGH (ref 4.6–6.5)

## 2015-12-30 NOTE — Progress Notes (Signed)
Patient presents to clinic today for 77-monthfollow-up of chronic medical conditions.  Hypertension -- Patient currently on Caduet daily. Is taking as directed without noted side effect. Patient denies chest pain, palpitations, lightheadedness, dizziness, vision changes or frequent headaches.  BP Readings from Last 3 Encounters:  12/30/15 128/80  07/22/15 126/80  12/21/14 124/90   Hyperlipidemia -- Taking Caduet daily without side effect. Is on 81 mg ASA daily. Is trying to stay active. Endorses well-balanced diet. Body mass index is 38.08 kg/m.  Diabetes Mellitus II -- previously well controlled. Is taking Metformin as directed. Endorses fasting sugars averaging 110. Eye exam up-to-date per patient. Is having notes faxed to the office. Foot exam overdue. Denies concerns today. Declines flu shot and pneumonia shot.  Past Medical History:  Diagnosis Date  . DM (diabetes mellitus) ([place]    type 2  . High cholesterol   . Hypertension   . Low testosterone     Current Outpatient Prescriptions on File Prior to Visit  Medication Sig Dispense Refill  . acetaminophen (TYLENOL) 325 MG tablet Take 650 mg by mouth every 6 (six) hours as needed (pain).    .Marland KitchenamLODipine-atorvastatin (CADUET) 5-10 MG tablet Take 1 tablet by mouth daily. 30 tablet 3  . aspirin EC 81 MG tablet Take 1 tablet by mouth daily.    .Marland KitchenBAYER MICROLET LANCETS lancets Use as instructed to test blood glucose once daily Dx: E11.9 100 each 12  . Blood Glucose Monitoring Suppl (BAYER CONTOUR MONITOR) w/Device KIT 1 kit by Does not apply route once. USE AS DIRECTED TO TEST BLOOD GLUCOSE ONCE DAILY Dx: E11.9 1 kit 0  . fluticasone (FLONASE) 50 MCG/ACT nasal spray Place 2 sprays into both nostrils daily as needed for allergies or rhinitis. 16 g 2  . glucose blood (BAYER CONTOUR TEST) test strip Use as instructed to test blood glucose once daily Dx: E11.9 100 each 12  . ibuprofen (ADVIL,MOTRIN) 800 MG tablet Take 1 tablet (800 mg  total) by mouth every 8 (eight) hours as needed for mild pain or moderate pain. 90 tablet 1  . metFORMIN (GLUCOPHAGE) 1000 MG tablet Take 1 tablet (1,000 mg total) by mouth 2 (two) times daily with a meal. 180 tablet 1  . multivitamin (ONE-A-DAY MEN'S) TABS tablet Take 1 tablet by mouth daily.    . Olopatadine HCl 0.2 % SOLN Apply 2 drops to eye daily. 2.5 mL 2   No current facility-administered medications on file prior to visit.     Allergies  Allergen Reactions  . Avelox [Moxifloxacin Hcl In Nacl] Hives and Itching    Family History  Problem Relation Age of Onset  . Aneurysm Mother 313   Deceased  . Healthy Father     Living  . Prostate cancer Paternal Uncle   . Healthy Daughter     x3    Social History   Social History  . Marital status: Single    Spouse name: N/A  . Number of children: N/A  . Years of education: N/A   Social History Main Topics  . Smoking status: Former Smoker    Packs/day: 0.50    Years: 15.00    Quit date: 04/17/2005  . Smokeless tobacco: Never Used  . Alcohol use No  . Drug use: No  . Sexual activity: Not Asked   Other Topics Concern  . None   Social History Narrative  . None    Review of Systems - See HPI.  All other  ROS are negative.  BP 128/80 (BP Location: Right Arm, Patient Position: Sitting, Cuff Size: Large)   Pulse 88   Temp 98.3 F (36.8 C) (Oral)   Resp 16   Ht 5' 11"  (1.803 m)   Wt 273 lb (123.8 kg)   SpO2 (!) 88%   BMI 38.08 kg/m   Physical Exam  Constitutional: He is oriented to person, place, and time and well-developed, well-nourished, and in no distress.  HENT:  Head: Normocephalic and atraumatic.  Right Ear: External ear normal.  Left Ear: External ear normal.  Eyes: Conjunctivae are normal.  Cardiovascular: Normal rate, regular rhythm, normal heart sounds and intact distal pulses.   Pulmonary/Chest: Effort normal and breath sounds normal. No respiratory distress. He has no wheezes. He has no rales. He  exhibits no tenderness.  Neurological: He is alert and oriented to person, place, and time.  Skin: Skin is warm and dry. No rash noted.  Psychiatric: Affect normal.  Vitals reviewed.  Diabetic Foot Form - Detailed   Diabetic Foot Exam - detailed Diabetic Foot exam was performed with the following findings:  Yes 12/30/2015  1:21 PM  Visual Foot Exam completed.:  Yes  Is there a history of foot ulcer?:  No Can the patient see the bottom of their feet?:  Yes Are the shoes appropriate in style and fit?:  Yes Is there swelling or and abnormal foot shape?:  No Are the toenails long?:  No Are the toenails thick?:  No Do you have pain in calf while walking?:  No Is there a claw toe deformity?:  No Is there elevated skin temparature?:  No Is there limited skin dorsiflexion?:  No Is there foot or ankle muscle weakness?:  No Are the toenails ingrown?:  No Normal Range of Motion:  Yes Pulse Foot Exam completed.:  Yes  Right posterior Tibialias:  Present Left posterior Tibialias:  Present  Right Dorsalis Pedis:  Present Left Dorsalis Pedis:  Present  Sensory Foot Exam Completed.:  Yes Swelling:  No Semmes-Weinstein Monofilament Test R Foot Test Control:  Neg L Foot Test Control:  Neg  R Site 1-Great Toe:  Neg L Site 1-Great Toe:  Neg  R Site 4:  Neg L Site 4:  Neg  R Site 5:  Neg L Site 5:  Neg        Assessment/Plan: Hyperlipidemia Tolerating medications well. Dietary and exercise recommendations reviewed with patient. Continue current regimen. Will check labs today  Essential hypertension, benign BP normotensive. Asymptomatic. Continue current regimen. Will check labs today.  Diabetes mellitus type II, controlled Will obtain repeat labs today. Fasting sugars at goal. Previous A1C at goal.  Continue current medications. Foot exam updated. Within normal limits.  Will obtain diabetic eye exam records. Declines flu and pneumonia vaccines today.    Leeanne Rio, PA-C

## 2015-12-30 NOTE — Patient Instructions (Signed)
Please go to the lab for blood work. I will all you with your results. We will alter your regimen based on results.  You will be contacted for a screening colonoscopy. We will plan on follow-up in 6 months.    Diabetes and Exercise Exercising regularly is important. It is not just about losing weight. It has many health benefits, such as:  Improving your overall fitness, flexibility, and endurance.  Increasing your bone density.  Helping with weight control.  Decreasing your body fat.  Increasing your muscle strength.  Reducing stress and tension.  Improving your overall health. People with diabetes who exercise gain additional benefits because exercise:  Reduces appetite.  Improves the body's use of blood sugar (glucose).  Helps lower or control blood glucose.  Decreases blood pressure.  Helps control blood lipids (such as cholesterol and triglycerides).  Improves the body's use of the hormone insulin by:  Increasing the body's insulin sensitivity.  Reducing the body's insulin needs.  Decreases the risk for heart disease because exercising:  Lowers cholesterol and triglycerides levels.  Increases the levels of good cholesterol (such as high-density lipoproteins [HDL]) in the body.  Lowers blood glucose levels. YOUR ACTIVITY PLAN  Choose an activity that you enjoy, and set realistic goals. To exercise safely, you should begin practicing any new physical activity slowly, and gradually increase the intensity of the exercise over time. Your health care provider or diabetes educator can help create an activity plan that works for you. General recommendations include:  Encouraging children to engage in at least 60 minutes of physical activity each day.  Stretching and performing strength training exercises, such as yoga or weight lifting, at least 2 times per week.  Performing a total of at least 150 minutes of moderate-intensity exercise each week, such as brisk  walking or water aerobics.  Exercising at least 3 days per week, making sure you allow no more than 2 consecutive days to pass without exercising.  Avoiding long periods of inactivity (90 minutes or more). When you have to spend an extended period of time sitting down, take frequent breaks to walk or stretch. RECOMMENDATIONS FOR EXERCISING WITH TYPE 1 OR TYPE 2 DIABETES   Check your blood glucose before exercising. If blood glucose levels are greater than 240 mg/dL, check for urine ketones. Do not exercise if ketones are present.  Avoid injecting insulin into areas of the body that are going to be exercised. For example, avoid injecting insulin into:  The arms when playing tennis.  The legs when jogging.  Keep a record of:  Food intake before and after you exercise.  Expected peak times of insulin action.  Blood glucose levels before and after you exercise.  The type and amount of exercise you have done.  Review your records with your health care provider. Your health care provider will help you to develop guidelines for adjusting food intake and insulin amounts before and after exercising.  If you take insulin or oral hypoglycemic agents, watch for signs and symptoms of hypoglycemia. They include:  Dizziness.  Shaking.  Sweating.  Chills.  Confusion.  Drink plenty of water while you exercise to prevent dehydration or heat stroke. Body water is lost during exercise and must be replaced.  Talk to your health care provider before starting an exercise program to make sure it is safe for you. Remember, almost any type of activity is better than none.   This information is not intended to replace advice given to you by  your health care provider. Make sure you discuss any questions you have with your health care provider.   Document Released: 05/12/2003 Document Revised: 07/06/2014 Document Reviewed: 07/29/2012 Elsevier Interactive Patient Education [employer].

## 2015-12-30 NOTE — Assessment & Plan Note (Signed)
Tolerating medications well. Dietary and exercise recommendations reviewed with patient. Continue current regimen. Will check labs today

## 2015-12-30 NOTE — Assessment & Plan Note (Signed)
Will obtain repeat labs today. Fasting sugars at goal. Previous A1C at goal.  Continue current medications. Foot exam updated. Within normal limits.  Will obtain diabetic eye exam records. Declines flu and pneumonia vaccines today.

## 2015-12-30 NOTE — Assessment & Plan Note (Signed)
BP normotensive. Asymptomatic. Continue current regimen. Will check labs today.

## 2016-01-11 ENCOUNTER — Telehealth: Payer: Self-pay | Admitting: Physician Assistant

## 2016-01-11 NOTE — Telephone Encounter (Signed)
Pt called requesting copy of lab results mailed to him, verified address and mailed out today.

## 2016-01-17 ENCOUNTER — Ambulatory Visit: Payer: BLUE CROSS/BLUE SHIELD | Admitting: Physician Assistant

## 2016-01-20 ENCOUNTER — Telehealth: Payer: Self-pay | Admitting: Physician Assistant

## 2016-01-20 NOTE — Telephone Encounter (Signed)
Patient states his fiance was dx with Bacterial Vaginitis and prescribed abx. Unsure of abx she has not picked up rx yet. He wanted to know if he would need an abx since he is sensitive. Her PCP did not recommend him to start medication. Please advise

## 2016-01-20 NOTE — Telephone Encounter (Signed)
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 Pt is concerned that maybe he might need to take something as well and would like a call back. Pt did not want to go into detail with me. Just asking for a call back from [place].

## 2016-01-20 NOTE — Telephone Encounter (Signed)
Advised patient no needed treatment at this time. If he develops any symptoms to let us know and will be welcomed to see patient. He is agreeable

## 2016-01-20 NOTE — Telephone Encounter (Signed)
He should not need any treatment. Men generally do not experience any issues from a partner with bacterial vaginosis. If there are any symptoms present or if he has concerns he is welcome to come see me.

## 2016-03-12 ENCOUNTER — Telehealth: Payer: Self-pay | Admitting: Physician Assistant

## 2016-03-12 NOTE — Telephone Encounter (Signed)
Pt states that he needs a refill on CADUET and that this is ordered through that phone,

## 2016-03-14 NOTE — Telephone Encounter (Signed)
CMA informed pt yesterday that he has to contact [facility] and recertify before we can fill any medications.

## 2016-03-15 NOTE — Telephone Encounter (Signed)
Gwen, scheduler with [facility] office calling to report patient has showed up to their office requesting to pick up medication.  Patient is telling staff at [facility] that he received a call from clinical staff at [facility] yesterday telling him to report to [facility].    I spoke with Janett Billow who states patient was informed to [facility] for recertification, not to report to [facility] office.  Patient is adiment his is what he was told and is requesting call back from [place] for clarification on what he needs to do to get medication.

## 2016-03-16 ENCOUNTER — Encounter: Payer: Self-pay | Admitting: Emergency Medicine

## 2016-03-20 NOTE — Telephone Encounter (Signed)
Patient came by the office and completed the patient assistance paperwork and faxed to the [place]. Patient did give user name and password to website to print off his yearly amount but was not able to log in.

## 2016-03-23 ENCOUNTER — Encounter: Payer: Self-pay | Admitting: Physician Assistant

## 2016-04-02 ENCOUNTER — Telehealth: Payer: Self-pay | Admitting: Physician Assistant

## 2016-04-02 NOTE — Telephone Encounter (Signed)
Spoke with patient he states his symptoms started chest congestion, cold on Saturday 1 week ago. Taking Theraflu the last 3 days ago. Now taking Advil cold and sinus. Getting a lot of colored sputum up in morning. Denies any fever or chills.

## 2016-04-02 NOTE — Telephone Encounter (Signed)
Pt states that he feels like he is getting a sinus infection and asking if [name] would call in somethin or would pt need an appt. Also pt states that he is off of cadute.

## 2016-04-03 ENCOUNTER — Encounter: Payer: Self-pay | Admitting: Physician Assistant

## 2016-04-03 ENCOUNTER — Ambulatory Visit (INDEPENDENT_AMBULATORY_CARE_PROVIDER_SITE_OTHER): Payer: BLUE CROSS/BLUE SHIELD | Admitting: Physician Assistant

## 2016-04-03 VITALS — BP 136/82 | HR 73 | Temp 98.5°F | Resp 16 | Ht 71.0 in | Wt 277.0 lb

## 2016-04-03 DIAGNOSIS — J0110 Acute frontal sinusitis, unspecified: Secondary | ICD-10-CM

## 2016-04-03 MED ORDER — AZITHROMYCIN 250 MG PO TABS: ORAL_TABLET | ORAL | 0 refills | Status: DC

## 2016-04-03 NOTE — Progress Notes (Signed)
Pre visit review using our clinic review tool, if applicable. No additional management support is needed unless otherwise documented below in the visit note. 

## 2016-04-03 NOTE — Patient Instructions (Signed)
Please take antibiotic as directed.  Increase fluid intake.  Use Saline nasal spray.  Take a daily multivitamin. Flonase as directed.  Place a humidifier in the bedroom.  Please call or return clinic if symptoms are not improving.  Sinusitis Sinusitis is redness, soreness, and swelling (inflammation) of the paranasal sinuses. Paranasal sinuses are air pockets within the bones of your face (beneath the eyes, the middle of the forehead, or above the eyes). In healthy paranasal sinuses, mucus is able to drain out, and air is able to circulate through them by way of your nose. However, when your paranasal sinuses are inflamed, mucus and air can become trapped. This can allow bacteria and other germs to grow and cause infection. Sinusitis can develop quickly and last only a short time (acute) or continue over a long period (chronic). Sinusitis that lasts for more than 12 weeks is considered chronic.  CAUSES  Causes of sinusitis include:  Allergies.  Structural abnormalities, such as displacement of the cartilage that separates your nostrils (deviated septum), which can decrease the air flow through your nose and sinuses and affect sinus drainage.  Functional abnormalities, such as when the small hairs (cilia) that line your sinuses and help remove mucus do not work properly or are not present. SYMPTOMS  Symptoms of acute and chronic sinusitis are the same. The primary symptoms are pain and pressure around the affected sinuses. Other symptoms include:  Upper toothache.  Earache.  Headache.  Bad breath.  Decreased sense of smell and taste.  A cough, which worsens when you are lying flat.  Fatigue.  Fever.  Thick drainage from your nose, which often is green and may contain pus (purulent).  Swelling and warmth over the affected sinuses. DIAGNOSIS  Your caregiver will perform a physical exam. During the exam, your caregiver may:  Look in your nose for signs of abnormal growths in your  nostrils (nasal polyps).  Tap over the affected sinus to check for signs of infection.  View the inside of your sinuses (endoscopy) with a special imaging device with a light attached (endoscope), which is inserted into your sinuses. If your caregiver suspects that you have chronic sinusitis, one or more of the following tests may be recommended:  Allergy tests.  Nasal culture A sample of mucus is taken from your nose and sent to a lab and screened for bacteria.  Nasal cytology A sample of mucus is taken from your nose and examined by your caregiver to determine if your sinusitis is related to an allergy. TREATMENT  Most cases of acute sinusitis are related to a viral infection and will resolve on their own within 10 days. Sometimes medicines are prescribed to help relieve symptoms (pain medicine, decongestants, nasal steroid sprays, or saline sprays).  However, for sinusitis related to a bacterial infection, your caregiver will prescribe antibiotic medicines. These are medicines that will help kill the bacteria causing the infection.  Rarely, sinusitis is caused by a fungal infection. In theses cases, your caregiver will prescribe antifungal medicine. For some cases of chronic sinusitis, surgery is needed. Generally, these are cases in which sinusitis recurs more than 3 times per year, despite other treatments. HOME CARE INSTRUCTIONS   Drink plenty of water. Water helps thin the mucus so your sinuses can drain more easily.  Use a humidifier.  Inhale steam 3 to 4 times a day (for example, sit in the bathroom with the shower running).  Apply a warm, moist washcloth to your face 3 to 4   times a day, or as directed by your caregiver.  Use saline nasal sprays to help moisten and clean your sinuses.  Take over-the-counter or prescription medicines for pain, discomfort, or fever only as directed by your caregiver. SEEK IMMEDIATE MEDICAL CARE IF:  You have increasing pain or severe  headaches.  You have nausea, vomiting, or drowsiness.  You have swelling around your face.  You have vision problems.  You have a stiff neck.  You have difficulty breathing. MAKE SURE YOU:   Understand these instructions.  Will watch your condition.  Will get help right away if you are not doing well or get worse. Document Released: 02/19/2005 Document Revised: 05/14/2011 Document Reviewed: 03/06/2011 Exit[employer] Patient Information 2014 [employer].   

## 2016-04-03 NOTE — Progress Notes (Signed)
Patient presents to clinic today c/o 2 weeks of a head cold that was initially improving biut has worsened over the past few days. Initially with sinus pressure but now with sinus pain and noted change in color of nasal drainage (yellow-green and thick). Denies fever, chills, chest pain or SOB. Notes some intermittent L ear pain.  Past Medical History:  Diagnosis Date  . DM (diabetes mellitus) ([place])    type 2  . High cholesterol   . Hypertension   . Low testosterone     Current Outpatient Prescriptions on File Prior to Visit  Medication Sig Dispense Refill  . acetaminophen (TYLENOL) 325 MG tablet Take 650 mg by mouth every 6 (six) hours as needed (pain).    Marland Kitchen amLODipine-atorvastatin (CADUET) 5-10 MG tablet Take 1 tablet by mouth daily. 30 tablet 3  . aspirin EC 81 MG tablet Take 1 tablet by mouth daily.    Marland Kitchen BAYER MICROLET LANCETS lancets Use as instructed to test blood glucose once daily Dx: E11.9 100 each 12  . Blood Glucose Monitoring Suppl (BAYER CONTOUR MONITOR) w/Device KIT 1 kit by Does not apply route once. USE AS DIRECTED TO TEST BLOOD GLUCOSE ONCE DAILY Dx: E11.9 1 kit 0  . fluticasone (FLONASE) 50 MCG/ACT nasal spray Place 2 sprays into both nostrils daily as needed for allergies or rhinitis. 16 g 2  . glucose blood (BAYER CONTOUR TEST) test strip Use as instructed to test blood glucose once daily Dx: E11.9 100 each 12  . ibuprofen (ADVIL,MOTRIN) 800 MG tablet Take 1 tablet (800 mg total) by mouth every 8 (eight) hours as needed for mild pain or moderate pain. 90 tablet 1  . metFORMIN (GLUCOPHAGE) 1000 MG tablet Take 1 tablet (1,000 mg total) by mouth 2 (two) times daily with a meal. 180 tablet 1  . multivitamin (ONE-A-DAY MEN'S) TABS tablet Take 1 tablet by mouth daily.    . Olopatadine HCl 0.2 % SOLN Apply 2 drops to eye daily. 2.5 mL 2   No current facility-administered medications on file prior to visit.     Allergies  Allergen Reactions  . Avelox [Moxifloxacin Hcl In  Nacl] Hives and Itching    Family History  Problem Relation Age of Onset  . Aneurysm Mother 42    Deceased  . Healthy Father     Living  . Prostate cancer Paternal Uncle   . Healthy Daughter     x3    Social History   Social History  . Marital status: Single    Spouse name: N/A  . Number of children: N/A  . Years of education: N/A   Social History Main Topics  . Smoking status: Former Smoker    Packs/day: 0.50    Years: 15.00    Quit date: 04/17/2005  . Smokeless tobacco: Never Used  . Alcohol use No  . Drug use: No  . Sexual activity: Not Asked   Other Topics Concern  . None   Social History Narrative  . None   Review of Systems - See HPI.  All other ROS are negative.  BP 136/82   Pulse 73   Temp 98.5 F (36.9 C) (Oral)   Resp 16   Ht 5' 11"  (1.803 m)   Wt 277 lb (125.6 kg)   SpO2 98%   BMI 38.63 kg/m   Physical Exam  Constitutional: He is oriented to person, place, and time and well-developed, well-nourished, and in no distress.  HENT:  Head: Normocephalic and  atraumatic.  Right Ear: Tympanic membrane normal.  Left Ear: Tympanic membrane normal.  Nose: Mucosal edema and rhinorrhea present. Right sinus exhibits frontal sinus tenderness. Left sinus exhibits frontal sinus tenderness.  Mouth/Throat: Uvula is midline, oropharynx is clear and moist and mucous membranes are normal.  Eyes: Conjunctivae are normal.  Neck: Neck supple.  Cardiovascular: Normal rate, regular rhythm, normal heart sounds and intact distal pulses.   Pulmonary/Chest: Effort normal and breath sounds normal. No respiratory distress. He has no wheezes. He has no rales. He exhibits no tenderness.  Neurological: He is alert and oriented to person, place, and time.  Skin: Skin is warm and dry. No rash noted.  Psychiatric: Affect normal.  Vitals reviewed.   Assessment/Plan: 1. Acute non-recurrent frontal sinusitis Start ABX. Restart Flonase. Supportive measures and OTC medications  reviewed.   - azithromycin (ZITHROMAX) 250 MG tablet; Take 2 tablets on Day 1. Then take 1 tablet daily.  Dispense: 6 tablet; Refill: 0   Leeanne Rio, [place]

## 2016-04-22 ENCOUNTER — Encounter (HOSPITAL_COMMUNITY): Payer: Self-pay | Admitting: Emergency Medicine

## 2016-04-22 ENCOUNTER — Emergency Department (HOSPITAL_COMMUNITY): Payer: BLUE CROSS/BLUE SHIELD

## 2016-04-22 ENCOUNTER — Emergency Department (HOSPITAL_COMMUNITY)
Admission: EM | Admit: 2016-04-22 | Discharge: 2016-04-22 | Disposition: A | Discharge status: Home / Self Care | Payer: BLUE CROSS/BLUE SHIELD | Attending: Emergency Medicine | Admitting: Emergency Medicine

## 2016-04-22 DIAGNOSIS — I10 Essential (primary) hypertension: Secondary | ICD-10-CM | POA: Diagnosis not present

## 2016-04-22 DIAGNOSIS — M545 Low back pain: Secondary | ICD-10-CM | POA: Insufficient documentation

## 2016-04-22 DIAGNOSIS — Y92410 Unspecified street and highway as the place of occurrence of the external cause: Secondary | ICD-10-CM | POA: Insufficient documentation

## 2016-04-22 DIAGNOSIS — Z7984 Long term (current) use of oral hypoglycemic drugs: Secondary | ICD-10-CM | POA: Diagnosis not present

## 2016-04-22 DIAGNOSIS — Z7982 Long term (current) use of aspirin: Secondary | ICD-10-CM | POA: Insufficient documentation

## 2016-04-22 DIAGNOSIS — Y999 Unspecified external cause status: Secondary | ICD-10-CM | POA: Insufficient documentation

## 2016-04-22 DIAGNOSIS — E119 Type 2 diabetes mellitus without complications: Secondary | ICD-10-CM | POA: Diagnosis not present

## 2016-04-22 DIAGNOSIS — Z87891 Personal history of nicotine dependence: Secondary | ICD-10-CM | POA: Diagnosis not present

## 2016-04-22 DIAGNOSIS — Y939 Activity, unspecified: Secondary | ICD-10-CM | POA: Diagnosis not present

## 2016-04-22 MED ORDER — METHOCARBAMOL 500 MG PO TABS: 500.0000 mg | ORAL_TABLET | Freq: Every evening | ORAL | 0 refills | Status: DC | PRN

## 2016-04-22 MED ORDER — ACETAMINOPHEN 325 MG PO TABS
650.0000 mg | ORAL_TABLET | Freq: Once | ORAL | Status: AC
  Administered 2016-04-22: 650 mg via ORAL
  Filled 2016-04-22: qty 2

## 2016-04-22 MED ORDER — NAPROXEN 500 MG PO TABS: 500.0000 mg | ORAL_TABLET | Freq: Two times a day (BID) | ORAL | 0 refills | Status: DC
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
## 2016-04-23 ENCOUNTER — Ambulatory Visit (INDEPENDENT_AMBULATORY_CARE_PROVIDER_SITE_OTHER): Payer: BLUE CROSS/BLUE SHIELD | Admitting: Physician Assistant

## 2016-04-23 ENCOUNTER — Encounter: Payer: Self-pay | Admitting: Physician Assistant

## 2016-04-23 DIAGNOSIS — M545 Low back pain, unspecified: Secondary | ICD-10-CM

## 2016-04-23 DIAGNOSIS — M542 Cervicalgia: Secondary | ICD-10-CM | POA: Diagnosis not present

## 2016-04-23 MED ORDER — TRAMADOL HCL 50 MG PO TABS: 50.0000 mg | ORAL_TABLET | Freq: Two times a day (BID) | ORAL | 0 refills | Status: DC | PRN

## 2016-04-23 NOTE — Progress Notes (Signed)
Pre visit review using our clinic review tool, if applicable. No additional management support is needed unless otherwise documented below in the visit note. 
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
## 2016-04-23 NOTE — Patient Instructions (Addendum)
Please hydrate and rest. Move around as you tolerate it to work out muscular stiffness. Apply a topical Icy hot or Salon [place] to the sore areas. A heating pad will also be beneficial. Continue the Anaprox -- once daily with food. You can take a Tramadol up to twice daily if needed for moderate to severe pain. Please continue the Robaxin as directed.  Follow-up if symptoms are not gradually improving.

## 2016-04-25 ENCOUNTER — Telehealth: Payer: Self-pay | Admitting: Physician Assistant

## 2016-04-25 ENCOUNTER — Encounter: Payer: Self-pay | Admitting: Emergency Medicine

## 2016-04-25 NOTE — Telephone Encounter (Signed)
I can write him out for the rest of the week if he needs, returning on Monday
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
## 2016-04-25 NOTE — Telephone Encounter (Signed)
Updated letter put up front for pick up. Advised patient the letter is ready and extended to the rest of the week. He may return on Monday depending on pain level and soreness. He will contact office if symptoms have not improved

## 2016-05-05 ENCOUNTER — Other Ambulatory Visit: Payer: Self-pay | Admitting: Physician Assistant

## 2016-05-07 ENCOUNTER — Encounter: Payer: Self-pay | Admitting: Physician Assistant

## 2016-05-07 ENCOUNTER — Ambulatory Visit (INDEPENDENT_AMBULATORY_CARE_PROVIDER_SITE_OTHER): Payer: BLUE CROSS/BLUE SHIELD | Admitting: Physician Assistant

## 2016-05-07 VITALS — BP 130/88 | HR 74 | Temp 98.4°F | Resp 14 | Ht 71.0 in | Wt 278.0 lb

## 2016-05-07 DIAGNOSIS — E785 Hyperlipidemia, unspecified: Secondary | ICD-10-CM | POA: Diagnosis not present

## 2016-05-07 DIAGNOSIS — E119 Type 2 diabetes mellitus without complications: Secondary | ICD-10-CM | POA: Diagnosis not present

## 2016-05-07 DIAGNOSIS — I10 Essential (primary) hypertension: Secondary | ICD-10-CM

## 2016-05-07 DIAGNOSIS — Z23 Encounter for immunization: Secondary | ICD-10-CM | POA: Diagnosis not present

## 2016-05-07 LAB — LIPID PANEL
CHOL/HDL RATIO: 4
Cholesterol: 160 mg/dL (ref 0–200)
HDL: 41.4 mg/dL (ref >39.00)
LDL Cholesterol: 107 mg/dL — ABNORMAL HIGH (ref 0–99)
NONHDL: 118.11
Triglycerides: 54 mg/dL (ref 0.0–149.0)
VLDL: 10.8 mg/dL (ref 0.0–40.0)

## 2016-05-07 LAB — COMPREHENSIVE METABOLIC PANEL
ALK PHOS: 69 U/L (ref 39–117)
ALT: 26 U/L (ref 0–53)
AST: 19 U/L (ref 0–37)
Albumin: 4.3 g/dL (ref 3.5–5.2)
BUN: 13 mg/dL (ref 6–23)
CO2: 31 meq/L (ref 19–32)
Calcium: 9.6 mg/dL (ref 8.4–10.5)
Chloride: 105 mEq/L (ref 96–112)
Creatinine, Ser: 0.94 mg/dL (ref 0.40–1.50)
GFR: 109 mL/min (ref >60.00)
GLUCOSE: 120 mg/dL — AB (ref 70–99)
Potassium: 4.6 mEq/L (ref 3.5–5.1)
Sodium: 143 mEq/L (ref 135–145)
TOTAL PROTEIN: 7.1 g/dL (ref 6.0–8.3)
Total Bilirubin: 0.5 mg/dL (ref 0.2–1.2)

## 2016-05-07 LAB — HEMOGLOBIN A1C: HEMOGLOBIN A1C: 6.7 % — AB (ref 4.6–6.5)

## 2016-05-07 NOTE — Progress Notes (Signed)
Patient presents to clinic today for follow-up of hypertension, hyperlipidemia and DM II, controlled without complication.  Hypertension -- Currently on Caduet daily. Is taking as directed. Patient denies chest pain, palpitations, lightheadedness, dizziness, vision changes or frequent headaches.  BP Readings from Last 3 Encounters:  05/07/16 130/88  04/23/16 138/88  04/22/16 130/86   Hyperlipidemia -- On Caduet and 81 mg ASA daily. Is taking as directed. Is working on increased exercise. Notes diet good be better.  DM II -- Currently on metformin 1000 mg BID. Is only taking QD. Fasting sugars averaging 100-130. Foot exam up-to-date. Patient has diabetic eye examination scheduled this coming week. Declines flu shot and pneumonia vaccine. Patient without hx of nephropathy, neuropathy or retinopathy.  Past Medical History:  Diagnosis Date  . DM (diabetes mellitus) ([place])    type 2  . High cholesterol   . Hypertension   . Low testosterone     Current Outpatient Prescriptions on File Prior to Visit  Medication Sig Dispense Refill  . acetaminophen (TYLENOL) 325 MG tablet Take 650 mg by mouth every 6 (six) hours as needed (pain).    Marland Kitchen amLODipine-atorvastatin (CADUET) 5-10 MG tablet Take 1 tablet by mouth daily. 30 tablet 3  . aspirin EC 81 MG tablet Take 1 tablet by mouth daily.    Marland Kitchen BAYER MICROLET LANCETS lancets Use as instructed to test blood glucose once daily Dx: E11.9 100 each 12  . Blood Glucose Monitoring Suppl (BAYER CONTOUR MONITOR) w/Device KIT 1 kit by Does not apply route once. USE AS DIRECTED TO TEST BLOOD GLUCOSE ONCE DAILY Dx: E11.9 1 kit 0  . fluticasone (FLONASE) 50 MCG/ACT nasal spray Place 2 sprays into both nostrils daily as needed for allergies or rhinitis. 16 g 2  . glucose blood (BAYER CONTOUR TEST) test strip Use as instructed to test blood glucose once daily Dx: E11.9 100 each 12  . ibuprofen (ADVIL,MOTRIN) 800 MG tablet Take 1 tablet (800 mg total) by mouth every  8 (eight) hours as needed for mild pain or moderate pain. 90 tablet 1  . multivitamin (ONE-A-DAY MEN'S) TABS tablet Take 1 tablet by mouth daily.    . Olopatadine HCl 0.2 % SOLN Apply 2 drops to eye daily. 2.5 mL 2  . traMADol (ULTRAM) 50 MG tablet Take 1 tablet (50 mg total) by mouth every 12 (twelve) hours as needed. 10 tablet 0  . metFORMIN (GLUCOPHAGE) 1000 MG tablet TAKE ONE TABLET BY MOUTH TWICE DAILY WITH A MEAL 180 tablet 1   No current facility-administered medications on file prior to visit.     Allergies  Allergen Reactions  . Avelox [Moxifloxacin Hcl In Nacl] Hives and Itching    Family History  Problem Relation Age of Onset  . Aneurysm Mother 45    Deceased  . Healthy Father     Living  . Prostate cancer Paternal Uncle   . Healthy Daughter     x3    Social History   Social History  . Marital status: Single    Spouse name: N/A  . Number of children: N/A  . Years of education: N/A   Social History Main Topics  . Smoking status: Former Smoker    Packs/day: 0.50    Years: 15.00    Quit date: 04/17/2005  . Smokeless tobacco: Never Used  . Alcohol use No  . Drug use: No  . Sexual activity: Not Asked   Other Topics Concern  . None   Social History Narrative  .  None   Review of Systems - See HPI.  All other ROS are negative.  BP 130/88   Pulse 74   Temp 98.4 F (36.9 C) (Oral)   Resp 14   Ht 5' 11"  (1.803 m)   Wt 278 lb (126.1 kg)   SpO2 98%   BMI 38.77 kg/m   Physical Exam  Constitutional: He is oriented to person, place, and time and well-developed, well-nourished, and in no distress.  HENT:  Head: Normocephalic and atraumatic.  Eyes: Conjunctivae are normal.  Neck: Neck supple.  Cardiovascular: Normal rate, regular rhythm, normal heart sounds and intact distal pulses.   Pulmonary/Chest: Effort normal and breath sounds normal. No respiratory distress. He has no wheezes. He has no rales. He exhibits no tenderness.  Neurological: He is alert  and oriented to person, place, and time.  Skin: Skin is warm and dry. No rash noted.  Psychiatric: Affect normal.  Vitals reviewed.  Assessment/Plan: Essential hypertension, benign BP well controlled. Labs today.  Diet and exercise reviewed. Continue current regimen.  Diabetes mellitus type II, controlled Repeat labs today.  Diet and exercise reviewed. Eye exam scheduled. Foot exam up-to-date.  Urine microalbumin up-to-date. Continue current regimen.  Will alter based on results.  Hyperlipidemia On Caduet and 81 mg ASA.  Diet and exercise reviewed. Fasting lipids today.     Leeanne Rio, PA-C

## 2016-05-07 NOTE — Assessment & Plan Note (Signed)
Repeat labs today.  Diet and exercise reviewed. Eye exam scheduled. Foot exam up-to-date.  Urine microalbumin up-to-date. Continue current regimen.  Will alter based on results.

## 2016-05-07 NOTE — Patient Instructions (Signed)
Please go to the lab for blood work. Please get back to regular exercise regimen.  Watch the soft drinks and sweets. Increase vegetables. Continue checking sugar levels (fasting) each morning and recording.  We will get records of last eye examination and colonoscopy. Please sign a medical record release form up front.  We will alter your regimen based on results and schedule a follow-up.   Diabetes Mellitus and Exercise Exercising regularly is important for your overall health, especially when you have diabetes (diabetes mellitus). Exercising is not only about losing weight. It has many health benefits, such as increasing muscle strength and bone density and reducing body fat and stress. This leads to improved fitness, flexibility, and endurance, all of which result in better overall health. Exercise has additional benefits for people with diabetes, including:  Reducing appetite.  Helping to lower and control blood glucose.  Lowering blood pressure.  Helping to control amounts of fatty substances (lipids) in the blood, such as cholesterol and triglycerides.  Helping the body to respond better to insulin (improving insulin sensitivity).  Reducing how much insulin the body needs.  Decreasing the risk for heart disease by:  Lowering cholesterol and triglyceride levels.  Increasing the levels of good cholesterol.  Lowering blood glucose levels. What is my activity plan? Your health care provider or certified diabetes educator can help you make a plan for the type and frequency of exercise (activity plan) that works for you. Make sure that you:  Do at least 150 minutes of moderate-intensity or vigorous-intensity exercise each week. This could be brisk walking, biking, or water aerobics.  Do stretching and strength exercises, such as yoga or weightlifting, at least 2 times a week.  Spread out your activity over at least 3 days of the week.  Get some form of physical activity  every day.  Do not go more than 2 days in a row without some kind of physical activity.  Avoid being inactive for more than 90 minutes at a time. Take frequent breaks to walk or stretch.  Choose a type of exercise or activity that you enjoy, and set realistic goals.  Start slowly, and gradually increase the intensity of your exercise over time. What do I need to know about managing my diabetes?  Check your blood glucose before and after exercising.  If your blood glucose is higher than 240 mg/dL (13.3 mmol/L) before you exercise, check your urine for ketones. If you have ketones in your urine, do not exercise until your blood glucose returns to normal.  Know the symptoms of low blood glucose (hypoglycemia) and how to treat it. Your risk for hypoglycemia increases during and after exercise. Common symptoms of hypoglycemia can include:  Hunger.  Anxiety.  Sweating and feeling clammy.  Confusion.  Dizziness or feeling light-headed.  Increased heart rate or palpitations.  Blurry vision.  Tingling or numbness around the mouth, lips, or tongue.  Tremors or shakes.  Irritability.  Keep a rapid-acting carbohydrate snack available before, during, and after exercise to help prevent or treat hypoglycemia.  Avoid injecting insulin into areas of the body that are going to be exercised. For example, avoid injecting insulin into:  The arms, when playing tennis.  The legs, when jogging.  Keep records of your exercise habits. Doing this can help you and your health care provider adjust your diabetes management plan as needed. Write down:  Food that you eat before and after you exercise.  Blood glucose levels before and after you exercise.  The type and amount of exercise you have done.  When your insulin is expected to peak, if you use insulin. Avoid exercising at times when your insulin is peaking.  When you start a new exercise or activity, work with your health care provider  to make sure the activity is safe for you, and to adjust your insulin, medicines, or food intake as needed.  Drink plenty of water while you exercise to prevent dehydration or heat stroke. Drink enough fluid to keep your urine clear or pale yellow. This information is not intended to replace advice given to you by your health care provider. Make sure you discuss any questions you have with your health care provider. Document Released: 05/12/2003 Document Revised: 09/09/2015 Document Reviewed: 08/01/2015 Elsevier Interactive Patient Education  2017 [employer].

## 2016-05-07 NOTE — Assessment & Plan Note (Signed)
On Caduet and 81 mg ASA.  Diet and exercise reviewed. Fasting lipids today.

## 2016-05-07 NOTE — Assessment & Plan Note (Signed)
BP well controlled. Labs today.  Diet and exercise reviewed. Continue current regimen.

## 2016-05-07 NOTE — Progress Notes (Signed)
Pre visit review using our clinic review tool, if applicable. No additional management support is needed unless otherwise documented below in the visit note. 

## 2016-05-22 ENCOUNTER — Telehealth: Payer: Self-pay | Admitting: Physician Assistant

## 2016-05-22 LAB — HM DIABETES EYE EXAM

## 2016-05-22 NOTE — Telephone Encounter (Signed)
Patient requesting call back regarding questions related to eye exam and refill request for amLODipine-atorvastatin (CADUET) 5-10 MG tablet.

## 2016-05-23 ENCOUNTER — Other Ambulatory Visit: Payer: Self-pay | Admitting: Emergency Medicine

## 2016-05-23 MED ORDER — AMLODIPINE-ATORVASTATIN 5-10 MG PO TABS: 1.0000 | ORAL_TABLET | Freq: Every day | ORAL | 3 refills | Status: DC

## 2016-05-24 NOTE — Telephone Encounter (Signed)
Spoke with patient and he Edwin [name] that he had his diabetic eye exam completed on 05/22/16 at [facility] [phone]. Have placed the reorder for the Caduet 5/10 mg for patient. They will place on 05/28/16. Will notify patient when shipment comes in.

## 2016-06-15 ENCOUNTER — Telehealth: Payer: Self-pay | Admitting: Physician Assistant

## 2016-06-15 NOTE — Telephone Encounter (Signed)
Advised patient we do not prescribe abx over the phone without an appt. He is understandable.  His sxs has been 2-3 days, has cough with greenish sputum.  Has been using the Netty pot, Advil cold/sinus and flonase.  Advised to use the Mucinex-Dm bid and stay hydrate. If sxs has not improved over the weekend to schedule an appt on Monday. He is agreeable.

## 2016-06-15 NOTE — Telephone Encounter (Signed)
Do not call in antibiotics. He would need appointment. If symptoms only present for a couple of days, is likely viral or allergy related.  Make sure he is staying hydrated and taking Mucinex-DM twice daily to help with mucous and cough.  Follow-up if not improving over the weekend.

## 2016-06-15 NOTE — Telephone Encounter (Signed)
Pt states that his allergies are acting back up and that is is coughing green mucus, pt asking if abx can be called in for him, [facility] [address].

## 2016-06-29 ENCOUNTER — Ambulatory Visit: Payer: BLUE CROSS/BLUE SHIELD | Admitting: Physician Assistant

## 2016-07-09 ENCOUNTER — Other Ambulatory Visit: Payer: Self-pay | Admitting: Emergency Medicine

## 2016-07-09 ENCOUNTER — Telehealth: Payer: Self-pay | Admitting: Emergency Medicine

## 2016-07-09 MED ORDER — METFORMIN HCL 1000 MG PO TABS: 1000.0000 mg | ORAL_TABLET | Freq: Two times a day (BID) | ORAL | 1 refills | Status: DC

## 2016-07-09 MED ORDER — FLUTICASONE PROPIONATE 50 MCG/ACT NA SUSP: 2.0000 | Freq: Every day | NASAL | 6 refills | Status: DC | PRN

## 2016-07-09 NOTE — Telephone Encounter (Signed)
Patient calling for refills of the Flonase and Metformin to the [place]. Rx refilled to the pharmacy. Checking if need re-order of [name] thru patient assistance.  Patient checking on if we received records of last colonoscopy in [place]. Do you have any records or information about last colonoscopy for patient?

## 2016-07-09 NOTE — Telephone Encounter (Signed)
I had not received anything as of last Friday. Have not been in office since then so if anything has come in since it would be in my green folder. Please check this folder. Otherwise I have not received records.

## 2016-07-11 NOTE — Telephone Encounter (Signed)
Called to re-order of the Caduet thru [employer] patient assitance program Medication is not able for renewal until 07/28/16. Will call back to reorder the Caduet closer to time.

## 2016-07-25 ENCOUNTER — Ambulatory Visit: Payer: BLUE CROSS/BLUE SHIELD | Admitting: Physician Assistant

## 2016-07-31 NOTE — Telephone Encounter (Signed)
Caduet has been re-ordered thru Patient Assistance program on 07/31/16. Order number # [id number]. Allow 7-10 days for processing and shipping

## 2016-08-06 ENCOUNTER — Encounter: Payer: Self-pay | Admitting: Emergency Medicine

## 2016-08-06 ENCOUNTER — Telehealth: Payer: Self-pay | Admitting: Emergency Medicine
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
## 2016-08-30 ENCOUNTER — Telehealth: Payer: Self-pay | Admitting: *Deleted

## 2016-08-30 DIAGNOSIS — Z1211 Encounter for screening for malignant neoplasm of colon: Secondary | ICD-10-CM

## 2016-08-30 NOTE — Telephone Encounter (Signed)
Patient calling because he says he has not heard anything about his colonoscopy and he is wanting to know what he is supposed to do

## 2016-08-30 NOTE — Telephone Encounter (Signed)
We were attempting to get records of prior colonoscopy. Seems this has not been successful. Ok to place referral to GI for screening colonoscopy -- routine screening.

## 2016-08-31 NOTE — Addendum Note (Signed)
Addended by: Katina Dung on: 08/31/2016 10:21 AM   Modules accepted: Orders

## 2016-08-31 NOTE — Telephone Encounter (Signed)
Referral has been placed for GI  -  Left detailed message for patient to let him know someone would be calling him to schedule.

## 2016-09-03 ENCOUNTER — Encounter: Payer: Self-pay | Admitting: Gastroenterology

## 2016-09-25 ENCOUNTER — Other Ambulatory Visit: Payer: Self-pay | Admitting: Emergency Medicine

## 2016-09-25 ENCOUNTER — Telehealth: Payer: Self-pay | Admitting: Physician Assistant

## 2016-09-25 MED ORDER — METFORMIN HCL 1000 MG PO TABS: 1000.0000 mg | ORAL_TABLET | Freq: Two times a day (BID) | ORAL | 1 refills | Status: DC

## 2016-09-25 MED ORDER — FLUTICASONE PROPIONATE 50 MCG/ACT NA SUSP: 2.0000 | Freq: Every day | NASAL | 6 refills | Status: DC | PRN

## 2016-09-25 NOTE — Telephone Encounter (Signed)
Pt needs refills on caduet, metformin, and glimepiride, sent to [facility] on [address]

## 2016-09-25 NOTE — Telephone Encounter (Signed)
Spoke with patient. He states he has been taking the Glimepiride as needed if sugars are elevated. Taking Metformin 1/2 tab twice daily.  Per PCP that he can continue the Metformin and not to take the Glimepiride. Rx sent to the pharmacy. Working on refill of the caudet.

## 2016-09-25 NOTE — Telephone Encounter (Signed)
LMOVM advising patient to call back to verify his medications. We do not have him taking Glimepiride. He should only be taking Metformin. He receives his [name] thru patient assistance. Does he need a re-authorization or wants a rx sent to the local pharmacy

## 2016-09-25 NOTE — Telephone Encounter (Signed)
Ok to send in refills of Metformin and Glimepiride. Caduet comes from mail order. He is due for a follow-up in late August/early September -- DM

## 2016-09-27 NOTE — Telephone Encounter (Signed)
[employer] for reorder of Caduet 5/10. Unable to reorder until after 09/29/16. Will call back to reorder on Monday.

## 2016-09-27 NOTE — Telephone Encounter (Signed)
Noted! Thank you

## 2016-10-01 NOTE — Telephone Encounter (Addendum)
Advised patient he did not need to continue the Glimepiride due to taking the Metformin, Will check levels at next OV to make sure the medication is working to control levels. He is agreeable. [facility] did not have the Metformin ready.. Will call the pharmacy to verify any hold up with the medication. [facility] states it was too soon to fill the Metformin. He wasn't due until 10/20/16. He will check his bottle when he gets home.  Called patient but phone was disconnected. He does not need to take the Glimepiride anymore. I have reordered the [place] thru patient assistance.

## 2016-10-01 NOTE — Telephone Encounter (Signed)
Patient requesting return call from [place].  He states went to pharmacy to pick up medication but it was not there.  Please call him back at [phone].

## 2016-10-02 ENCOUNTER — Telehealth: Payer: Self-pay | Admitting: Physician Assistant

## 2016-10-02 NOTE — Telephone Encounter (Signed)
Pt calling in regarding his no show fee he received on 07/25/16. Pt states that he spoke to Patina regarding a since infection and states that appt was to be cancelled, Please advise

## 2016-10-12 ENCOUNTER — Other Ambulatory Visit: Payer: Self-pay | Admitting: Emergency Medicine

## 2016-10-12 MED ORDER — AMLODIPINE-ATORVASTATIN 5-10 MG PO TABS: 1.0000 | ORAL_TABLET | Freq: Every day | ORAL | 3 refills | Status: DC

## 2016-10-12 NOTE — Progress Notes (Signed)
Advised patient his samples of [name] is ready for pick up at the front desk.

## 2016-11-07 ENCOUNTER — Ambulatory Visit (AMBULATORY_SURGERY_CENTER): Payer: Self-pay | Admitting: *Deleted

## 2016-11-07 VITALS — Ht 71.0 in | Wt 296.0 lb

## 2016-11-07 DIAGNOSIS — Z1211 Encounter for screening for malignant neoplasm of colon: Secondary | ICD-10-CM

## 2016-11-07 MED ORDER — NA SULFATE-K SULFATE-MG SULF 17.5-3.13-1.6 GM/177ML PO SOLN: ORAL | 0 refills | Status: DC

## 2016-11-07 NOTE — Progress Notes (Signed)
Patient denies any allergies to eggs or soy. Patient denies any problems with anesthesia/sedation. Patient denies any oxygen use at home and does not take any diet/weight loss medications. EMMI education assisgned to patient on colonoscopy, this was explained and instructions given to patient. 

## 2016-11-16 ENCOUNTER — Encounter: Payer: Self-pay | Admitting: Gastroenterology

## 2016-11-19 ENCOUNTER — Telehealth: Payer: Self-pay | Admitting: *Deleted

## 2016-11-19 MED ORDER — FLUTICASONE PROPIONATE 50 MCG/ACT NA SUSP: 2.0000 | Freq: Every day | NASAL | 6 refills | Status: DC | PRN

## 2016-11-19 NOTE — Addendum Note (Signed)
Addended by: Katina Dung on: 11/19/2016 04:02 PM   Modules accepted: Orders

## 2016-11-19 NOTE — Telephone Encounter (Signed)
Patient states that he had to go back to 2 pills a day of Metformin because his sugar has been running high.  This morning it was 180 so he wanted to let us know he is back to 2 pills a day.   Patient is also requesting a refill on [facility] sent to [facility] on [address].

## 2016-11-19 NOTE — Telephone Encounter (Signed)
He is due for a follow-up for Diabetes and repeat A1C. Please encourage him to schedule. He needs to take 1000 mg BID every day. Ok to refill Flonase.

## 2016-11-19 NOTE — Telephone Encounter (Signed)
Patient aware that he is due for f/u before November.  That appointment has been rescheduledto next week.  He is aware to take 1000mg  metformin BID.     Patient aware that I am sending RX for flonase to pharmacy.

## 2016-11-19 NOTE — Telephone Encounter (Signed)
Left message for patient to call to discuss.  

## 2016-11-21 ENCOUNTER — Ambulatory Visit (AMBULATORY_SURGERY_CENTER): Payer: BLUE CROSS/BLUE SHIELD | Admitting: Gastroenterology

## 2016-11-21 ENCOUNTER — Encounter: Payer: Self-pay | Admitting: Gastroenterology

## 2016-11-21 VITALS — BP 113/71 | HR 73 | Temp 98.4°F | Resp 12 | Ht 71.0 in | Wt 296.0 lb

## 2016-11-21 DIAGNOSIS — Z1211 Encounter for screening for malignant neoplasm of colon: Secondary | ICD-10-CM

## 2016-11-21 DIAGNOSIS — Z1212 Encounter for screening for malignant neoplasm of rectum: Secondary | ICD-10-CM | POA: Diagnosis not present

## 2016-11-21 MED ORDER — SODIUM CHLORIDE 0.9 % IV SOLN: 500.0000 mL | INTRAVENOUS | Status: DC

## 2016-11-21 NOTE — Patient Instructions (Signed)
YOU HAD AN ENDOSCOPIC PROCEDURE TODAY AT [facility]:   Refer to the procedure report that was given to you for any specific questions about what was found during the examination.  If the procedure report does not answer your questions, please call your gastroenterologist to clarify.  If you requested that your care partner not be given the details of your procedure findings, then the procedure report has been included in a sealed envelope for you to review at your convenience later.  YOU SHOULD EXPECT: Some feelings of bloating in the abdomen. Passage of more gas than usual.  Walking can help get rid of the air that was put into your GI tract during the procedure and reduce the bloating. If you had a lower endoscopy (such as a colonoscopy or flexible sigmoidoscopy) you may notice spotting of blood in your stool or on the toilet paper. If you underwent a bowel prep for your procedure, you may not have a normal bowel movement for a few days.  Please Note:  You might notice some irritation and congestion in your nose or some drainage.  This is from the oxygen used during your procedure.  There is no need for concern and it should clear up in a day or so.  SYMPTOMS TO REPORT IMMEDIATELY:   Following lower endoscopy (colonoscopy or flexible sigmoidoscopy):  Excessive amounts of blood in the stool  Significant tenderness or worsening of abdominal pains  Swelling of the abdomen that is new, acute  Fever of 100F or higher    For urgent or emergent issues, a gastroenterologist can be reached at any hour by calling [phone].   DIET:  We do recommend a small meal at first, but then you may proceed to your regular diet.  Drink plenty of fluids but you should avoid alcoholic beverages for 24 hours.  ACTIVITY:  You should plan to take it easy for the rest of today and you should NOT DRIVE or use heavy machinery until tomorrow (because of the sedation medicines used during the test).     FOLLOW UP: Our staff will call the number listed on your records the next business day following your procedure to check on you and address any questions or concerns that you may have regarding the information given to you following your procedure. If we do not reach you, we will leave a message.  However, if you are feeling well and you are not experiencing any problems, there is no need to return our call.  We will assume that you have returned to your regular daily activities without incident.  If any biopsies were taken you will be contacted by phone or by letter within the next 1-3 weeks.  Please call us at [phone] if you have not heard about the biopsies in 3 weeks.    SIGNATURES/CONFIDENTIALITY: You and/or your care partner have signed paperwork which will be entered into your electronic medical record.  These signatures attest to the fact that that the information above on your After Visit Summary has been reviewed and is understood.  Full responsibility of the confidentiality of this discharge information lies with you and/or your care-partner.   Resume medications. Repeat procedure in 10 years.
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
## 2016-11-21 NOTE — Progress Notes (Signed)
A and O x3. Report to RN. Tolerated MAC anesthesia well.

## 2016-11-21 NOTE — Op Note (Signed)
[facility] Patient Name: Edwin [name] Procedure Date: 11/21/2016 8:41 AM MRN: [id number] Endoscopist: Mallie Mussel L. Loletha Carrow , MD Age: 51 Referring MD:  Date of Birth: 1965/06/08 Gender: Male Account #: [id number] Procedure:                Colonoscopy Indications:              Screening for colorectal malignant neoplasm, This                            is the patient's first screening colonoscopy Medicines:                Monitored Anesthesia Care Procedure:                Pre-Anesthesia Assessment:                           - Prior to the procedure, a History and Physical                            was performed, and patient medications and                            allergies were reviewed. The patient's tolerance of                            previous anesthesia was also reviewed. The risks                            and benefits of the procedure and the sedation                            options and risks were discussed with the patient.                            All questions were answered, and informed consent                            was obtained. Prior Anticoagulants: The patient has                            taken no previous anticoagulant or antiplatelet                            agents. ASA Grade Assessment: II - A patient with                            mild systemic disease. After reviewing the risks                            and benefits, the patient was deemed in                            satisfactory condition to undergo the procedure.  After obtaining informed consent, the colonoscope                            was passed under direct vision. Throughout the                            procedure, the patient's blood pressure, pulse, and                            oxygen saturations were monitored continuously. The                            Colonoscope was introduced through the anus and                            advanced to the  the cecum, identified by                            appendiceal orifice and ileocecal valve. The                            colonoscopy was performed without difficulty. The                            patient tolerated the procedure well. The quality                            of the bowel preparation was excellent. The                            ileocecal valve, appendiceal orifice, and rectum                            were photographed. The quality of the bowel                            preparation was evaluated using the BBPS [facility]                            Bowel Preparation Scale) with scores of: Right                            Colon = 3, Transverse Colon = 3 and Left Colon = 3                            (entire mucosa seen well with no residual staining,                            small fragments of stool or opaque liquid). The                            total BBPS score equals 9. The bowel preparation  used was SUPREP. Scope In: 8:65:78 AM Scope Out: 9:06:24 AM Scope Withdrawal Time: 0 hours 12 minutes 24 seconds  Total Procedure Duration: 0 hours 17 minutes 6 seconds  Findings:                 The perianal and digital rectal examinations were                            normal.                           The entire examined colon appeared normal on direct                            and retroflexion views. Complications:            No immediate complications. Estimated Blood Loss:     Estimated blood loss: none. Impression:               - The entire examined colon is normal on direct and                            retroflexion views.                           - No specimens collected. Recommendation:           - Patient has a contact number available for                            emergencies. The signs and symptoms of potential                            delayed complications were discussed with the                            patient. Return to  normal activities tomorrow.                            Written discharge instructions were provided to the                            patient.                           - Resume previous diet.                           - Continue present medications.                           - Repeat colonoscopy in 10 years for screening                            purposes. Brittany Osier L. Loletha Carrow, MD 11/21/2016 9:09:01 AM This report has been signed electronically.

## 2016-11-22 ENCOUNTER — Telehealth: Payer: Self-pay

## 2016-11-22 NOTE — Telephone Encounter (Signed)
  Follow up Call-  Call back number 11/21/2016  Post procedure Call Back phone  # [phone]  Permission to leave phone message Yes  Some recent data might be hidden     Patient questions:  Do you have a fever, pain , or abdominal swelling? No. Pain Score  0 *  Have you tolerated food without any problems? Yes.    Have you been able to return to your normal activities? Yes.    Do you have any questions about your discharge instructions: Diet   No. Medications  No. Follow up visit  No.  Do you have questions or concerns about your Care? No.  Actions: * If pain score is 4 or above: No action needed, pain <4.

## 2016-11-26 ENCOUNTER — Encounter: Payer: Self-pay | Admitting: Physician Assistant

## 2016-11-26 ENCOUNTER — Ambulatory Visit (INDEPENDENT_AMBULATORY_CARE_PROVIDER_SITE_OTHER): Payer: BLUE CROSS/BLUE SHIELD | Admitting: Physician Assistant

## 2016-11-26 VITALS — BP 134/78 | HR 75 | Temp 98.6°F | Resp 14 | Ht 71.0 in | Wt 290.0 lb

## 2016-11-26 DIAGNOSIS — I10 Essential (primary) hypertension: Secondary | ICD-10-CM | POA: Diagnosis not present

## 2016-11-26 DIAGNOSIS — E785 Hyperlipidemia, unspecified: Secondary | ICD-10-CM

## 2016-11-26 DIAGNOSIS — E119 Type 2 diabetes mellitus without complications: Secondary | ICD-10-CM | POA: Diagnosis not present

## 2016-11-26 LAB — MICROALBUMIN / CREATININE URINE RATIO
Creatinine,U: 49.2 mg/dL
Microalb Creat Ratio: 1.4 mg/g (ref 0.0–30.0)

## 2016-11-26 LAB — COMPREHENSIVE METABOLIC PANEL
ALT: 24 U/L (ref 0–53)
AST: 16 U/L (ref 0–37)
Albumin: 4.5 g/dL (ref 3.5–5.2)
Alkaline Phosphatase: 73 U/L (ref 39–117)
BUN: 15 mg/dL (ref 6–23)
CO2: 31 mEq/L (ref 19–32)
Calcium: 10.1 mg/dL (ref 8.4–10.5)
Chloride: 103 mEq/L (ref 96–112)
Creatinine, Ser: 1.02 mg/dL (ref 0.40–1.50)
GFR: 98.97 mL/min (ref >60.00)
GLUCOSE: 125 mg/dL — AB (ref 70–99)
POTASSIUM: 4.9 meq/L (ref 3.5–5.1)
SODIUM: 141 meq/L (ref 135–145)
Total Bilirubin: 0.6 mg/dL (ref 0.2–1.2)
Total Protein: 7.5 g/dL (ref 6.0–8.3)

## 2016-11-26 LAB — LIPID PANEL
CHOL/HDL RATIO: 4
Cholesterol: 164 mg/dL (ref 0–200)
HDL: 41.3 mg/dL (ref >39.00)
LDL CALC: 107 mg/dL — AB (ref 0–99)
NONHDL: 123.14
Triglycerides: 81 mg/dL (ref 0.0–149.0)
VLDL: 16.2 mg/dL (ref 0.0–40.0)

## 2016-11-26 LAB — HEMOGLOBIN A1C: Hgb A1c MFr Bld: 6.8 % — ABNORMAL HIGH (ref 4.6–6.5)

## 2016-11-26 MED ORDER — IBUPROFEN 600 MG PO TABS: 600.0000 mg | ORAL_TABLET | Freq: Three times a day (TID) | ORAL | 0 refills | Status: DC | PRN

## 2016-11-26 NOTE — Assessment & Plan Note (Signed)
Sugars improving. Repeat labs today. Foot exam up-to-date. Eye exam up-to-date. Declines flu shot and pneumonia vaccination.
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
## 2016-11-26 NOTE — Patient Instructions (Signed)
Please go to the lab for blood work. I will call you with your results. Please continue medications as directed. Follow-up with me in 6 months for a complete physical.  Diabetes Mellitus and Food It is important for you to manage your blood sugar (glucose) level. Your blood glucose level can be greatly affected by what you eat. Eating healthier foods in the appropriate amounts throughout the day at about the same time each day will help you control your blood glucose level. It can also help slow or prevent worsening of your diabetes mellitus. Healthy eating may even help you improve the level of your blood pressure and reach or maintain a healthy weight. General recommendations for healthful eating and cooking habits include:  Eating meals and snacks regularly. Avoid going long periods of time without eating to lose weight.  Eating a diet that consists mainly of plant-based foods, such as fruits, vegetables, nuts, legumes, and whole grains.  Using low-heat cooking methods, such as baking, instead of high-heat cooking methods, such as deep frying.  Work with your dietitian to make sure you understand how to use the Nutrition Facts information on food labels. How can food affect me? Carbohydrates Carbohydrates affect your blood glucose level more than any other type of food. Your dietitian will help you determine how many carbohydrates to eat at each meal and teach you how to count carbohydrates. Counting carbohydrates is important to keep your blood glucose at a healthy level, especially if you are using insulin or taking certain medicines for diabetes mellitus. Alcohol Alcohol can cause sudden decreases in blood glucose (hypoglycemia), especially if you use insulin or take certain medicines for diabetes mellitus. Hypoglycemia can be a life-threatening condition. Symptoms of hypoglycemia (sleepiness, dizziness, and disorientation) are similar to symptoms of having too much alcohol. If your health  care provider has given you approval to drink alcohol, do so in moderation and use the following guidelines:  Women should not have more than one drink per day, and men should not have more than two drinks per day. One drink is equal to: ? 12 oz of beer. ? 5 oz of wine. ? 1 oz of hard liquor.  Do not drink on an empty stomach.  Keep yourself hydrated. Have water, diet soda, or unsweetened iced tea.  Regular soda, juice, and other mixers might contain a lot of carbohydrates and should be counted.  What foods are not recommended? As you make food choices, it is important to remember that all foods are not the same. Some foods have fewer nutrients per serving than other foods, even though they might have the same number of calories or carbohydrates. It is difficult to get your body what it needs when you eat foods with fewer nutrients. Examples of foods that you should avoid that are high in calories and carbohydrates but low in nutrients include:  Trans fats (most processed foods list trans fats on the Nutrition Facts label).  Regular soda.  Juice.  Candy.  Sweets, such as cake, pie, doughnuts, and cookies.  Fried foods.  What foods can I eat? Eat nutrient-rich foods, which will nourish your body and keep you healthy. The food you should eat also will depend on several factors, including:  The calories you need.  The medicines you take.  Your weight.  Your blood glucose level.  Your blood pressure level.  Your cholesterol level.  You should eat a variety of foods, including:  Protein. ? Lean cuts of meat. ? Proteins low  in saturated fats, such as fish, egg whites, and beans. Avoid processed meats.  Fruits and vegetables. ? Fruits and vegetables that may help control blood glucose levels, such as apples, mangoes, and yams.  Dairy products. ? Choose fat-free or low-fat dairy products, such as milk, yogurt, and cheese.  Grains, bread, pasta, and rice. ? Choose  whole grain products, such as multigrain bread, whole oats, and brown rice. These foods may help control blood pressure.  Fats. ? Foods containing healthful fats, such as nuts, avocado, olive oil, canola oil, and fish.  Does everyone with diabetes mellitus have the same meal plan? Because every person with diabetes mellitus is different, there is not one meal plan that works for everyone. It is very important that you meet with a dietitian who will help you create a meal plan that is just right for you. This information is not intended to replace advice given to you by your health care provider. Make sure you discuss any questions you have with your health care provider. Document Released: 11/16/2004 Document Revised: 07/28/2015 Document Reviewed: 01/16/2013 Elsevier Interactive Patient Education  2017 [employer].

## 2016-11-26 NOTE — Progress Notes (Signed)
Pre visit review using our clinic review tool, if applicable. No additional management support is needed unless otherwise documented below in the visit note. 

## 2016-11-26 NOTE — Addendum Note (Signed)
Addended by: Brunetta Jeans on: 11/26/2016 05:01 PM   Modules accepted: Orders

## 2016-11-26 NOTE — Assessment & Plan Note (Signed)
BP normotensive on recheck. Continue current regimen. Dietary and exercise recommendations reviewed. Repeat labs today.

## 2016-11-26 NOTE — Assessment & Plan Note (Signed)
Taking medications as directed. Repeat labs today.

## 2016-11-27 ENCOUNTER — Telehealth: Payer: Self-pay | Admitting: Physician Assistant

## 2016-11-27 MED ORDER — SILDENAFIL CITRATE 50 MG PO TABS: 25.0000 mg | ORAL_TABLET | Freq: Every day | ORAL | 0 refills | Status: DC | PRN

## 2016-11-27 MED ORDER — BAYER MICROLET LANCETS MISC: 12 refills | Status: DC

## 2016-11-27 NOTE — Telephone Encounter (Signed)
Pt is calling asking for the other Rx that needed to be called in for testosterone and bayer lancets , [facility] [address]

## 2016-11-27 NOTE — Telephone Encounter (Signed)
Done

## 2016-11-28 ENCOUNTER — Other Ambulatory Visit: Payer: Self-pay | Admitting: Physician Assistant

## 2016-11-28 DIAGNOSIS — E785 Hyperlipidemia, unspecified: Secondary | ICD-10-CM

## 2016-11-28 DIAGNOSIS — E119 Type 2 diabetes mellitus without complications: Secondary | ICD-10-CM

## 2016-12-03 ENCOUNTER — Ambulatory Visit: Payer: BLUE CROSS/BLUE SHIELD | Admitting: Physician Assistant

## 2016-12-10 ENCOUNTER — Telehealth: Payer: Self-pay | Admitting: *Deleted

## 2016-12-10 DIAGNOSIS — E119 Type 2 diabetes mellitus without complications: Secondary | ICD-10-CM

## 2016-12-10 MED ORDER — ONETOUCH VERIO FLEX SYSTEM W/DEVICE KIT: 1.0000 [IU] | PACK | Freq: Every day | 0 refills | Status: DC

## 2016-12-10 MED ORDER — GLUCOSE BLOOD VI STRP: ORAL_STRIP | 12 refills | Status: DC

## 2016-12-10 NOTE — Addendum Note (Signed)
Addended by: Leonidas Romberg on: 12/10/2016 01:50 PM   Modules accepted: Orders

## 2016-12-10 NOTE — Telephone Encounter (Signed)
Patient is calling stating that he is needing a new glucose monitoring system.  He has a contour and relion and the pharmacy states they cannot get strips for either of those machines. He is asking if a whole new machine and strips could be called in.

## 2016-12-10 NOTE — Telephone Encounter (Signed)
Advised patient we sent in new rx for One Touch Verior meter and strips. To let us know if there is problems with insurance.

## 2016-12-31 ENCOUNTER — Telehealth: Payer: Self-pay | Admitting: Physician Assistant

## 2016-12-31 DIAGNOSIS — E119 Type 2 diabetes mellitus without complications: Secondary | ICD-10-CM

## 2016-12-31 NOTE — Telephone Encounter (Signed)
Patient states the wrong test strips and lancets were sent to pharmacy.  These supplies do not work for the glucometer he has.  He states he called last week to notify office but never heard back from anyone.  He is requesting a call back from office asap.

## 2016-12-31 NOTE — Telephone Encounter (Signed)
Patient calling back to speak with Patina, advised him that Patina was with a patient.  He states he needs a call back asap.

## 2017-01-02 ENCOUNTER — Telehealth: Payer: Self-pay | Admitting: Physician Assistant

## 2017-01-02 MED ORDER — ONETOUCH DELICA LANCETS 33G MISC: 1.0000 | Freq: Every day | 3 refills | Status: DC

## 2017-01-02 MED ORDER — GLUCOSE BLOOD VI STRP: ORAL_STRIP | 3 refills | Status: DC

## 2017-01-02 NOTE — Telephone Encounter (Signed)
Spoke with patient yesterday about diabetic supplies. He advised that we sent in the wrong strips and lancets for patient.  Verified which Meter was sent in One Touch Verio. Refilled One touch verio strips and Lancets (One Touch Delica) Sent both rx to the [facility]

## 2017-01-02 NOTE — Telephone Encounter (Signed)
Pt states that he has the one touch verio meter that uses contour lancets and contour test strips.

## 2017-01-03 NOTE — Telephone Encounter (Signed)
Rx sent to the pharmacy for One Touch Verio strips and Delica lancets. These work for the new meters.

## 2017-01-08 ENCOUNTER — Telehealth: Payer: Self-pay | Admitting: Physician Assistant

## 2017-01-08 NOTE — Telephone Encounter (Signed)
Pt states that he needs a refill on flonase and caduet, [facility] [address].

## 2017-01-09 ENCOUNTER — Other Ambulatory Visit: Payer: Self-pay | Admitting: Emergency Medicine

## 2017-01-09 MED ORDER — FLUTICASONE PROPIONATE 50 MCG/ACT NA SUSP: 2.0000 | Freq: Every day | NASAL | 6 refills | Status: DC | PRN

## 2017-01-09 MED ORDER — AMLODIPINE-ATORVASTATIN 5-10 MG PO TABS: 1.0000 | ORAL_TABLET | Freq: Every day | ORAL | 0 refills | Status: DC

## 2017-01-09 NOTE — Progress Notes (Signed)
Reorder of [name] to Patient assistance program with [place] on 01/09/17.

## 2017-01-09 NOTE — Telephone Encounter (Signed)
Flonase rx sent to the [facility] for patient Sent in 1 month supply of [name] to the patient pharmacy Also refill [name] thru Patient assistance program.

## 2017-01-14 ENCOUNTER — Ambulatory Visit: Payer: BLUE CROSS/BLUE SHIELD | Admitting: Physician Assistant

## 2017-01-14 ENCOUNTER — Telehealth: Payer: Self-pay | Admitting: Physician Assistant

## 2017-01-14 NOTE — Telephone Encounter (Signed)
Pt states that Rx for caduet should have went to a mail order pharmacy and not [facility], pt states that Patina has the information on mail order

## 2017-01-15 NOTE — Telephone Encounter (Signed)
[name] was refilled thru Patient assistance program with [employer]. Sent in a limited supply of medication if patient was out of medication. Waiting on samples to come in the mail to the office

## 2017-01-21 ENCOUNTER — Ambulatory Visit: Payer: BLUE CROSS/BLUE SHIELD | Admitting: Physician Assistant

## 2017-01-21 ENCOUNTER — Other Ambulatory Visit (HOSPITAL_COMMUNITY)
Admission: RE | Admit: 2017-01-21 | Discharge: 2017-01-21 | Disposition: A | Discharge status: Home / Self Care | Payer: BLUE CROSS/BLUE SHIELD | Source: Ambulatory Visit | Attending: Physician Assistant | Admitting: Physician Assistant

## 2017-01-21 ENCOUNTER — Other Ambulatory Visit: Payer: Self-pay

## 2017-01-21 ENCOUNTER — Ambulatory Visit (INDEPENDENT_AMBULATORY_CARE_PROVIDER_SITE_OTHER): Payer: BLUE CROSS/BLUE SHIELD | Admitting: Physician Assistant

## 2017-01-21 ENCOUNTER — Encounter: Payer: Self-pay | Admitting: Physician Assistant

## 2017-01-21 VITALS — BP 130/78 | HR 86 | Temp 97.8°F | Resp 14 | Ht 71.0 in | Wt 294.0 lb

## 2017-01-21 DIAGNOSIS — Z202 Contact with and (suspected) exposure to infections with a predominantly sexual mode of transmission: Secondary | ICD-10-CM | POA: Diagnosis not present

## 2017-01-21 DIAGNOSIS — D235 Other benign neoplasm of skin of trunk: Secondary | ICD-10-CM

## 2017-01-21 NOTE — Progress Notes (Signed)
Patient presents to clinic today to discuss recent testing results his fiance received. 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 Patient states fiance denies prior known history of this. States he has no known history of this as well and has never had symptoms. Would like STI panel today.   Past Medical History:  Diagnosis Date  . Allergy   . DM (diabetes mellitus) ([place])    type 2  . High cholesterol   . Hypertension   . Low testosterone     Current Outpatient Medications on File Prior to Visit  Medication Sig Dispense Refill  . amLODipine-atorvastatin (CADUET) 5-10 MG tablet Take 1 tablet daily by mouth. 30 tablet 0  . aspirin EC 81 MG tablet Take 1 tablet by mouth daily.    . Blood Glucose Monitoring Suppl (ONETOUCH VERIO FLEX SYSTEM) w/Device KIT 1 Units by Does not apply route daily. Dx:E11.9 1 kit 0  . fluticasone (FLONASE) 50 MCG/ACT nasal spray Place 2 sprays daily as needed into both nostrils for allergies or rhinitis. 16 g 6  . glucose blood test strip Use as instructed daily Dx: E11.9 100 each 3  . ibuprofen (ADVIL,MOTRIN) 600 MG tablet Take 1 tablet (600 mg total) by mouth every 8 (eight) hours as needed. 90 tablet 0  . metFORMIN (GLUCOPHAGE) 1000 MG tablet Take 1 tablet (1,000 mg total) by mouth 2 (two) times daily with a meal. 180 tablet 1  . multivitamin (ONE-A-DAY MEN'S) TABS tablet Take 1 tablet by mouth daily.    Glory Rosebush DELICA LANCETS 30Q MISC 1 Stick by Does not apply route daily. Dx:E11.9 100 each 3  . sildenafil (VIAGRA) 50 MG tablet Take 0.5-1 tablets (25-50 mg total) by mouth daily as needed for erectile dysfunction. 10 tablet 0   No current facility-administered medications on file prior to visit.     Allergies  Allergen Reactions  . Avelox [Moxifloxacin Hcl In Nacl] Hives and Itching    Family History  Problem Relation Age of Onset  . Aneurysm Mother 87       Deceased  . Healthy Father        Living  .  Prostate cancer Paternal Uncle   . Healthy Daughter        x3  . Colon cancer Neg Hx   . Esophageal cancer Neg Hx   . Rectal cancer Neg Hx   . Stomach cancer Neg Hx     Social History   Socioeconomic History  . Marital status: Single    Spouse name: None  . Number of children: None  . Years of education: None  . Highest education level: None  Social Needs  . Financial resource strain: None  . Food insecurity - worry: None  . Food insecurity - inability: None  . Transportation needs - medical: None  . Transportation needs - non-medical: None  Occupational History  . None  Tobacco Use  . Smoking status: Former Smoker    Packs/day: 0.50    Years: 15.00    Pack years: 7.50    Last attempt to quit: 04/17/2005    Years since quitting: 11.7  . Smokeless tobacco: Never Used  Substance and Sexual Activity  . Alcohol use: No  . Drug use: No  . Sexual activity: None  Other Topics Concern  . None  Social History Narrative  . None    Review of Systems - See HPI.  All other ROS are negative.  There were no vitals taken for this visit.  Physical Exam  Constitutional: He is well-developed, well-nourished, and in no distress.  HENT:  Head: Normocephalic and atraumatic.  Eyes: Conjunctivae are normal.  Neck: Neck supple.  Cardiovascular: Normal rate, regular rhythm, normal heart sounds and intact distal pulses.  Pulmonary/Chest: Effort normal.  Genitourinary: Testes/scrotum normal and penis normal. No discharge found.  Genitourinary Comments: There is a 3 cm verrucous lesion in the left inguinal folds, concerning for condyloma versus plainviral verruca.  Skin: Skin is warm.  Psychiatric: Affect normal.  Vitals reviewed.   Recent Results (from the past 2160 hour(s))  Comp Met (CMET)     Status: Abnormal   Collection Time: 11/26/16 11:55 AM  Result Value Ref Range   Sodium 141 135 - 145 mEq/L   Potassium 4.9 3.5 - 5.1 mEq/L   Chloride 103 96 - 112 mEq/L   CO2 31 19 -  32 mEq/L   Glucose, Bld 125 (H) 70 - 99 mg/dL   BUN 15 6 - 23 mg/dL   Creatinine, Ser 1.02 0.40 - 1.50 mg/dL   Total Bilirubin 0.6 0.2 - 1.2 mg/dL   Alkaline Phosphatase 73 39 - 117 U/L   AST 16 0 - 37 U/L   ALT 24 0 - 53 U/L   Total Protein 7.5 6.0 - 8.3 g/dL   Albumin 4.5 3.5 - 5.2 g/dL   Calcium 10.1 8.4 - 10.5 mg/dL   GFR 98.97 >60.00 mL/min  Lipid panel     Status: Abnormal   Collection Time: 11/26/16 11:55 AM  Result Value Ref Range   Cholesterol 164 0 - 200 mg/dL    Comment: ATP III Classification       Desirable:  < 200 mg/dL               Borderline High:  200 - 239 mg/dL          High:  > = 240 mg/dL   Triglycerides 81.0 0.0 - 149.0 mg/dL    Comment: Normal:  <150 mg/dLBorderline High:  150 - 199 mg/dL   HDL 41.30 >39.00 mg/dL   VLDL 16.2 0.0 - 40.0 mg/dL   LDL Cholesterol 107 (H) 0 - 99 mg/dL   Total CHOL/HDL Ratio 4     Comment:                Men          Women1/2 Average Risk     3.4          3.3Average Risk          5.0          4.42X Average Risk          9.6          7.13X Average Risk          15.0          11.0                       NonHDL 123.14     Comment: NOTE:  Non-HDL goal should be 30 mg/dL higher than patient's LDL goal (i.e. LDL goal of < 70 mg/dL, would have non-HDL goal of < 100 mg/dL)  Urine Microalbumin w/creat. ratio     Status: None   Collection Time: 11/26/16 11:55 AM  Result Value Ref Range   Microalb, Ur <0.7 0.0 - 1.9 mg/dL   Creatinine,U 49.2 mg/dL   Microalb Creat Ratio  1.4 0.0 - 30.0 mg/g  Hemoglobin A1c     Status: Abnormal   Collection Time: 11/26/16 11:55 AM  Result Value Ref Range   Hgb A1c MFr Bld 6.8 (H) 4.6 - 6.5 %    Comment: Glycemic Control Guidelines for People with Diabetes:Non Diabetic:  <6%Goal of Therapy: <7%Additional Action Suggested:  >8%     Assessment/Plan: 1. STD exposure Partner with + HSV 2 serology. Patient without prior known history. Discussed transmission and typical course of acute/chronic HSV 2 infection.  Discussed healthy diet and immune boosting supplements. Exercise important factor. Safe sex practices reviewed. Will check panel today. Concern for condyloma of L inguinal region. Referral to Dermatology placed. - HIV antibody - Acute Hep Panel & Hep B Surface Ab - RPR - HSV(herpes smplx)abs-1+2(IgG+IgM)-bld - Urine cytology ancillary only   Leeanne Rio, PA-C

## 2017-01-21 NOTE — Progress Notes (Signed)
Pre visit review using our clinic review tool, if applicable. No additional management support is needed unless otherwise documented below in the visit note. 

## 2017-01-21 NOTE — Patient Instructions (Signed)
Please go to the lab for further testing. I will call you with results and next steps. I am setting you up with Dermatology for further assessment of the skin lesion in the groin.

## 2017-01-22 LAB — ACUTE HEP PANEL AND HEP B SURFACE AB
HEPATITIS C ANTIBODY REFILL$(REFL): NONREACTIVE
Hep A IgM: NONREACTIVE
Hep B C IgM: NONREACTIVE
Hepatitis B Surface Ag: NONREACTIVE
SIGNAL TO CUT-OFF: 0.15 (ref <1.00)

## 2017-01-22 LAB — RPR: RPR: NONREACTIVE

## 2017-01-22 LAB — REFLEX TIQ

## 2017-01-22 LAB — HIV ANTIBODY (ROUTINE TESTING W REFLEX): HIV 1&2 Ab, 4th Generation: NONREACTIVE

## 2017-01-23 LAB — URINE CYTOLOGY ANCILLARY ONLY
Chlamydia: NEGATIVE
Neisseria Gonorrhea: NEGATIVE

## 2017-01-23 LAB — HSV(HERPES SMPLX)ABS-I+II(IGG+IGM)-BLD
HSV 1 GLYCOPROTEIN G AB, IGG: 41.8 {index} — AB (ref 0.00–0.90)
HSV 2 IGG, TYPE SPEC: 8.18 {index} — AB (ref 0.00–0.90)
HSVI/II Comb IgM: <0.91 Ratio (ref 0.00–0.90)

## 2017-01-23 NOTE — Telephone Encounter (Signed)
[name] medication arrived today to the office. Will notify patient of samples available.

## 2017-02-05 ENCOUNTER — Other Ambulatory Visit: Payer: Self-pay | Admitting: Physician Assistant

## 2017-02-05 NOTE — Telephone Encounter (Signed)
Copied from [place] [phone]. Topic: Quick Communication - See Telephone Encounter >> Feb 05, 2017  2:21 PM Vernona Rieger wrote: CRM for notification. See Telephone encounter for:  Pt wants to know if the CADUET has come in. Michela Pitcher it has been 2 weeks. Please advise. [phone] Refill on Flonase sent to [facility] on [address]. Also he said that Dr Hassell Done was going to give him a cream for when his skin is dry. He said he cant remember the name but would like that called in also.  02/05/17.

## 2017-02-06 NOTE — Telephone Encounter (Signed)
I cannot recall this discussion. I know we discussed Dermatology to assess the skin lesion in the groin.  Can we have him remind me of where the issue is with the itching skin so I can decide what treatments are acceptable until he sees specialist.

## 2017-02-06 NOTE — Telephone Encounter (Signed)
Pt  Inquiring  On medication  Refill   As  Well   As  A  Request  For   Unknown  Skin  Cream -   Do not  See   A  Prescription  For  The  Cream

## 2017-02-06 NOTE — Telephone Encounter (Signed)
Samples of Caduet are at front desk. Refills of Flonase was sent to [facility] on 01/09/17 with refills. Please advise of skin cream. I do see a referral to Dermatology for the skin lesion of groin region

## 2017-02-07 MED ORDER — TRIAMCINOLONE ACETONIDE 0.1 % EX CREA: 1.0000 "application " | TOPICAL_CREAM | Freq: Two times a day (BID) | CUTANEOUS | 0 refills | Status: DC

## 2017-02-07 NOTE — Telephone Encounter (Signed)
Have sent in Rx Triamcinolone cream to apply twice daily for up to 2 weeks. If not resolved he needs reassessment.

## 2017-02-07 NOTE — Addendum Note (Signed)
Addended by: Brunetta Jeans on: 02/07/2017 01:54 PM   Modules accepted: Orders

## 2017-02-07 NOTE — Telephone Encounter (Signed)
Advised patient that samples of the [name] is up front for pick up. He states at OV was discussion of dry patchy area on back that you would send in rx cream for the area. Please advise

## 2017-02-21 ENCOUNTER — Telehealth: Payer: Self-pay | Admitting: Physician Assistant

## 2017-02-21 NOTE — Telephone Encounter (Signed)
Copied from [place] [id number]. Topic: Inquiry >> Feb 21, 2017  1:14 PM Patrice Paradise wrote: Reason for CRM: Patient would like for Elyn Aquas or his assistant to give him a call @ [phone]. Patient has some concerns about taking the sildenafil (VIAGRA) 50 MG tablet. He did not pick up the first Rx and [facility] cancelled it. But, before [place] call it back in he would like a call.

## 2017-02-21 NOTE — Telephone Encounter (Signed)
LM for patient to call to discuss.  

## 2017-02-21 NOTE — Telephone Encounter (Signed)
Pt requesting a call back from Elyn Aquas regarding Viagra.

## 2017-02-22 MED ORDER — SILDENAFIL CITRATE 50 MG PO TABS: 25.0000 mg | ORAL_TABLET | Freq: Every day | ORAL | 0 refills | Status: DC | PRN

## 2017-02-22 NOTE — Addendum Note (Signed)
Addended by: Katina Dung on: 02/22/2017 01:10 PM   Modules accepted: Orders

## 2017-02-22 NOTE — Telephone Encounter (Signed)
Patient states that questions were answered by pharmacist.   Patient states he needs a new RX for the Sildenafil sent to [facility].   Okay to fill for him?

## 2017-02-22 NOTE — Telephone Encounter (Signed)
Medication filled.  

## 2017-03-26 ENCOUNTER — Ambulatory Visit (INDEPENDENT_AMBULATORY_CARE_PROVIDER_SITE_OTHER): Payer: Commercial Managed Care - PPO | Admitting: Physician Assistant

## 2017-03-26 ENCOUNTER — Other Ambulatory Visit: Payer: Self-pay

## 2017-03-26 ENCOUNTER — Encounter: Payer: Self-pay | Admitting: Physician Assistant

## 2017-03-26 VITALS — BP 120/80 | HR 85 | Temp 98.2°F | Resp 14 | Ht 71.0 in | Wt 299.0 lb

## 2017-03-26 DIAGNOSIS — B9689 Other specified bacterial agents as the cause of diseases classified elsewhere: Secondary | ICD-10-CM | POA: Diagnosis not present

## 2017-03-26 DIAGNOSIS — J019 Acute sinusitis, unspecified: Secondary | ICD-10-CM | POA: Diagnosis not present

## 2017-03-26 MED ORDER — FLUTICASONE PROPIONATE 50 MCG/ACT NA SUSP: 2.0000 | Freq: Every day | NASAL | 6 refills | Status: DC | PRN

## 2017-03-26 MED ORDER — AMOXICILLIN-POT CLAVULANATE 875-125 MG PO TABS: 1.0000 | ORAL_TABLET | Freq: Two times a day (BID) | ORAL | 0 refills | Status: DC

## 2017-03-26 MED ORDER — SILDENAFIL CITRATE 50 MG PO TABS: 100.0000 mg | ORAL_TABLET | Freq: Every day | ORAL | 0 refills | Status: DC | PRN

## 2017-03-26 NOTE — Progress Notes (Signed)
Patient presents to clinic today c/o 2 weeks of sinus pressure and pain with ear pressure, cough and PND. Denies chest congestion or SOB. Unsure of fevers. Denies recent travel or sick contact. Has been taking OTC medications with little relief of symptoms.   Past Medical History:  Diagnosis Date  . Allergy   . DM (diabetes mellitus) ([place])    type 2  . High cholesterol   . Hypertension   . Low testosterone     Current Outpatient Medications on File Prior to Visit  Medication Sig Dispense Refill  . amLODipine-atorvastatin (CADUET) 5-10 MG tablet Take 1 tablet daily by mouth. 30 tablet 0  . aspirin EC 81 MG tablet Take 1 tablet by mouth daily.    . Blood Glucose Monitoring Suppl (ONETOUCH VERIO FLEX SYSTEM) w/Device KIT 1 Units by Does not apply route daily. Dx:E11.9 1 kit 0  . fluticasone (FLONASE) 50 MCG/ACT nasal spray Place 2 sprays daily as needed into both nostrils for allergies or rhinitis. 16 g 6  . glucose blood test strip Use as instructed daily Dx: E11.9 100 each 3  . ibuprofen (ADVIL,MOTRIN) 600 MG tablet Take 1 tablet (600 mg total) by mouth every 8 (eight) hours as needed. 90 tablet 0  . metFORMIN (GLUCOPHAGE) 1000 MG tablet Take 1 tablet (1,000 mg total) by mouth 2 (two) times daily with a meal. 180 tablet 1  . multivitamin (ONE-A-DAY MEN'S) TABS tablet Take 1 tablet by mouth daily.    Glory Rosebush DELICA LANCETS 67E MISC 1 Stick by Does not apply route daily. Dx:E11.9 100 each 3  . sildenafil (VIAGRA) 50 MG tablet Take 0.5-1 tablets (25-50 mg total) by mouth daily as needed for erectile dysfunction. 10 tablet 0  . triamcinolone cream (KENALOG) 0.1 % Apply 1 application topically 2 (two) times daily. 30 g 0   No current facility-administered medications on file prior to visit.     Allergies  Allergen Reactions  . Avelox [Moxifloxacin Hcl In Nacl] Hives and Itching    Family History  Problem Relation Age of Onset  . Aneurysm Mother 36       Deceased  . Healthy  Father        Living  . Prostate cancer Paternal Uncle   . Healthy Daughter        x3  . Colon cancer Neg Hx   . Esophageal cancer Neg Hx   . Rectal cancer Neg Hx   . Stomach cancer Neg Hx     Social History   Socioeconomic History  . Marital status: Single    Spouse name: None  . Number of children: None  . Years of education: None  . Highest education level: None  Social Needs  . Financial resource strain: None  . Food insecurity - worry: None  . Food insecurity - inability: None  . Transportation needs - medical: None  . Transportation needs - non-medical: None  Occupational History  . None  Tobacco Use  . Smoking status: Former Smoker    Packs/day: 0.50    Years: 15.00    Pack years: 7.50    Last attempt to quit: 04/17/2005    Years since quitting: 11.9  . Smokeless tobacco: Never Used  Substance and Sexual Activity  . Alcohol use: No  . Drug use: No  . Sexual activity: None  Other Topics Concern  . None  Social History Narrative  . None   Review of Systems - See HPI.  All other  ROS are negative.  BP 120/80   Pulse 85   Temp 98.2 F (36.8 C) (Oral)   Resp 14   Ht 5' 11" (1.803 m)   Wt 299 lb (135.6 kg)   SpO2 95%   BMI 41.70 kg/m   Physical Exam  Constitutional: He is oriented to person, place, and time and well-developed, well-nourished, and in no distress.  HENT:  Head: Normocephalic and atraumatic.  Right Ear: Tympanic membrane normal.  Left Ear: Tympanic membrane normal.  Nose: Nose normal.  Mouth/Throat: Uvula is midline, oropharynx is clear and moist and mucous membranes are normal.  Eyes: Conjunctivae are normal.  Neck: Neck supple.  Cardiovascular: Normal rate, regular rhythm, normal heart sounds and intact distal pulses.  Pulmonary/Chest: Effort normal and breath sounds normal. No respiratory distress. He has no wheezes. He has no rales. He exhibits no tenderness.  Lymphadenopathy:    He has no cervical adenopathy.  Neurological: He  is alert and oriented to person, place, and time.  Skin: Skin is warm and dry. No rash noted.  Psychiatric: Affect normal.  Vitals reviewed.  Recent Results (from the past 2160 hour(s))  Urine cytology ancillary only     Status: None   Collection Time: 01/21/17 12:00 AM  Result Value Ref Range   Chlamydia Negative     Comment: Normal Reference Range - Negative   Neisseria gonorrhea Negative     Comment: Normal Reference Range - Negative  HIV antibody     Status: None   Collection Time: 01/21/17  4:56 PM  Result Value Ref Range   HIV 1&2 Ab, 4th Generation NON-REACTIVE NON-REACTI    Comment: HIV-1 antigen and HIV-1/HIV-2 antibodies were not detected. There is no laboratory evidence of HIV infection. Marland Kitchen PLEASE NOTE: This information has been disclosed to you from records whose confidentiality may be protected by state law.  If your state requires such protection, then the state law prohibits you from making any further disclosure of the information without the specific written consent of the person to whom it pertains, or as otherwise permitted by law. A general authorization for the release of medical or other information is NOT sufficient for this purpose. . For additional information please refer to http://education.[facility].com/faq/FAQ106 (This link is being provided for informational/ educational purposes only.) . Marland Kitchen The performance of this assay has not been clinically validated in patients less than 55 years old. .   Acute Hep Panel & Hep B Surface Ab     Status: None   Collection Time: 01/21/17  4:56 PM  Result Value Ref Range   Hep A IgM NON-REACTIVE NON-REACTI   Hepatitis B Surface Ag NON-REACTIVE NON-REACTI   Hep B C IgM NON-REACTIVE NON-REACTI   HEPATITIS C ANTIBODY REFILL$(REFL) NON-REACTIVE NON-REACTI   SIGNAL TO CUT-OFF 0.15 <1.00  RPR     Status: None   Collection Time: 01/21/17  4:56 PM  Result Value Ref Range   RPR Ser Ql NON-REACTIVE  NON-REACTI  HSV(herpes smplx)abs-1+2(IgG+IgM)-bld     Status: Abnormal   Collection Time: 01/21/17  4:56 PM  Result Value Ref Range   HSVI/II Comb IgM <0.91 0.00 - 0.90 Ratio    Comment:                                  Negative        <0.91  Equivocal 0.91 - 1.09                                  Positive        >1.09    HSV 1 Glycoprotein G Ab, IgG 41.80 (H) 0.00 - 0.90 index    Comment:                                  Negative        <0.91                                  Equivocal 0.91 - 1.09                                  Positive        >1.09  Note: Negative indicates no antibodies detected to  HSV-1. Equivocal may suggest early infection.  If  clinically appropriate, retest at later date. 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    HSV 2 IgG, Type Spec 8.18 (H) 0.00 - 0.90 index    Comment:                                  Negative        <0.91                                  Equivocal 0.91 - 1.09                                  Positive        >1.09  Note: Negative indicates no antibodies detected to  HSV-2. Equivocal may suggest early infection.  If  clinically appropriate, retest at later date. 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   REFLEX TIQ     Status: None   Collection Time: 01/21/17  4:56 PM  Result Value Ref Range   REFLEX TIQ      Comment: OUR RECORDS INDICATE THAT YOU HAVE ORDERED  ACUTE HEP PNL ORDER CODE 86578.  THIS IS A REFLEX-SPECIFIC ORDER CODE. HOWEVER, ONLY THE INITIAL TEST WAS PERFORMED, BECAUSE WE DO NOT HAVE A REFLEX TESTING AUTHORIZATION FORM ON FILE FOR YOU.  TO  PERFORM A REFLEX TEST WE NEED YOU TO SIGN AN AUTHORIZATION FORM  SPECIFYING (A) THE REFLEXIVE TEST AND (B) THE RESULTS THAT WILL  TRIGGER THE PERFORMANCE OF THE REFLEX TEST.  PLEASE CONTACT THE  CLIENT SERVICE REPRESENTATIVE AT [facility] IF YOU WOULD  LIKE ADDITIONAL TESTING DONE OR CONTACT YOUR SALES REPRESENTATIVE  TO  OBTAIN A COPY OF THE REFLEXIVE TESTING AUTHORIZATION FORM. . .    Assessment/Plan: 1. Acute bacterial sinusitis Rx Augmentin.  Increase fluids.  Rest.  Saline nasal spray.  Probiotic.  Mucinex as directed.  Humidifier in bedroom. Flonase as directed.  Call or return to clinic if symptoms are not improving.  - amoxicillin-clavulanate (AUGMENTIN) 875-125 MG tablet; Take 1 tablet by mouth 2 (two) times daily.  Dispense: 14 tablet; Refill: 0  William Cody Martin, PA-C 

## 2017-03-26 NOTE — Patient Instructions (Signed)
Please take antibiotic as directed.  Increase fluid intake.  Use Saline nasal spray.  Take a daily multivitamin. Tylenol or Ibuprofen for sinus headache and finger pain.  Place a humidifier in the bedroom.  Please call or return clinic if symptoms are not improving.  Start a daily Turmeric supplement to help with arthritic pains.  Sinusitis Sinusitis is redness, soreness, and swelling (inflammation) of the paranasal sinuses. Paranasal sinuses are air pockets within the bones of your face (beneath the eyes, the middle of the forehead, or above the eyes). In healthy paranasal sinuses, mucus is able to drain out, and air is able to circulate through them by way of your nose. However, when your paranasal sinuses are inflamed, mucus and air can become trapped. This can allow bacteria and other germs to grow and cause infection. Sinusitis can develop quickly and last only a short time (acute) or continue over a long period (chronic). Sinusitis that lasts for more than 12 weeks is considered chronic.  CAUSES  Causes of sinusitis include:  Allergies.  Structural abnormalities, such as displacement of the cartilage that separates your nostrils (deviated septum), which can decrease the air flow through your nose and sinuses and affect sinus drainage.  Functional abnormalities, such as when the small hairs (cilia) that line your sinuses and help remove mucus do not work properly or are not present. SYMPTOMS  Symptoms of acute and chronic sinusitis are the same. The primary symptoms are pain and pressure around the affected sinuses. Other symptoms include:  Upper toothache.  Earache.  Headache.  Bad breath.  Decreased sense of smell and taste.  A cough, which worsens when you are lying flat.  Fatigue.  Fever.  Thick drainage from your nose, which often is green and may contain pus (purulent).  Swelling and warmth over the affected sinuses. DIAGNOSIS  Your caregiver will perform a physical  exam. During the exam, your caregiver may:  Look in your nose for signs of abnormal growths in your nostrils (nasal polyps).  Tap over the affected sinus to check for signs of infection.  View the inside of your sinuses (endoscopy) with a special imaging device with a light attached (endoscope), which is inserted into your sinuses. If your caregiver suspects that you have chronic sinusitis, one or more of the following tests may be recommended:  Allergy tests.  Nasal culture A sample of mucus is taken from your nose and sent to a lab and screened for bacteria.  Nasal cytology A sample of mucus is taken from your nose and examined by your caregiver to determine if your sinusitis is related to an allergy. TREATMENT  Most cases of acute sinusitis are related to a viral infection and will resolve on their own within 10 days. Sometimes medicines are prescribed to help relieve symptoms (pain medicine, decongestants, nasal steroid sprays, or saline sprays).  However, for sinusitis related to a bacterial infection, your caregiver will prescribe antibiotic medicines. These are medicines that will help kill the bacteria causing the infection.  Rarely, sinusitis is caused by a fungal infection. In theses cases, your caregiver will prescribe antifungal medicine. For some cases of chronic sinusitis, surgery is needed. Generally, these are cases in which sinusitis recurs more than 3 times per year, despite other treatments. HOME CARE INSTRUCTIONS   Drink plenty of water. Water helps thin the mucus so your sinuses can drain more easily.  Use a humidifier.  Inhale steam 3 to 4 times a day (for example, sit in the  bathroom with the shower running).  Apply a warm, moist washcloth to your face 3 to 4 times a day, or as directed by your caregiver.  Use saline nasal sprays to help moisten and clean your sinuses.  Take over-the-counter or prescription medicines for pain, discomfort, or fever only as  directed by your caregiver. SEEK IMMEDIATE MEDICAL CARE IF:  You have increasing pain or severe headaches.  You have nausea, vomiting, or drowsiness.  You have swelling around your face.  You have vision problems.  You have a stiff neck.  You have difficulty breathing. MAKE SURE YOU:   Understand these instructions.  Will watch your condition.  Will get help right away if you are not doing well or get worse. Document Released: 02/19/2005 Document Revised: 05/14/2011 Document Reviewed: 03/06/2011 [facility] Patient Information 2014 [place], [place].

## 2017-03-28 ENCOUNTER — Telehealth: Payer: Self-pay | Admitting: Physician Assistant

## 2017-03-28 NOTE — Telephone Encounter (Signed)
Generic Viagra refill was sent in to his pharmacy. He was to start an OTC turmeric supplement for arthritic pains as noted in his AVS. Antibiotic was given to cover sinusitis and to help with potential developing cellulitis of finger. If insurance will not cover Flonase he can get OTC or check with insurance to see what alternatives they will cover

## 2017-03-28 NOTE — Telephone Encounter (Signed)
Patient is aware of medications sent to pharmacy.  He is aware that OTC is okay if his insurance will not approve Flonase.  Patient stated verbal understanding.

## 2017-03-28 NOTE — Telephone Encounter (Signed)
In review of chart- medications not mentioned- forward for provider review. Patient also needs alternative for Flonase.

## 2017-03-28 NOTE — Telephone Encounter (Signed)
Copied from [place] [phone]. Topic: Quick Communication - See Telephone Encounter >> Mar 28, 2017  3:47 PM Boyd Kerbs wrote: CRM for notification. See Telephone encounter for:   Patient was in on Monday and [name] was to call in something for pt. Finger swelling and generic Viagra   Pt. Insurance _[employer] will not pay for [facility], asking what else he can get.   May can leave a message on cell phone  [place], [address] [place] [address] Phone: [phone] Fax: [phone]   03/28/17.

## 2017-04-03 ENCOUNTER — Other Ambulatory Visit: Payer: Self-pay | Admitting: Physician Assistant

## 2017-04-15 DIAGNOSIS — L821 Other seborrheic keratosis: Secondary | ICD-10-CM | POA: Diagnosis not present

## 2017-04-15 DIAGNOSIS — L300 Nummular dermatitis: Secondary | ICD-10-CM | POA: Diagnosis not present

## 2017-04-18 ENCOUNTER — Other Ambulatory Visit: Payer: Self-pay | Admitting: Family Medicine

## 2017-04-18 ENCOUNTER — Ambulatory Visit: Payer: Self-pay | Admitting: *Deleted

## 2017-04-18 ENCOUNTER — Encounter: Payer: Self-pay | Admitting: Family Medicine

## 2017-04-18 ENCOUNTER — Telehealth: Payer: Self-pay | Admitting: Physician Assistant

## 2017-04-18 ENCOUNTER — Ambulatory Visit (INDEPENDENT_AMBULATORY_CARE_PROVIDER_SITE_OTHER): Payer: Commercial Managed Care - PPO | Admitting: Family Medicine

## 2017-04-18 VITALS — BP 122/68 | HR 105 | Temp 99.9°F | Ht 71.0 in | Wt 294.0 lb

## 2017-04-18 DIAGNOSIS — R05 Cough: Secondary | ICD-10-CM

## 2017-04-18 DIAGNOSIS — R0989 Other specified symptoms and signs involving the circulatory and respiratory systems: Secondary | ICD-10-CM | POA: Diagnosis not present

## 2017-04-18 DIAGNOSIS — R509 Fever, unspecified: Secondary | ICD-10-CM | POA: Diagnosis not present

## 2017-04-18 DIAGNOSIS — J3489 Other specified disorders of nose and nasal sinuses: Secondary | ICD-10-CM

## 2017-04-18 DIAGNOSIS — R6889 Other general symptoms and signs: Secondary | ICD-10-CM

## 2017-04-18 MED ORDER — OSELTAMIVIR PHOSPHATE 75 MG PO CAPS: 75.0000 mg | ORAL_CAPSULE | Freq: Two times a day (BID) | ORAL | 0 refills | Status: AC

## 2017-04-18 MED ORDER — BALOXAVIR MARBOXIL(80 MG DOSE) 2 X 40 MG PO TBPK: 80.0000 mg | ORAL_TABLET | Freq: Once | ORAL | 0 refills | Status: AC

## 2017-04-18 NOTE — Telephone Encounter (Signed)
Patient wanted the provider to know that he paid for the medication since the insurance company wouldn't pay.  So the provider does not have to send in an alternate prescription.

## 2017-04-18 NOTE — Progress Notes (Signed)
Pre visit review using our clinic review tool, if applicable. No additional management support is needed unless otherwise documented below in the visit note. 

## 2017-04-18 NOTE — Telephone Encounter (Signed)
PA for Tamiflu has been denied by insurance- for plan exclusion.

## 2017-04-18 NOTE — Progress Notes (Signed)
Chief Complaint  Patient presents with  . Chills  . Fever    Edwin [name] here for URI complaints.  Duration: 1 day  Associated symptoms: Fever (103 F), sinus congestion, rhinorrhea and cough Denies: sinus pain, itchy watery eyes, ear pain, ear drainage, sore throat, shortness of breath, myalgia and rigors Treatment to date: Tylenol, Theraflu Sick contacts: No  ROS:  Const: +fevers HEENT: As noted in HPI Lungs: No SOB  Past Medical History:  Diagnosis Date  . Allergy   . DM (diabetes mellitus) ([place])    type 2  . High cholesterol   . Hypertension   . Low testosterone    Family History  Problem Relation Age of Onset  . Aneurysm Mother 1       Deceased  . Healthy Father        Living  . Prostate cancer Paternal Uncle   . Healthy Daughter        x3  . Colon cancer Neg Hx   . Esophageal cancer Neg Hx   . Rectal cancer Neg Hx   . Stomach cancer Neg Hx     BP 122/68 (BP Location: Left Arm, Patient Position: Sitting, Cuff Size: Large)   Pulse (!) 105   Temp 99.9 F (37.7 C) (Oral)   Ht 5\' 11"  (1.803 m)   Wt 294 lb (133.4 kg)   SpO2 96%   BMI 41.00 kg/m  General: Awake, alert, appears stated age HEENT: AT, [place], ears patent b/l and TM's neg, nares patent w/o discharge, pharynx pink and without exudates, MMM Neck: No masses or asymmetry Heart: RRR Lungs: CTAB, no accessory muscle use Psych: Age appropriate judgment and insight, normal mood and affect  Flu-like symptoms - Plan: oseltamivir (TAMIFLU) 75 MG capsule  Orders as above. Continue to push fluids, practice good hand hygiene, cover mouth when coughing. F/u prn. If starting to experience increasing fevers, shaking, or shortness of breath, seek immediate care. Pt voiced understanding and agreement to the plan.  [place], DO 04/18/17 1:44 PM

## 2017-04-18 NOTE — Telephone Encounter (Signed)
Copied from [place] [id number]. Topic: Quick Communication - See Telephone Encounter >> Apr 18, 2017  2:22 PM Synthia Innocent wrote: CRM for notification. See Telephone encounter for: insurance will not cover oseltamivir (TAMIFLU) 75 MG capsule, can something else be called in, [facility] on [address]. Pt saw Dr Nani Ravens today  04/18/17.

## 2017-04-18 NOTE — Patient Instructions (Addendum)
Continue to push fluids, practice good hand hygiene, and cover your mouth if you cough.  If you start having increasing fevers, shaking or shortness of breath, seek immediate care.  Ibuprofen 400-600 mg (2-3 over the counter strength tabs) every 6 hours as needed for pain.  OK to take Tylenol 1000 mg (2 extra strength tabs) or 975 mg (3 regular strength tabs) every 6 hours as needed.  Let us know if you need anything.  

## 2017-04-18 NOTE — Telephone Encounter (Signed)
Pt called because he had an elevated temp of 103 last night with chills. He took Tylenol and Theraflu this morning and now his temp is down to 100.3. He also states he has a nonproductive cough.  He left work early last evening and trip going up the steps and fell on his hip. He states his right hip maybe a little bruised. But not complaining  with that now. His HR was 129 last night and now it is 112. B/P 116/82 now.  Appointment made for today because of unknown reason why he has an elevated temp.   Home care advice given to patient regarding taking Tylenol and what meds may have Tylenol in them. Pt voiced understanding.  Reason for Disposition . Fever present > 3 days (72 hours)  Answer Assessment - Initial Assessment Questions 1. TEMPERATURE: "What is the most recent temperature?"  "How was it measured?"      100.4 2. ONSET: "When did the fever start?"      Yesterday up to 103 3. SYMPTOMS: "Do you have any other symptoms besides the fever?"  (e.g., colds, headache, sore throat, earache, cough, rash, diarrhea, vomiting, abdominal pain)     Cough, chills, nauseated this morning 4. CAUSE: If there are no symptoms, ask: "What do you think is causing the fever?"      Not sure what is causing this 5. CONTACTS: "Does anyone else in the family have an infection?"     no 6. TREATMENT: "What have you done so far to treat this fever?" (e.g., medications)     Tylenol and Theraflu 7. IMMUNOCOMPROMISE: "Do you have of the following: diabetes, HIV positive, splenectomy, cancer chemotherapy, chronic steroid treatment, transplant patient, etc."     Diabetic 8. PREGNANCY: "Is there any chance you are pregnant?" "When was your last menstrual period?"     no 9. TRAVEL: "Have you traveled out of the country in the last month?" (e.g., travel history, exposures)     no  Protocols used: FEVER-A-AH

## 2017-04-18 NOTE — Telephone Encounter (Signed)
Xofluza sent. If not covered, will try Relenza, otherwise he will have to pay out of pocket or stick with only supportive care.

## 2017-04-18 NOTE — Telephone Encounter (Signed)
A PA maybe needed. I have initiated via Covermymeds. Awaiting response.

## 2017-04-18 NOTE — Telephone Encounter (Signed)
Triage note says that patient is scheduled for today  - however provider is not in today.  Patient is on the schedule for 2/15.    I called patient to verify what he was told just to make sure he knew his appointment was tomorrow.  He stated that he was told his appointment was today, and that he was about to leave his house to head over.     Patient was not happy that he was told the wrong day and requested to still see someone today - since his PCP was not available he asked to be seen in [place].  Apologized to patient for the inconvienence and I scheduled him at the [facility] office per his request.

## 2017-04-19 ENCOUNTER — Ambulatory Visit: Payer: Commercial Managed Care - PPO | Admitting: Physician Assistant

## 2017-04-19 ENCOUNTER — Ambulatory Visit: Payer: Commercial Managed Care - PPO | Admitting: Medical

## 2017-04-30 ENCOUNTER — Telehealth: Payer: Self-pay | Admitting: Emergency Medicine

## 2017-04-30 DIAGNOSIS — I10 Essential (primary) hypertension: Secondary | ICD-10-CM

## 2017-04-30 DIAGNOSIS — E785 Hyperlipidemia, unspecified: Secondary | ICD-10-CM

## 2017-04-30 MED ORDER — AMLODIPINE-ATORVASTATIN 5-10 MG PO TABS: 1.0000 | ORAL_TABLET | Freq: Every day | ORAL | 0 refills | Status: DC

## 2017-04-30 NOTE — Telephone Encounter (Signed)
Received Caduet samples 3 #30 bottles from [employer] patient assistance program. Advised patient that samples are ready for pick up at the front desk.

## 2017-05-14 ENCOUNTER — Telehealth: Payer: Self-pay | Admitting: Physician Assistant

## 2017-05-14 DIAGNOSIS — S6991XA Unspecified injury of right wrist, hand and finger(s), initial encounter: Secondary | ICD-10-CM

## 2017-05-14 NOTE — Telephone Encounter (Signed)
Copied from [place] [id number] [id number]. Topic: Quick Communication - See Telephone Encounter >> May 14, 2017  4:21 PM Burnis Medin, NT wrote: CRM for notification. See Telephone encounter for: Patient called and said he was having some issues with his sugar and his finger. Pt would like a call back from the nurse or doctor.   05/14/17.

## 2017-05-15 NOTE — Addendum Note (Signed)
Addended by: Katina Dung on: 05/15/2017 10:09 AM   Modules accepted: Orders

## 2017-05-15 NOTE — Telephone Encounter (Signed)
Referral has been placed. 

## 2017-05-15 NOTE — Telephone Encounter (Signed)
Patient states that these sugar levels were both fasting and non fasting - he is in agreement with working hard on diet/exercise and will call to be seen if there is no improvement.   He states that he would like to go ahead and have a referral to a hand specialist as this is the same thing he has talked with you about.

## 2017-05-15 NOTE — Telephone Encounter (Signed)
If he works hard on diet and exercise and does not note improvement, he can come see me to discuss weight and potential medication options. Would give it a couple of months of working on diet and exercise to see change.  With sugars, are these higher numbers fasting or non-fasting?  In regards to the finger, if this is chronic and no recent change -- would have him see a hand specialist. If there is anything new or acutely worsened, he will need to come see me.

## 2017-05-15 NOTE — Telephone Encounter (Signed)
Ok to place referral.

## 2017-05-15 NOTE — Telephone Encounter (Signed)
Patient states that his blood sugar started going back up (180 is the highest it has gotten) - so he has really started working on diet and exercise.  If he cannot lose weight on his own with the diet and exercise he states he may want to discuss a medication to help out.   He states that he has talked to you in the past about his finger swelling/hurting - he said this is still the same and it is now a constant throbbing.   Routing to PCP to see if there is anything we can do for him, or does he need another appointment for his finger.

## 2017-06-20 DIAGNOSIS — M79644 Pain in right finger(s): Secondary | ICD-10-CM | POA: Diagnosis not present

## 2017-06-20 DIAGNOSIS — M79645 Pain in left finger(s): Secondary | ICD-10-CM | POA: Diagnosis not present

## 2017-07-09 ENCOUNTER — Other Ambulatory Visit: Payer: Self-pay | Admitting: Physician Assistant

## 2017-07-09 NOTE — Telephone Encounter (Signed)
Copied from [place] [id number]. Topic: Quick Communication - Rx Refill/Question >> Jul 09, 2017  1:03 PM Mcneil, Ja-Kwan wrote: Medication: amLODipine-atorvastatin (CADUET) 5-10 MG tablet   Preferred Pharmacy (with phone number or street name): [facility], [place]. [phone] (Phone) [phone] (Fax)  Agent: Please be advised that RX refills may take up to 3 business days. We ask that you follow-up with your pharmacy.

## 2017-07-10 NOTE — Telephone Encounter (Signed)
Please call [place] regarding refill.

## 2017-07-10 NOTE — Telephone Encounter (Signed)
[employer] patient assistance reordered for his Caduet. #30 3 bottles.

## 2017-07-10 NOTE — Telephone Encounter (Signed)
Amlodipine-atorvastatin (Caduet) refill Last OV: 11/26/16 Last Refill:04/30/17 #90 tablets (given samples from office through the pt assistance program)  [place]  PCP: Brunetta Jeans PA-C

## 2017-07-10 NOTE — Telephone Encounter (Signed)
Shipment of medication should come to the office.

## 2017-07-11 NOTE — Telephone Encounter (Signed)
Patient would like to talk to [place] regarding medication, if she can please call him back.

## 2017-07-12 NOTE — Telephone Encounter (Signed)
Returned patient's call to see what he needed. LM that he could call us if needed.   CRM Created. Okay for [facility] to discuss with patient to see what is needed.

## 2017-07-17 ENCOUNTER — Other Ambulatory Visit: Payer: Self-pay | Admitting: Emergency Medicine

## 2017-07-17 DIAGNOSIS — E785 Hyperlipidemia, unspecified: Secondary | ICD-10-CM

## 2017-07-17 DIAGNOSIS — I10 Essential (primary) hypertension: Secondary | ICD-10-CM

## 2017-07-17 DIAGNOSIS — N529 Male erectile dysfunction, unspecified: Secondary | ICD-10-CM

## 2017-07-17 MED ORDER — AMLODIPINE-ATORVASTATIN 5-10 MG PO TABS: 1.0000 | ORAL_TABLET | Freq: Every day | ORAL | 0 refills | Status: DC

## 2017-07-17 MED ORDER — SILDENAFIL CITRATE 50 MG PO TABS
100.0000 mg | ORAL_TABLET | Freq: Every day | ORAL | 0 refills | Status: DC | PRN
Start: 2017-07-17 — End: 2017-10-16

## 2017-07-17 NOTE — Telephone Encounter (Signed)
Caduet samples received on 07/17/17 #90. Patient notified medications samples are ready for pick up. Patient also states that his Sildenafil rx is expensive. Costing him $10 per pill. He only got 3 pills last refill. Per PCP to give rx for generic Viagra to take to [facility] or [facility] for [employer].

## 2017-08-27 ENCOUNTER — Telehealth: Payer: Self-pay | Admitting: Physician Assistant
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
## 2017-08-27 NOTE — Telephone Encounter (Signed)
Reordered on 08/27/17 patient Caduet through [employer] Patient Assistance Program [phone] release on 09/16/17 Next reorder on 11/24/17

## 2017-08-27 NOTE — Telephone Encounter (Signed)
Copied from [place] [id number]. Topic: Quick Communication - Rx Refill/Question >> Aug 27, 2017  8:31 AM Scherrie Gerlach wrote: Medication: amLODipine-atorvastatin (CADUET) 5-10 MG tablet  Pt states this comes from [employer] Patient Assistance ID [id number]

## 2017-09-04 ENCOUNTER — Telehealth: Payer: Self-pay | Admitting: Physician Assistant

## 2017-09-04 ENCOUNTER — Other Ambulatory Visit: Payer: Self-pay | Admitting: Emergency Medicine

## 2017-09-04 DIAGNOSIS — E119 Type 2 diabetes mellitus without complications: Secondary | ICD-10-CM

## 2017-09-04 MED ORDER — METFORMIN HCL 1000 MG PO TABS: ORAL_TABLET | ORAL | 1 refills | Status: DC

## 2017-09-04 NOTE — Telephone Encounter (Signed)
Spoke with patient and advised that refill of the Metformin sent to the [facility].  The Caduet has been reordered and will ship on 09/16/17.  Patient scheduled for a CPE on 09/11/17 with PCP.

## 2017-09-04 NOTE — Telephone Encounter (Signed)
Copied from [place] [id number]. Topic: Quick Communication - See Telephone Encounter >> Sep 04, 2017  2:48 PM Gardiner Ramus wrote: CRM for notification. See Telephone encounter for: 09/04/17. Pt is wanting a call back from [facility] regarding medication metFORMIN (GLUCOPHAGE) 1000 MG tablet. And Caduet. Pt states that he needs a refill but needs to talk to [facility] about this. Please advise [phone]

## 2017-09-11 ENCOUNTER — Other Ambulatory Visit: Payer: Self-pay

## 2017-09-11 ENCOUNTER — Encounter: Payer: Self-pay | Admitting: Physician Assistant

## 2017-09-11 ENCOUNTER — Ambulatory Visit (INDEPENDENT_AMBULATORY_CARE_PROVIDER_SITE_OTHER): Payer: Commercial Managed Care - PPO | Admitting: Physician Assistant

## 2017-09-11 VITALS — BP 120/84 | HR 90 | Temp 98.0°F | Resp 16 | Ht 71.0 in | Wt 300.0 lb

## 2017-09-11 DIAGNOSIS — Z0000 Encounter for general adult medical examination without abnormal findings: Secondary | ICD-10-CM

## 2017-09-11 DIAGNOSIS — Z125 Encounter for screening for malignant neoplasm of prostate: Secondary | ICD-10-CM

## 2017-09-11 DIAGNOSIS — E119 Type 2 diabetes mellitus without complications: Secondary | ICD-10-CM

## 2017-09-11 DIAGNOSIS — I10 Essential (primary) hypertension: Secondary | ICD-10-CM | POA: Diagnosis not present

## 2017-09-11 DIAGNOSIS — E785 Hyperlipidemia, unspecified: Secondary | ICD-10-CM

## 2017-09-11 LAB — LIPID PANEL
Cholesterol: 182 mg/dL (ref 0–200)
HDL: 43.4 mg/dL (ref >39.00)
LDL Cholesterol: 115 mg/dL — ABNORMAL HIGH (ref 0–99)
NonHDL: 138.22
Total CHOL/HDL Ratio: 4
Triglycerides: 114 mg/dL (ref 0.0–149.0)
VLDL: 22.8 mg/dL (ref 0.0–40.0)

## 2017-09-11 LAB — CBC WITH DIFFERENTIAL/PLATELET
Basophils Absolute: 0.1 10*3/uL (ref 0.0–0.1)
Basophils Relative: 0.9 % (ref 0.0–3.0)
Eosinophils Absolute: 0.1 10*3/uL (ref 0.0–0.7)
Eosinophils Relative: 1.1 % (ref 0.0–5.0)
HCT: 44.2 % (ref 39.0–52.0)
Hemoglobin: 14.7 g/dL (ref 13.0–17.0)
Lymphocytes Relative: 33.1 % (ref 12.0–46.0)
Lymphs Abs: 2 10*3/uL (ref 0.7–4.0)
MCHC: 33.2 g/dL (ref 30.0–36.0)
MCV: 81.3 fl (ref 78.0–100.0)
Monocytes Absolute: 0.5 10*3/uL (ref 0.1–1.0)
Monocytes Relative: 9.1 % (ref 3.0–12.0)
Neutro Abs: 3.4 10*3/uL (ref 1.4–7.7)
Neutrophils Relative %: 55.8 % (ref 43.0–77.0)
Platelets: 298 10*3/uL (ref 150.0–400.0)
RBC: 5.44 Mil/uL (ref 4.22–5.81)
RDW: 13.4 % (ref 11.5–15.5)
WBC: 6 10*3/uL (ref 4.0–10.5)

## 2017-09-11 LAB — COMPREHENSIVE METABOLIC PANEL
ALT: 31 U/L (ref 0–53)
AST: 22 U/L (ref 0–37)
Albumin: 4.4 g/dL (ref 3.5–5.2)
Alkaline Phosphatase: 87 U/L (ref 39–117)
BUN: 9 mg/dL (ref 6–23)
CO2: 32 mEq/L (ref 19–32)
Calcium: 9.9 mg/dL (ref 8.4–10.5)
Chloride: 102 mEq/L (ref 96–112)
Creatinine, Ser: 1.01 mg/dL (ref 0.40–1.50)
GFR: 99.79 mL/min (ref >60.00)
Glucose, Bld: 159 mg/dL — ABNORMAL HIGH (ref 70–99)
Potassium: 4.7 mEq/L (ref 3.5–5.1)
Sodium: 140 mEq/L (ref 135–145)
Total Bilirubin: 0.6 mg/dL (ref 0.2–1.2)
Total Protein: 7.5 g/dL (ref 6.0–8.3)

## 2017-09-11 LAB — PSA: PSA: 0.39 ng/mL (ref 0.10–4.00)

## 2017-09-11 LAB — HEMOGLOBIN A1C: Hgb A1c MFr Bld: 7.8 % — ABNORMAL HIGH (ref 4.6–6.5)

## 2017-09-11 NOTE — Progress Notes (Signed)
Patient presents to clinic today for annual exam.  Patient is fasting for labs.  Diet -- Eats a variety of things but does not watch portion sizes. Is eating out a lot.  Exercise -- No current exercise regimen.  Body mass index is 41.84 kg/m.  Acute Concerns: Denies acute concerns at today's visit.   Chronic Issues: Hypertension -- Patient is currently on Caduet containing 5 mg amlodipine. Endorses taking daily as directed. Patient denies chest pain, palpitations, lightheadedness, dizziness, vision changes or frequent headaches.  BP Readings from Last 3 Encounters:  09/11/17 120/84  04/18/17 122/68  03/26/17 120/80   Hyperlipidemia -- Again is taking Caduet containing 10 mg Atorvastatin. Has been doing well on this regimen for some time. As noted above, has very poor diet and is not exercising.   Diabetes Mellitus II -- Currently on metformin 1000 mg BID. Endorses eye exam 3 months ago. Normal per patient. Denies any sores of feet; denies tingling or numbness. Is due for foot examination. Also due for pneumonia vaccine today. Is not checking sugars. Diet and exercise as noted above.   Lab Results  Component Value Date   HGBA1C 6.8 (H) 11/26/2016   Health Maintenance: Immunizations -- Due or Pneumovax due to DM. Declines today. Colonoscopy -- up-to-date.   Past Medical History:  Diagnosis Date  . Allergy   . DM (diabetes mellitus) ([place])    type 2  . High cholesterol   . Hypertension   . Low testosterone     Past Surgical History:  Procedure Laterality Date  . COLONOSCOPY  10 years ago   at [facility] "normal" exam  . NASAL SINUS SURGERY  2010  . UPPER GASTROINTESTINAL ENDOSCOPY  10 years ago  . WISDOM TOOTH EXTRACTION      Current Outpatient Medications on File Prior to Visit  Medication Sig Dispense Refill  . amLODipine-atorvastatin (CADUET) 5-10 MG tablet Take 1 tablet by mouth daily. 90 tablet 0  . aspirin EC 81 MG tablet Take 1 tablet by mouth daily.      . Blood Glucose Monitoring Suppl (ONETOUCH VERIO FLEX SYSTEM) w/Device KIT 1 Units by Does not apply route daily. Dx:E11.9 1 kit 0  . fluticasone (FLONASE) 50 MCG/ACT nasal spray Place 2 sprays into both nostrils daily as needed for allergies or rhinitis. 16 g 6  . glucose blood test strip Use as instructed daily Dx: E11.9 100 each 3  . ibuprofen (ADVIL,MOTRIN) 600 MG tablet Take 1 tablet (600 mg total) by mouth every 8 (eight) hours as needed. 90 tablet 0  . metFORMIN (GLUCOPHAGE) 1000 MG tablet TAKE 1 TABLET BY MOUTH TWICE DAILY WITH A MEAL 180 tablet 1  . multivitamin (ONE-A-DAY MEN'S) TABS tablet Take 1 tablet by mouth daily.    Glory Rosebush DELICA LANCETS 40J MISC 1 Stick by Does not apply route daily. Dx:E11.9 100 each 3  . sildenafil (VIAGRA) 50 MG tablet Take 2 tablets (100 mg total) by mouth daily as needed for erectile dysfunction. 20 tablet 0  . triamcinolone cream (KENALOG) 0.1 % Apply 1 application topically 2 (two) times daily. 30 g 0   No current facility-administered medications on file prior to visit.     Allergies  Allergen Reactions  . Avelox [Moxifloxacin Hcl In Nacl] Hives and Itching    Family History  Problem Relation Age of Onset  . Aneurysm Mother 49       Deceased  . Healthy Father        Living  .  Prostate cancer Paternal Uncle   . Healthy Daughter        x3  . Colon cancer Neg Hx   . Esophageal cancer Neg Hx   . Rectal cancer Neg Hx   . Stomach cancer Neg Hx     Social History   Socioeconomic History  . Marital status: Single    Spouse name: Not on file  . Number of children: Not on file  . Years of education: Not on file  . Highest education level: Not on file  Occupational History  . Not on file  Social Needs  . Financial resource strain: Not on file  . Food insecurity:    Worry: Not on file    Inability: Not on file  . Transportation needs:    Medical: Not on file    Non-medical: Not on file  Tobacco Use  . Smoking status: Former  Smoker    Packs/day: 0.50    Years: 15.00    Pack years: 7.50    Last attempt to quit: 04/17/2005    Years since quitting: 12.4  . Smokeless tobacco: Never Used  Substance and Sexual Activity  . Alcohol use: No  . Drug use: No  . Sexual activity: Yes  Lifestyle  . Physical activity:    Days per week: Not on file    Minutes per session: Not on file  . Stress: Not on file  Relationships  . Social connections:    Talks on phone: Not on file    Gets together: Not on file    Attends religious service: Not on file    Active member of club or organization: Not on file    Attends meetings of clubs or organizations: Not on file    Relationship status: Not on file  . Intimate partner violence:    Fear of current or ex partner: Not on file    Emotionally abused: Not on file    Physically abused: Not on file    Forced sexual activity: Not on file  Other Topics Concern  . Not on file  Social History Narrative  . Not on file   Review of Systems  Constitutional: Negative for fever and weight loss.  HENT: Negative for ear discharge, ear pain, hearing loss and tinnitus.   Eyes: Negative for blurred vision, double vision, photophobia and pain.  Respiratory: Negative for cough and shortness of breath.   Cardiovascular: Negative for chest pain and palpitations.  Gastrointestinal: Negative for abdominal pain, blood in stool, constipation, diarrhea, heartburn, melena, nausea and vomiting.  Genitourinary: Negative for dysuria, flank pain, frequency, hematuria and urgency.  Musculoskeletal: Negative for falls.  Neurological: Negative for dizziness, loss of consciousness and headaches.  Endo/Heme/Allergies: Negative for environmental allergies.  Psychiatric/Behavioral: Negative for depression, hallucinations, substance abuse and suicidal ideas. The patient is not nervous/anxious and does not have insomnia.     BP 120/84   Pulse 90   Temp 98 F (36.7 C) (Oral)   Resp 16   Ht 5' 11" (1.803  m)   Wt 300 lb (136.1 kg)   SpO2 98%   BMI 41.84 kg/m   Physical Exam  Constitutional: He is oriented to person, place, and time. He appears well-developed and well-nourished. No distress.  HENT:  Head: Normocephalic and atraumatic.  Right Ear: Tympanic membrane, external ear and ear canal normal.  Left Ear: Tympanic membrane, external ear and ear canal normal.  Nose: Nose normal.  Mouth/Throat: Oropharynx is clear and moist and mucous  membranes are normal. No posterior oropharyngeal edema or posterior oropharyngeal erythema.  Eyes: Pupils are equal, round, and reactive to light. Conjunctivae are normal.  Neck: Neck supple. No thyromegaly present.  Cardiovascular: Normal rate, regular rhythm, normal heart sounds and intact distal pulses.  Pulmonary/Chest: Effort normal and breath sounds normal. No respiratory distress. He has no wheezes. He has no rales. He exhibits no tenderness.  Abdominal: Soft. Bowel sounds are normal. He exhibits no distension and no mass. There is no tenderness. There is no rebound and no guarding.  Lymphadenopathy:    He has no cervical adenopathy.  Neurological: He is alert and oriented to person, place, and time. No cranial nerve deficit.  Skin: Skin is warm and dry. No rash noted. He is not diaphoretic.  Psychiatric: He has a normal mood and affect.  Vitals reviewed.  Diabetic Foot Exam - Simple   Simple Foot Form Diabetic Foot exam was performed with the following findings:  Yes 09/11/2017 11:04 AM  Visual Inspection No deformities, no ulcerations, no other skin breakdown bilaterally:  Yes Sensation Testing Intact to touch and monofilament testing bilaterally:  Yes Pulse Check Posterior Tibialis and Dorsalis pulse intact bilaterally:  Yes Comments    Assessment/Plan: Visit for preventive health examination Depression screen negative. Health Maintenance reviewed. Declines pneumonia vaccine today. Colonoscopy up-to-date. Preventive schedule  discussed and handout given in AVS. Will obtain fasting labs today.   Prostate cancer screening The natural history of prostate cancer and ongoing controversy regarding screening and potential treatment outcomes of prostate cancer has been discussed with the patient. The meaning of a false positive PSA and a false negative PSA has been discussed. He indicates understanding of the limitations of this screening test and wishes to proceed with screening PSA testing.   Hyperlipidemia Taking statin as directed. Reviewed proper diet and exercise with patient. Fasting lipids and LFT today.   Diabetes mellitus type II, controlled Taking medication as directed. Needs significant improvement in diet and exercise. Recommendations given along with handout. Will monitor. Repeat labs today. Declines pneumonia vaccine. Foot exam updated today. No abnormal findings. Will need to obtain records from optometrist for review. Repeat A1C, CMP and Lipids today. Will alter regimen according to results.   Essential hypertension, benign Well-controlled. Continue current regimen. Repeat labs today.    Leeanne Rio, PA-C

## 2017-09-11 NOTE — Assessment & Plan Note (Signed)
The natural history of prostate cancer and ongoing controversy regarding screening and potential treatment outcomes of prostate cancer has been discussed with the patient. The meaning of a false positive PSA and a false negative PSA has been discussed. He indicates understanding of the limitations of this screening test and wishes  to proceed with screening PSA testing.  

## 2017-09-11 NOTE — Assessment & Plan Note (Signed)
Taking medication as directed. Needs significant improvement in diet and exercise. Recommendations given along with handout. Will monitor. Repeat labs today. Declines pneumonia vaccine. Foot exam updated today. No abnormal findings. Will need to obtain records from optometrist for review. Repeat A1C, CMP and Lipids today. Will alter regimen according to results.

## 2017-09-11 NOTE — Assessment & Plan Note (Signed)
Depression screen negative. Health Maintenance reviewed. Declines pneumonia vaccine today. Colonoscopy up-to-date. Preventive schedule discussed and handout given in AVS. Will obtain fasting labs today.

## 2017-09-11 NOTE — Assessment & Plan Note (Signed)
Well-controlled. Continue current regimen. Repeat labs today.

## 2017-09-11 NOTE — Assessment & Plan Note (Signed)
Taking statin as directed. Reviewed proper diet and exercise with patient. Fasting lipids and LFT today.

## 2017-09-11 NOTE — Patient Instructions (Signed)
Please go to the lab for blood work.  Our office will call you with your results unless you have chosen to receive results via MyChart. If your blood work is normal we will follow-up each year for physicals and as scheduled for chronic medical problems. If anything is abnormal we will treat accordingly and get you in for a follow-up.  Please continue medications as directed. I really want you to focus on diet and exercise. Try to work up to a goal of 150 minutes per week of moderate-intensity aerobic activities (things that get your heart rate up). Try to meal prep when possible. I know you are on the road a lot and it is impossible to completely avoid fast food -- try to pick grilled options and get a healthy side (baked potato or fruit) instead of fries.    Preventive Care 40-64 Years, Male Preventive care refers to lifestyle choices and visits with your health care provider that can promote health and wellness. What does preventive care include?  A yearly physical exam. This is also called an annual well check.  Dental exams once or twice a year.  Routine eye exams. Ask your health care provider how often you should have your eyes checked.  Personal lifestyle choices, including: ? Daily care of your teeth and gums. ? Regular physical activity. ? Eating a healthy diet. ? Avoiding tobacco and drug use. ? Limiting alcohol use. ? Practicing safe sex. ? Taking low-dose aspirin every day starting at age 59. What happens during an annual well check? The services and screenings done by your health care provider during your annual well check will depend on your age, overall health, lifestyle risk factors, and family history of disease. Counseling Your health care provider may ask you questions about your:  Alcohol use.  Tobacco use.  Drug use.  Emotional well-being.  Home and relationship well-being.  Sexual activity.  Eating habits.  Work and work  Statistician.  Screening You may have the following tests or measurements:  Height, weight, and BMI.  Blood pressure.  Lipid and cholesterol levels. These may be checked every 5 years, or more frequently if you are over 30 years old.  Skin check.  Lung cancer screening. You may have this screening every year starting at age 21 if you have a 30-pack-year history of smoking and currently smoke or have quit within the past 15 years.  Fecal occult blood test (FOBT) of the stool. You may have this test every year starting at age 95.  Flexible sigmoidoscopy or colonoscopy. You may have a sigmoidoscopy every 5 years or a colonoscopy every 10 years starting at age 68.  Prostate cancer screening. Recommendations will vary depending on your family history and other risks.  Hepatitis C blood test.  Hepatitis B blood test.  Sexually transmitted disease (STD) testing.  Diabetes screening. This is done by checking your blood sugar (glucose) after you have not eaten for a while (fasting). You may have this done every 1-3 years.  Discuss your test results, treatment options, and if necessary, the need for more tests with your health care provider. Vaccines Your health care provider may recommend certain vaccines, such as:  Influenza vaccine. This is recommended every year.  Tetanus, diphtheria, and acellular pertussis (Tdap, Td) vaccine. You may need a Td booster every 10 years.  Varicella vaccine. You may need this if you have not been vaccinated.  Zoster vaccine. You may need this after age 51.  Measles, mumps, and rubella (  MMR) vaccine. You may need at least one dose of MMR if you were born in 1957 or later. You may also need a second dose.  Pneumococcal 13-valent conjugate (PCV13) vaccine. You may need this if you have certain conditions and have not been vaccinated.  Pneumococcal polysaccharide (PPSV23) vaccine. You may need one or two doses if you smoke cigarettes or if you have  certain conditions.  Meningococcal vaccine. You may need this if you have certain conditions.  Hepatitis A vaccine. You may need this if you have certain conditions or if you travel or work in places where you may be exposed to hepatitis A.  Hepatitis B vaccine. You may need this if you have certain conditions or if you travel or work in places where you may be exposed to hepatitis B.  Haemophilus influenzae type b (Hib) vaccine. You may need this if you have certain risk factors.  Talk to your health care provider about which screenings and vaccines you need and how often you need them. This information is not intended to replace advice given to you by your health care provider. Make sure you discuss any questions you have with your health care provider. Document Released: 03/18/2015 Document Revised: 11/09/2015 Document Reviewed: 12/21/2014 Elsevier Interactive Patient Education  [employer].

## 2017-09-13 ENCOUNTER — Other Ambulatory Visit: Payer: Self-pay | Admitting: Physician Assistant

## 2017-09-13 DIAGNOSIS — E119 Type 2 diabetes mellitus without complications: Secondary | ICD-10-CM

## 2017-09-13 MED ORDER — EMPAGLIFLOZIN 10 MG PO TABS: 10.0000 mg | ORAL_TABLET | Freq: Every day | ORAL | 1 refills | Status: DC

## 2017-09-17 ENCOUNTER — Telehealth: Payer: Self-pay | Admitting: Physician Assistant

## 2017-09-17 ENCOUNTER — Other Ambulatory Visit: Payer: Self-pay | Admitting: Physician Assistant

## 2017-09-17 DIAGNOSIS — E785 Hyperlipidemia, unspecified: Secondary | ICD-10-CM

## 2017-09-17 MED ORDER — ATORVASTATIN CALCIUM 10 MG PO TABS: 10.0000 mg | ORAL_TABLET | Freq: Every day | ORAL | 3 refills | Status: DC

## 2017-09-17 NOTE — Telephone Encounter (Signed)
Copied from [place] [id number]. Topic: Quick Communication - See Telephone Encounter >> Sep 17, 2017  1:30 PM Gardiner Ramus wrote: CRM for notification. See Telephone encounter for: 09/17/17. Pt called and wanted to make sure that he can take Empagliflozin and metFORMIN together. Please advise

## 2017-09-17 NOTE — Telephone Encounter (Signed)
Advised patient to continue the Metformin along with the Januvia.

## 2017-09-17 NOTE — Telephone Encounter (Signed)
He absolutely is to take those together. They work together to lower sugar levels.

## 2017-09-23 ENCOUNTER — Other Ambulatory Visit: Payer: Self-pay | Admitting: Emergency Medicine

## 2017-09-23 DIAGNOSIS — E785 Hyperlipidemia, unspecified: Secondary | ICD-10-CM

## 2017-09-23 DIAGNOSIS — I10 Essential (primary) hypertension: Secondary | ICD-10-CM

## 2017-09-23 MED ORDER — AMLODIPINE-ATORVASTATIN 5-10 MG PO TABS: 1.0000 | ORAL_TABLET | Freq: Every day | ORAL | 0 refills | Status: DC

## 2017-10-14 ENCOUNTER — Other Ambulatory Visit: Payer: Self-pay

## 2017-10-14 ENCOUNTER — Telehealth: Payer: Self-pay | Admitting: Physician Assistant

## 2017-10-14 DIAGNOSIS — E119 Type 2 diabetes mellitus without complications: Secondary | ICD-10-CM

## 2017-10-14 MED ORDER — EMPAGLIFLOZIN 10 MG PO TABS: 10.0000 mg | ORAL_TABLET | Freq: Every day | ORAL | 1 refills | Status: DC

## 2017-10-14 NOTE — Telephone Encounter (Signed)
Ok to send in refill of Jardiance 90 with 1 RF. Due for DM follow-up in October/Novemeber. The medication does not have to be picked up -- can be sent directly to his local pharmacy. I am not sure who told he we would have an Rx for pickup here.

## 2017-10-14 NOTE — Telephone Encounter (Signed)
Called patient and he stated that this medication is over $400 per month. Please advise if you want me to start the patient assistance program or if there is a different medication that his insurance will cover?

## 2017-10-14 NOTE — Telephone Encounter (Signed)
Please see message.  Last OV 09/11/17, Next OV 10/29/17  Lab note from 09/11/17

## 2017-10-14 NOTE — Telephone Encounter (Signed)
Called patient and let him know to contact his insurance company to see what they will cover that is comparable to the [place].  Patient stated that he will call us back and let us know the name of the medication that they cover.

## 2017-10-14 NOTE — Telephone Encounter (Signed)
Have him call his insurance to discuss what alternatives in the same class as Jardiance they will pay for.  If they will not we will work on getting him in the assistance program or try to find other vouchers for him to use.

## 2017-10-14 NOTE — Telephone Encounter (Signed)
Copied from [place] [id number]. Topic: Quick Communication - See Telephone Encounter >> Oct 14, 2017 11:25 AM Bea Graff, NT wrote: CRM for notification. See Telephone encounter for: 10/14/17. Pt states he was told to call when he was about out of his empagliflozin (JARDIANCE) 10 MG TABS tablet and that [name] would have a rx ready for him to pick up at the office on Wednesday. Please advise.

## 2017-10-15 NOTE — Telephone Encounter (Signed)
Pt called and stated that [place] would be cover by insurance. Please advise . HV#[phone]

## 2017-10-15 NOTE — Telephone Encounter (Signed)
Please see message.  Please advise. 

## 2017-10-16 ENCOUNTER — Encounter: Payer: Self-pay | Admitting: Physician Assistant

## 2017-10-16 ENCOUNTER — Other Ambulatory Visit: Payer: Self-pay

## 2017-10-16 ENCOUNTER — Ambulatory Visit (INDEPENDENT_AMBULATORY_CARE_PROVIDER_SITE_OTHER): Payer: Commercial Managed Care - PPO | Admitting: Physician Assistant

## 2017-10-16 VITALS — BP 124/82 | HR 93 | Temp 98.1°F | Ht 71.0 in | Wt 294.6 lb

## 2017-10-16 DIAGNOSIS — B3742 Candidal balanitis: Secondary | ICD-10-CM

## 2017-10-16 DIAGNOSIS — E785 Hyperlipidemia, unspecified: Secondary | ICD-10-CM | POA: Diagnosis not present

## 2017-10-16 DIAGNOSIS — E119 Type 2 diabetes mellitus without complications: Secondary | ICD-10-CM

## 2017-10-16 DIAGNOSIS — K5900 Constipation, unspecified: Secondary | ICD-10-CM

## 2017-10-16 MED ORDER — FLUCONAZOLE 150 MG PO TABS: 150.0000 mg | ORAL_TABLET | Freq: Once | ORAL | 0 refills | Status: AC

## 2017-10-16 MED ORDER — SITAGLIPTIN PHOSPHATE 50 MG PO TABS: 50.0000 mg | ORAL_TABLET | Freq: Every day | ORAL | 5 refills | Status: DC

## 2017-10-16 NOTE — Telephone Encounter (Signed)
Stop the Jardiance. Will start Januvia at 50 mg daily. Rx has been sent in. Follow-up 3-4 weeks for assessment.

## 2017-10-16 NOTE — Patient Instructions (Signed)
Please go to the lab today for blood work.  I will call you with your results. We will alter treatment regimen(s) if indicated by your results.   I encourage you to increase hydration and the amount of fiber in your diet.  Start a daily probiotic (Align, Culturelle, Digestive Advantage, etc.). If no bowel movement within 24 hours, take 2 Tbs of Milk of Magnesia in a 4 oz glass of warmed prune juice every 2-3 days to help promote bowel movement. If no results within 24 hours, then repeat above regimen, adding a Dulcolax stool softener to regimen. If this does not promote a bowel movement, please call the office.  Please take the diflucan as directed for the yeast. This should clear things up -- do not take the extra tablet of the atorvastatin the day you take this medication. Let me know if symptoms are not resolving.  Stop the Jardiance and start the Januvia as directed using the voucher given.  Let me know if there is any further issue with cost.   Follow-up in 4 weeks for reassessment of glucose levels.

## 2017-10-16 NOTE — Telephone Encounter (Signed)
Called patient and gave him message from [place] to stop the [place] and to start the Januvia 50 mg daily. Rx has been sent to the [facility]. Follow up in 3-4 weeks for assessment.  Patient stated that he has an appointment today with [name] and wants to keep the appointment today and will make a follow up appointment for the [facility] when he is here.

## 2017-10-16 NOTE — Progress Notes (Signed)
Patient presents to clinic today c/o constipation for the past week. Denies any known change in activity level. Has been changing his diet to be more healthy, cutting out greasy, fried foods and caffeine. Has been able to pass gas without difficulty. Has had a BM every other day but has been very small amount until this morning when he had a full bowel movement. Notes he took a bottle of magnesium citrate last night which helped to flush things out. Denies any nausea/vomiting, abdominal pain. Denies melena, hematochezia or tenesmus. Notes initial stool this morning was hard.    Patient also endorses 3 days of itching, redness and mild swelling of penis. Denies any known trauma or inury. No recent sexual activity. Is a Type II DM, currently on SGLT 2 therapy.   Past Medical History:  Diagnosis Date  . Allergy   . DM (diabetes mellitus) ([place])    type 2  . High cholesterol   . Hypertension   . Low testosterone     Current Outpatient Medications on File Prior to Visit  Medication Sig Dispense Refill  . amLODipine-atorvastatin (CADUET) 5-10 MG tablet Take 1 tablet by mouth daily. 90 tablet 0  . aspirin EC 81 MG tablet Take 1 tablet by mouth daily.    Marland Kitchen atorvastatin (LIPITOR) 10 MG tablet Take 1 tablet (10 mg total) by mouth daily. 30 tablet 3  . Blood Glucose Monitoring Suppl (ONETOUCH VERIO FLEX SYSTEM) w/Device KIT 1 Units by Does not apply route daily. Dx:E11.9 1 kit 0  . fluticasone (FLONASE) 50 MCG/ACT nasal spray Place 2 sprays into both nostrils daily as needed for allergies or rhinitis. 16 g 6  . glucose blood test strip Use as instructed daily Dx: E11.9 100 each 3  . ibuprofen (ADVIL,MOTRIN) 600 MG tablet Take 1 tablet (600 mg total) by mouth every 8 (eight) hours as needed. 90 tablet 0  . metFORMIN (GLUCOPHAGE) 1000 MG tablet TAKE 1 TABLET BY MOUTH TWICE DAILY WITH A MEAL 180 tablet 1  . multivitamin (ONE-A-DAY MEN'S) TABS tablet Take 1 tablet by mouth daily.    Glory Rosebush DELICA  LANCETS 49I MISC 1 Stick by Does not apply route daily. Dx:E11.9 100 each 3  . sildenafil (VIAGRA) 50 MG tablet Take 2 tablets (100 mg total) by mouth daily as needed for erectile dysfunction. 20 tablet 0  . sitaGLIPtin (JANUVIA) 50 MG tablet Take 1 tablet (50 mg total) by mouth daily. 30 tablet 5  . triamcinolone cream (KENALOG) 0.1 % Apply 1 application topically 2 (two) times daily. 30 g 0   No current facility-administered medications on file prior to visit.     Allergies  Allergen Reactions  . Avelox [Moxifloxacin Hcl In Nacl] Hives and Itching    Family History  Problem Relation Age of Onset  . Aneurysm Mother 19       Deceased  . Healthy Father        Living  . Prostate cancer Paternal Uncle   . Healthy Daughter        x3  . Colon cancer Neg Hx   . Esophageal cancer Neg Hx   . Rectal cancer Neg Hx   . Stomach cancer Neg Hx     Social History   Socioeconomic History  . Marital status: Single    Spouse name: Not on file  . Number of children: Not on file  . Years of education: Not on file  . Highest education level: Not on file  Occupational  History  . Not on file  Social Needs  . Financial resource strain: Not on file  . Food insecurity:    Worry: Not on file    Inability: Not on file  . Transportation needs:    Medical: Not on file    Non-medical: Not on file  Tobacco Use  . Smoking status: Former Smoker    Packs/day: 0.50    Years: 15.00    Pack years: 7.50    Last attempt to quit: 04/17/2005    Years since quitting: 12.5  . Smokeless tobacco: Never Used  Substance and Sexual Activity  . Alcohol use: No  . Drug use: No  . Sexual activity: Yes  Lifestyle  . Physical activity:    Days per week: Not on file    Minutes per session: Not on file  . Stress: Not on file  Relationships  . Social connections:    Talks on phone: Not on file    Gets together: Not on file    Attends religious service: Not on file    Active member of club or organization:  Not on file    Attends meetings of clubs or organizations: Not on file    Relationship status: Not on file  Other Topics Concern  . Not on file  Social History Narrative  . Not on file   Review of Systems - See HPI.  All other ROS are negative.  BP 124/82   Pulse 93   Temp 98.1 F (36.7 C)   Ht 5' 11"  (1.803 m)   Wt 294 lb 9.6 oz (133.6 kg)   SpO2 96%   BMI 41.09 kg/m   Physical Exam  Constitutional: He is oriented to person, place, and time. He appears well-developed and well-nourished.  HENT:  Head: Normocephalic and atraumatic.  Neck: Neck supple. No thyromegaly present.  Cardiovascular: Normal rate, regular rhythm, normal heart sounds and intact distal pulses.  Pulmonary/Chest: Effort normal.  Abdominal: Soft. Bowel sounds are normal. He exhibits no distension and no mass. There is no tenderness. There is no rebound and no guarding. No hernia.  Genitourinary: Circumcised.     Lymphadenopathy:    He has no cervical adenopathy.  Neurological: He is alert and oriented to person, place, and time.  Psychiatric: He has a normal mood and affect.  Vitals reviewed.   Recent Results (from the past 2160 hour(s))  CBC with Differential/Platelet     Status: None   Collection Time: 09/11/17 11:36 AM  Result Value Ref Range   WBC 6.0 4.0 - 10.5 K/uL   RBC 5.44 4.22 - 5.81 Mil/uL   Hemoglobin 14.7 13.0 - 17.0 g/dL   HCT 44.2 39.0 - 52.0 %   MCV 81.3 78.0 - 100.0 fl   MCHC 33.2 30.0 - 36.0 g/dL   RDW 13.4 11.5 - 15.5 %   Platelets 298.0 150.0 - 400.0 K/uL   Neutrophils Relative % 55.8 43.0 - 77.0 %   Lymphocytes Relative 33.1 12.0 - 46.0 %   Monocytes Relative 9.1 3.0 - 12.0 %   Eosinophils Relative 1.1 0.0 - 5.0 %   Basophils Relative 0.9 0.0 - 3.0 %   Neutro Abs 3.4 1.4 - 7.7 K/uL   Lymphs Abs 2.0 0.7 - 4.0 K/uL   Monocytes Absolute 0.5 0.1 - 1.0 K/uL   Eosinophils Absolute 0.1 0.0 - 0.7 K/uL   Basophils Absolute 0.1 0.0 - 0.1 K/uL  Comprehensive metabolic panel      Status: Abnormal  Collection Time: 09/11/17 11:36 AM  Result Value Ref Range   Sodium 140 135 - 145 mEq/L   Potassium 4.7 3.5 - 5.1 mEq/L   Chloride 102 96 - 112 mEq/L   CO2 32 19 - 32 mEq/L   Glucose, Bld 159 (H) 70 - 99 mg/dL   BUN 9 6 - 23 mg/dL   Creatinine, Ser 1.01 0.40 - 1.50 mg/dL   Total Bilirubin 0.6 0.2 - 1.2 mg/dL   Alkaline Phosphatase 87 39 - 117 U/L   AST 22 0 - 37 U/L   ALT 31 0 - 53 U/L   Total Protein 7.5 6.0 - 8.3 g/dL   Albumin 4.4 3.5 - 5.2 g/dL   Calcium 9.9 8.4 - 10.5 mg/dL   GFR 99.79 >60.00 mL/min  Lipid panel     Status: Abnormal   Collection Time: 09/11/17 11:36 AM  Result Value Ref Range   Cholesterol 182 0 - 200 mg/dL    Comment: ATP III Classification       Desirable:  < 200 mg/dL               Borderline High:  200 - 239 mg/dL          High:  > = 240 mg/dL   Triglycerides 114.0 0.0 - 149.0 mg/dL    Comment: Normal:  <150 mg/dLBorderline High:  150 - 199 mg/dL   HDL 43.40 >39.00 mg/dL   VLDL 22.8 0.0 - 40.0 mg/dL   LDL Cholesterol 115 (H) 0 - 99 mg/dL   Total CHOL/HDL Ratio 4     Comment:                Men          Women1/2 Average Risk     3.4          3.3Average Risk          5.0          4.42X Average Risk          9.6          7.13X Average Risk          15.0          11.0                       NonHDL 138.22     Comment: NOTE:  Non-HDL goal should be 30 mg/dL higher than patient's LDL goal (i.e. LDL goal of < 70 mg/dL, would have non-HDL goal of < 100 mg/dL)  Hemoglobin A1c     Status: Abnormal   Collection Time: 09/11/17 11:36 AM  Result Value Ref Range   Hgb A1c MFr Bld 7.8 (H) 4.6 - 6.5 %    Comment: Glycemic Control Guidelines for People with Diabetes:Non Diabetic:  <6%Goal of Therapy: <7%Additional Action Suggested:  >8%   PSA     Status: None   Collection Time: 09/11/17 11:36 AM  Result Value Ref Range   PSA 0.39 0.10 - 4.00 ng/mL    Comment: Test performed using Access Hybritech PSA Assay, a parmagnetic partical, chemiluminecent  immunoassay.    Assessment/Plan: 1. Hyperlipidemia, unspecified hyperlipidemia type Taking medications as directed. Is due for repeat labs.  - Hepatic function panel - Lipid Profile  2. Controlled type 2 diabetes mellitus without complication, without long-term current use of insulin ([place]) Unable to afford Jardiance any longer. Will switch to Januvia 50 mg daily. Voucher given. Follow-up 1 month discussed.  3. Candidal balanitis Secondary to DM and SGLT2 use which has now been discontinued. Rx Diflucan.   4. Constipation, unspecified constipation type Resolved. Exam unremarkable. Home bowel regimen given. Follow-up PRN.   Leeanne Rio, PA-C

## 2017-10-17 LAB — LIPID PANEL
CHOL/HDL RATIO: 5
Cholesterol: 164 mg/dL (ref 0–200)
HDL: 34.4 mg/dL — ABNORMAL LOW (ref >39.00)
NONHDL: 129.41
Triglycerides: 211 mg/dL — ABNORMAL HIGH (ref 0.0–149.0)
VLDL: 42.2 mg/dL — ABNORMAL HIGH (ref 0.0–40.0)

## 2017-10-17 LAB — HEPATIC FUNCTION PANEL
ALK PHOS: 87 U/L (ref 39–117)
ALT: 20 U/L (ref 0–53)
AST: 16 U/L (ref 0–37)
Albumin: 4.4 g/dL (ref 3.5–5.2)
BILIRUBIN TOTAL: 0.6 mg/dL (ref 0.2–1.2)
Bilirubin, Direct: 0.1 mg/dL (ref 0.0–0.3)
Total Protein: 7.5 g/dL (ref 6.0–8.3)

## 2017-10-17 LAB — LDL CHOLESTEROL, DIRECT: Direct LDL: 113 mg/dL

## 2017-10-21 ENCOUNTER — Other Ambulatory Visit: Payer: Self-pay

## 2017-10-21 DIAGNOSIS — E782 Mixed hyperlipidemia: Secondary | ICD-10-CM

## 2017-10-21 DIAGNOSIS — E119 Type 2 diabetes mellitus without complications: Secondary | ICD-10-CM

## 2017-10-21 MED ORDER — ATORVASTATIN CALCIUM 20 MG PO TABS: 20.0000 mg | ORAL_TABLET | Freq: Every day | ORAL | 3 refills | Status: DC

## 2017-10-29 ENCOUNTER — Ambulatory Visit: Payer: Commercial Managed Care - PPO | Admitting: Physician Assistant

## 2017-11-12 ENCOUNTER — Telehealth: Payer: Self-pay | Admitting: Physician Assistant

## 2017-11-12 NOTE — Telephone Encounter (Signed)
Copied from [place] [id number]. Topic: Quick Communication - See Telephone Encounter >> Nov 12, 2017  4:01 PM Conception Chancy, NT wrote: CRM for notification. See Telephone encounter for: 11/12/17.  Patient is calling and states he is needing amLODipine-atorvastatin (CADUET) 5-10 MG tablet ordered from [facility] and he is needing samples of sitaGLIPtin (JANUVIA) 50 MG tablet. Please advise.

## 2017-11-13 NOTE — Telephone Encounter (Signed)
Discount card given to patient for the Januvia.

## 2017-11-13 NOTE — Telephone Encounter (Signed)
Called and reordered patient Edwin [name] 5/10 mg QD medication 11/24/17 released and will be shipped #[id number] Patient will be due for renewal on 03/04/2018.

## 2017-11-18 NOTE — Telephone Encounter (Signed)
Pt states that the Januvia still $348 even with the discount card. Also he states that [name] ordered at amlodipine 20mg  and that he needs that sent to the pharmacy.

## 2017-11-19 NOTE — Telephone Encounter (Signed)
LMOVM advising patient for the high cost of the Januvia, we can apply for patient assistance program. The paperwork is available for pick up for completion. Edwin [name] can start patient on another diabetic medication while waiting on approval of assistance Needed clarification if he is needing refill of the Atorvastatin 20 mg QD? CRM created and ok for PEC to ask questions

## 2017-11-20 ENCOUNTER — Other Ambulatory Visit: Payer: Self-pay | Admitting: Physician Assistant

## 2017-11-20 MED ORDER — GLIPIZIDE ER 5 MG PO TB24: 5.0000 mg | ORAL_TABLET | Freq: Every day | ORAL | 1 refills | Status: DC

## 2017-11-20 NOTE — Telephone Encounter (Signed)
Patient came into the office to complete paperwork for the patient assistance program for the [place]. PCP sent in alternative medication to take while waiting on approval for patient assistance.

## 2017-11-21 NOTE — Telephone Encounter (Signed)
Application mailed to patient assistance program

## 2017-12-04 ENCOUNTER — Other Ambulatory Visit: Payer: Commercial Managed Care - PPO

## 2017-12-04 ENCOUNTER — Other Ambulatory Visit: Payer: Self-pay | Admitting: Emergency Medicine

## 2017-12-04 DIAGNOSIS — E785 Hyperlipidemia, unspecified: Secondary | ICD-10-CM

## 2017-12-04 DIAGNOSIS — I10 Essential (primary) hypertension: Secondary | ICD-10-CM

## 2017-12-04 MED ORDER — AMLODIPINE-ATORVASTATIN 5-10 MG PO TABS: 1.0000 | ORAL_TABLET | Freq: Every day | ORAL | 0 refills | Status: DC

## 2017-12-11 ENCOUNTER — Other Ambulatory Visit (INDEPENDENT_AMBULATORY_CARE_PROVIDER_SITE_OTHER): Payer: Commercial Managed Care - PPO

## 2017-12-11 DIAGNOSIS — E782 Mixed hyperlipidemia: Secondary | ICD-10-CM | POA: Diagnosis not present

## 2017-12-11 DIAGNOSIS — E119 Type 2 diabetes mellitus without complications: Secondary | ICD-10-CM | POA: Diagnosis not present

## 2017-12-11 LAB — LIPID PANEL
CHOLESTEROL: 165 mg/dL (ref 0–200)
HDL: 44.6 mg/dL (ref >39.00)
LDL CALC: 104 mg/dL — AB (ref 0–99)
NonHDL: 119.99
Total CHOL/HDL Ratio: 4
Triglycerides: 82 mg/dL (ref 0.0–149.0)
VLDL: 16.4 mg/dL (ref 0.0–40.0)

## 2017-12-11 LAB — HEPATIC FUNCTION PANEL
ALBUMIN: 4.7 g/dL (ref 3.5–5.2)
ALT: 26 U/L (ref 0–53)
AST: 15 U/L (ref 0–37)
Alkaline Phosphatase: 94 U/L (ref 39–117)
Bilirubin, Direct: 0.1 mg/dL (ref 0.0–0.3)
TOTAL PROTEIN: 8 g/dL (ref 6.0–8.3)
Total Bilirubin: 0.5 mg/dL (ref 0.2–1.2)

## 2017-12-11 LAB — HEMOGLOBIN A1C: Hgb A1c MFr Bld: 7.3 % — ABNORMAL HIGH (ref 4.6–6.5)

## 2018-01-05 ENCOUNTER — Other Ambulatory Visit: Payer: Self-pay | Admitting: Physician Assistant

## 2018-01-07 ENCOUNTER — Other Ambulatory Visit: Payer: Self-pay | Admitting: Emergency Medicine

## 2018-01-07 MED ORDER — SITAGLIPTIN PHOSPHATE 50 MG PO TABS: 50.0000 mg | ORAL_TABLET | Freq: Every day | ORAL | 3 refills | Status: DC

## 2018-01-07 NOTE — Progress Notes (Signed)
Advised patient that patient assistance from [employer] patient assistance received today.  Patient aware. Per PCP to stop the Glipizide and start the Januvia.

## 2018-01-20 ENCOUNTER — Other Ambulatory Visit: Payer: Self-pay | Admitting: Physician Assistant

## 2018-02-10 ENCOUNTER — Other Ambulatory Visit: Payer: Self-pay | Admitting: Emergency Medicine

## 2018-02-10 ENCOUNTER — Telehealth: Payer: Self-pay | Admitting: Physician Assistant

## 2018-02-10 DIAGNOSIS — E785 Hyperlipidemia, unspecified: Secondary | ICD-10-CM

## 2018-02-10 DIAGNOSIS — I10 Essential (primary) hypertension: Secondary | ICD-10-CM

## 2018-02-10 MED ORDER — AMLODIPINE-ATORVASTATIN 5-10 MG PO TABS: 1.0000 | ORAL_TABLET | Freq: Every day | ORAL | 1 refills | Status: DC

## 2018-02-10 NOTE — Telephone Encounter (Signed)
[place] for refill of Caduet medication. No more refills are on the rx.  New rx for Caduet faxed to [place] [facility] for refills. Patient is due for application for [phone]

## 2018-02-10 NOTE — Telephone Encounter (Signed)
Copied from [place] [id number]. Topic: Quick Communication - See Telephone Encounter >> Feb 10, 2018  9:32 AM Ivar Drape wrote: CRM for notification. See Telephone encounter for: 02/10/18. The patient would like to have his Caduet medication ordered and sent to the practice.

## 2018-02-14 ENCOUNTER — Telehealth: Payer: Self-pay | Admitting: Physician Assistant

## 2018-02-14 MED ORDER — SILDENAFIL CITRATE 50 MG PO TABS: ORAL_TABLET | ORAL | 1 refills | Status: DC

## 2018-02-14 NOTE — Telephone Encounter (Signed)
OK for PEC to give response

## 2018-02-14 NOTE — Telephone Encounter (Signed)
LMOVM advising patient rx for Viagra sent to [facility] was refilled thru patient assistance last week. Usually takes 7-10 days to come in. Will notify once medication samples come in.

## 2018-02-14 NOTE — Telephone Encounter (Signed)
Copied from [place] [phone]. Topic: Quick Communication - Rx Refill/Question >> Feb 14, 2018  2:00 PM Stovall, Shana A wrote: Medication: sildenafil (VIAGRA) 50 MG tablet [[id number]]- [facility]  amLODipine-atorvastatin (CADUET) 5-10 MG tablet [[id number]]- -pt has Patient assistance though [employer] with this med Best call back [phone]     Agent: Please be advised that RX refills may take up to 3 business days. We ask that you follow-up with your pharmacy.

## 2018-02-16 ENCOUNTER — Other Ambulatory Visit: Payer: Self-pay | Admitting: Physician Assistant

## 2018-02-16 DIAGNOSIS — E119 Type 2 diabetes mellitus without complications: Secondary | ICD-10-CM

## 2018-03-10 NOTE — Telephone Encounter (Signed)
Patient would like to know if this medication is in, please call and leave message if he does not answer

## 2018-03-10 NOTE — Telephone Encounter (Signed)
We have not received this medication as of yet.

## 2018-03-11 NOTE — Telephone Encounter (Signed)
In reviewing patient chart. His Patient assistance program is due for renewal. Expired 03/04/18. Will advise patient he is due to complete a new application to continue medication

## 2018-03-12 NOTE — Telephone Encounter (Signed)
Relation to pt: self  Call back number: [phone]    Reason for call:  Patient checking on the status of medication mentioned below. Informed patient regarding patient assistance program note and he would like a call back stating request for Rx was sent in prior to christmas, please advise

## 2018-03-12 NOTE — Telephone Encounter (Signed)
Spoke with [employer] and was advised that they did receive the rx for the Caduet in Dec but needed a new enrollment application to process.  Advised patient. He will complete paperwork and will get processed as soon as possible.   Patient also requesting a refill of the Januvia thru [employer] patient assistance.

## 2018-03-14 NOTE — Telephone Encounter (Signed)
Called [employer] to [employer] medication. Rx will take 7-10 days to arrive to the office. Patient will need to contact [employer] to update mailing address.  Faxed over updated Patient assitance program paperwork for Caduet.

## 2018-03-21 ENCOUNTER — Telehealth: Payer: Self-pay

## 2018-03-21 DIAGNOSIS — E119 Type 2 diabetes mellitus without complications: Secondary | ICD-10-CM

## 2018-03-21 DIAGNOSIS — I10 Essential (primary) hypertension: Secondary | ICD-10-CM

## 2018-03-21 DIAGNOSIS — E785 Hyperlipidemia, unspecified: Secondary | ICD-10-CM

## 2018-03-21 MED ORDER — AMLODIPINE-ATORVASTATIN 5-10 MG PO TABS: 1.0000 | ORAL_TABLET | Freq: Every day | ORAL | 4 refills | Status: DC

## 2018-03-21 NOTE — Addendum Note (Signed)
Addended by: Leonidas Romberg on: 03/21/2018 10:00 AM   Modules accepted: Orders

## 2018-03-21 NOTE — Telephone Encounter (Signed)
Copied from [place] #[id number]. Topic: General - Other >> Mar 21, 2018  8:56 AM Leward Quan A wrote: Reason for CRM: Patient called to speak with Eduard Clos states that he is off today and if he need to come in to the office please give him a call him back. Patient say that this is in reference to amLODipine-atorvastatin (CADUET) 5-10 MG tablet  and the application that was resubmitted. Please advise Ph# [phone]

## 2018-03-21 NOTE — Telephone Encounter (Addendum)
Relation to pt: self  Call back number: [phone]  Reason for call:  Patient states medication will be delieverd to the office on Monday 03/24/2018 and he will bring financial information at that time.

## 2018-03-21 NOTE — Telephone Encounter (Signed)
Received paperwork from [employer] for patient assistance. We need updated financial paperwork to send with application. Will advise patient

## 2018-03-21 NOTE — Telephone Encounter (Signed)
Spoke with patient and advised that we needed to send updated financials to [employer] patient assistance.  He agreed to call to verify finances with [employer] and update address with [employer].

## 2018-03-24 ENCOUNTER — Encounter: Payer: Self-pay | Admitting: Physician Assistant

## 2018-03-24 LAB — HM DIABETES EYE EXAM

## 2018-03-24 NOTE — Telephone Encounter (Signed)
Patient updated financials for Patient assistance. Paperwork updated and faxed to [employer] for approval. Waiting approval Patient did updated address with [employer] for his Januvia. Shipment should arrive 7-10 days.

## 2018-03-25 MED ORDER — SITAGLIPTIN PHOSPHATE 50 MG PO TABS: 50.0000 mg | ORAL_TABLET | Freq: Every day | ORAL | 0 refills | Status: DC

## 2018-03-25 NOTE — Addendum Note (Signed)
Addended by: Leonidas Romberg on: 03/25/2018 04:05 PM   Modules accepted: Orders

## 2018-03-27 NOTE — Telephone Encounter (Signed)
LMOVM advised patient that the Januvia is at the front desk for pick up.

## 2018-03-31 ENCOUNTER — Other Ambulatory Visit: Payer: Self-pay | Admitting: Emergency Medicine

## 2018-03-31 DIAGNOSIS — E785 Hyperlipidemia, unspecified: Secondary | ICD-10-CM

## 2018-03-31 DIAGNOSIS — I10 Essential (primary) hypertension: Secondary | ICD-10-CM

## 2018-03-31 MED ORDER — AMLODIPINE-ATORVASTATIN 5-10 MG PO TABS: 1.0000 | ORAL_TABLET | Freq: Every day | ORAL | 0 refills | Status: DC

## 2018-04-28 ENCOUNTER — Other Ambulatory Visit: Payer: Self-pay | Admitting: Physician Assistant

## 2018-05-15 ENCOUNTER — Telehealth: Payer: Self-pay | Admitting: Physician Assistant

## 2018-05-15 DIAGNOSIS — B351 Tinea unguium: Secondary | ICD-10-CM

## 2018-05-15 NOTE — Telephone Encounter (Signed)
Referral placed for Podiatry

## 2018-05-15 NOTE — Telephone Encounter (Signed)
Copied from [place] [id number]. Topic: General - Other >> May 15, 2018 12:58 PM Keene Breath wrote: Reason for CRM: Patient called to speak with the doctor's assistant.  He said he was calling regarding a referral for a foot doctor.  Please advise and call patient back at [phone]

## 2018-05-16 ENCOUNTER — Other Ambulatory Visit: Payer: Self-pay | Admitting: Physician Assistant

## 2018-05-16 MED ORDER — IBUPROFEN 600 MG PO TABS: 600.0000 mg | ORAL_TABLET | Freq: Three times a day (TID) | ORAL | 0 refills | Status: DC | PRN

## 2018-05-16 MED ORDER — ATORVASTATIN CALCIUM 20 MG PO TABS: 20.0000 mg | ORAL_TABLET | Freq: Every day | ORAL | 1 refills | Status: DC

## 2018-05-16 NOTE — Telephone Encounter (Signed)
Copied from [place]. Topic: Quick Communication - Rx Refill/Question >> May 16, 2018  9:22 AM Alanda Slim E wrote: Medication: ibuprofen (ADVIL,MOTRIN) 600 MG tablet atorvastatin (LIPITOR) 20 MG tablet   Has the patient contacted their pharmacy? No   Preferred Pharmacy (with phone number or street name): [facility], [place]. [phone] (Phone) [phone] (Fax)    Agent: Please be advised that RX refills may take up to 3 business days. We ask that you follow-up with your pharmacy.

## 2018-05-16 NOTE — Telephone Encounter (Signed)
Requested Prescriptions  Pending Prescriptions Disp Refills  . ibuprofen (ADVIL,MOTRIN) 600 MG tablet 90 tablet 0    Sig: Take 1 tablet (600 mg total) by mouth every 8 (eight) hours as needed.     Analgesics:  NSAIDS Passed - 05/16/2018  9:46 AM      Passed - Cr in normal range and within 360 days    Creat  Date Value Ref Range Status  07/24/2013 1.11 0.50 - 1.35 mg/dL Final   Creatinine, Ser  Date Value Ref Range Status  09/11/2017 1.01 0.40 - 1.50 mg/dL Final         Passed - HGB in normal range and within 360 days    Hemoglobin  Date Value Ref Range Status  09/11/2017 14.7 13.0 - 17.0 g/dL Final         Passed - Patient is not pregnant      Passed - Valid encounter within last 12 months    Recent Outpatient Visits          7 months ago Hyperlipidemia, unspecified hyperlipidemia type   [facility] [place], [place], [place]   8 months ago Visit for preventive health examination   [facility] [place], Luanna Cole, [place]   1 year ago Flu-like symptoms   [facility], [place], [place]   1 year ago Acute bacterial sinusitis   [facility] [place], Luanna Cole, [place]   1 year ago STD exposure   [facility] [place], [place], [place]           . atorvastatin (LIPITOR) 20 MG tablet 90 tablet 1    Sig: Take 1 tablet (20 mg total) by mouth daily.     Cardiovascular:  Antilipid - Statins Failed - 05/16/2018  9:46 AM      Failed - LDL in normal range and within 360 days    LDL Cholesterol  Date Value Ref Range Status  12/11/2017 104 (H) 0 - 99 mg/dL Final         Passed - Total Cholesterol in normal range and within 360 days    Cholesterol  Date Value Ref Range Status  12/11/2017 165 0 - 200 mg/dL Final    Comment:    ATP III Classification       Desirable:  < 200 mg/dL                Borderline High:  200 - 239 mg/dL          High:  > = 240 mg/dL         Passed - HDL in normal range and within 360 days    HDL  Date Value Ref Range Status  12/11/2017 44.60 >39.00 mg/dL Final         Passed - Triglycerides in normal range and within 360 days    Triglycerides  Date Value Ref Range Status  12/11/2017 82.0 0.0 - 149.0 mg/dL Final    Comment:    Normal:  <150 mg/dLBorderline High:  150 - 199 mg/dL         Passed - Patient is not pregnant      Passed - Valid encounter within last 12 months    Recent Outpatient Visits          7 months ago Hyperlipidemia, unspecified hyperlipidemia type   [place] [place], [place], [place]  8 months ago Visit for preventive health examination   [facility] [place], Luanna Cole, [place]   1 year ago Flu-like symptoms   [facility] [place], [place]   1 year ago Acute bacterial sinusitis   [facility] [place], Luanna Cole, [place]   1 year ago STD exposure   [facility], Luanna Cole, [place]

## 2018-05-19 ENCOUNTER — Other Ambulatory Visit: Payer: Self-pay

## 2018-05-19 ENCOUNTER — Encounter: Payer: Self-pay | Admitting: Podiatry

## 2018-05-19 ENCOUNTER — Ambulatory Visit (INDEPENDENT_AMBULATORY_CARE_PROVIDER_SITE_OTHER): Payer: Commercial Managed Care - PPO

## 2018-05-19 ENCOUNTER — Ambulatory Visit (INDEPENDENT_AMBULATORY_CARE_PROVIDER_SITE_OTHER): Payer: Commercial Managed Care - PPO | Admitting: Podiatry

## 2018-05-19 ENCOUNTER — Other Ambulatory Visit: Payer: Self-pay | Admitting: Podiatry

## 2018-05-19 VITALS — BP 131/86 | HR 80

## 2018-05-19 DIAGNOSIS — L03031 Cellulitis of right toe: Secondary | ICD-10-CM

## 2018-05-19 DIAGNOSIS — S90424A Blister (nonthermal), right lesser toe(s), initial encounter: Secondary | ICD-10-CM

## 2018-05-19 MED ORDER — GENTAMICIN SULFATE 0.1 % EX CREA: 1.0000 "application " | TOPICAL_CREAM | Freq: Two times a day (BID) | CUTANEOUS | 1 refills | Status: DC

## 2018-05-19 MED ORDER — DOXYCYCLINE HYCLATE 100 MG PO TABS: 100.0000 mg | ORAL_TABLET | Freq: Two times a day (BID) | ORAL | 0 refills | Status: DC

## 2018-05-21 NOTE — Progress Notes (Signed)
   HPI: 53 year old male presenting today as a new patient with a chief complaint of redness, pain and swelling of the right fourth toe that began approximately one week ago. He reports an associated blister on the toe. 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 He has not done anything for treatment. Working and driving the bus increases the pain. Patient is here for further evaluation and treatment.   Past Medical History:  Diagnosis Date  . Allergy   . DM (diabetes mellitus) ([place])    type 2  . High cholesterol   . Hypertension   . Low testosterone      Physical Exam: General: The patient is alert and oriented x3 in no acute distress.  Dermatology: Skin is warm, dry and supple bilateral lower extremities. Negative for open lesions or macerations.  Vascular: Palpable pedal pulses bilaterally. Capillary refill within normal limits.  Neurological: Epicritic and protective threshold grossly intact bilaterally.   Musculoskeletal Exam: Erythema and edema localized to the right fourth toe. Range of motion within normal limits to all pedal and ankle joints bilateral. Muscle strength 5/5 in all groups bilateral.   Radiographic Exam:  Normal osseous mineralization. Joint spaces preserved. No fracture/dislocation/boney destruction.    Assessment: 1. Right 4th toe cellulitis    Plan of Care:  1. Patient evaluated. X-Rays reviewed.  2. Prescription for Doxycycline #20 provided to patient.  3. Prescription for Gentamicin cream provided to patient to use daily with a bandage.  4. Recommended good shoe gear.  5. Return to clinic in 2 weeks.   Drives a city bus.      Edrick Kins, DPM [facility]  Dr. Edrick Kins, DPM    [address], [place] [address]                Office [phone]  Fax [phone]

## 2018-05-22 ENCOUNTER — Telehealth: Payer: Self-pay | Admitting: Emergency Medicine

## 2018-05-22 DIAGNOSIS — E119 Type 2 diabetes mellitus without complications: Secondary | ICD-10-CM

## 2018-05-22 NOTE — Telephone Encounter (Signed)
[name] refilled thru [employer] patient assistance on today. Shipment on 06/01/18 Reorder [phone]  Januvia refilled thru [employer] patient assistance on today. Shipment in 7-10 days   Copied from [place] [id number]. Topic: General - Other >> May 22, 2018 12:39 PM Carolyn Stare wrote: Pt call to ask if Elyn Aquas nurse will go ahead and order his below meds    amLODipine-atorvastatin (CADUET) 5-10 MG tablet   sitaGLIPtin (JANUVIA) 50 MG tablet

## 2018-05-28 ENCOUNTER — Telehealth: Payer: Self-pay | Admitting: Podiatry

## 2018-05-28 ENCOUNTER — Telehealth: Payer: Self-pay

## 2018-05-28 NOTE — Telephone Encounter (Signed)
Pt was prescribed a medication for his toenail fungus but states that the medication made him break out in hives. Pt stopped use of the medication but would like to know if there is something else that can be prescribed.

## 2018-05-28 NOTE — Telephone Encounter (Signed)
Pt called stating the medication given for his toe was breaking him out. Was trying to call pt back (call was unsuccessful) to inform him to discontinue doxycycline until next appointment but to continue to use the gentamicin cream as directed; per Dr. Amalia Hailey.

## 2018-05-29 NOTE — Telephone Encounter (Signed)
Left message informing pt he should stop the doxycycline by mouth and continue applying the gentamicin cream.

## 2018-05-29 NOTE — Addendum Note (Signed)
Addended by: Harriett Sine D on: 05/29/2018 02:02 PM   Modules accepted: Orders

## 2018-05-29 NOTE — Telephone Encounter (Signed)
Please call patient to advise what to do before appointment on Monday.

## 2018-06-02 ENCOUNTER — Encounter: Payer: Self-pay | Admitting: Podiatry

## 2018-06-02 ENCOUNTER — Ambulatory Visit: Payer: Commercial Managed Care - PPO | Admitting: Podiatry

## 2018-06-02 ENCOUNTER — Ambulatory Visit (INDEPENDENT_AMBULATORY_CARE_PROVIDER_SITE_OTHER): Payer: Commercial Managed Care - PPO | Admitting: Podiatry

## 2018-06-02 ENCOUNTER — Other Ambulatory Visit: Payer: Self-pay

## 2018-06-02 VITALS — Temp 98.4°F | Resp 16

## 2018-06-02 DIAGNOSIS — L03031 Cellulitis of right toe: Secondary | ICD-10-CM | POA: Diagnosis not present

## 2018-06-02 DIAGNOSIS — E0843 Diabetes mellitus due to underlying condition with diabetic autonomic (poly)neuropathy: Secondary | ICD-10-CM | POA: Diagnosis not present

## 2018-06-02 NOTE — Progress Notes (Signed)
   HPI: 53 year old male T2DM-uncomplicated presents today to the office for follow-up evaluation regarding cellulitis to the right fourth toe.  Patient states that he took 3 days of oral doxycycline antibiotic as prescribed on 05/19/2018 and he developed hives.  Patient discontinued the antibiotic.  He states that the toe looks significantly better.  He denies any redness swelling or drainage.  He has been applying Neosporin to the toe site.  Past Medical History:  Diagnosis Date  . Allergy   . DM (diabetes mellitus) ([place])    type 2  . High cholesterol   . Hypertension   . Low testosterone      Physical Exam: General: The patient is alert and oriented x3 in no acute distress.  Dermatology: Skin is warm, dry and supple bilateral lower extremities. Negative for open lesions or macerations.   Vascular: Palpable pedal pulses bilaterally. No edema or erythema noted.  The erythema with edema to the right fourth toe appears to be resolved.  Capillary refill within normal limits.  Neurological: Epicritic and protective threshold grossly intact bilaterally.   Musculoskeletal Exam: Range of motion within normal limits to all pedal and ankle joints bilateral. Muscle strength 5/5 in all groups bilateral.    Assessment: 1.  Right fourth toe cellulitis-resolved   Plan of Care:  1. Patient evaluated.  2.  Recommend good foot hygiene and wearing good supportive shoes. 3.  Patient to return to work full duty no restrictions. 4.  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 Edrick Kins, DPM    [address], [place] [address]                Office [phone]  Fax [phone]

## 2018-06-04 ENCOUNTER — Telehealth: Payer: Self-pay | Admitting: Emergency Medicine

## 2018-06-04 ENCOUNTER — Encounter: Payer: Self-pay | Admitting: Physician Assistant

## 2018-06-04 MED ORDER — SITAGLIPTIN PHOSPHATE 50 MG PO TABS: 50.0000 mg | ORAL_TABLET | Freq: Every day | ORAL | 0 refills | Status: DC

## 2018-06-04 NOTE — Telephone Encounter (Signed)
Glucose elevation is likely secondary to increased cortisol responding to his stress levels. Have him hydrated and minimize carbs, keeping a close eye on things. If not improving we can temporarily tweak his regimen. Same thing with BP -- make sure he is checking after sitting and relaxing for 5-10 minutes. Also see about timing of his medication.

## 2018-06-04 NOTE — Telephone Encounter (Signed)
Limit Gatorade for now since he is not having symptoms that would cause dehydration (vomiting, diarrhea). Gatorade has sugar in it and electrolytes which can raise BP. I would stop this for a few days, focusing on water itself. Continue current medication for now. Limit salt from foods as well. Keep check on glucose and BP. If not calming back down some by Friday or if still climbing, call me.

## 2018-06-04 NOTE — Telephone Encounter (Signed)
Advised patient of PCP recommendations. He is agreeable. Will notify us if readings worsens

## 2018-06-04 NOTE — Telephone Encounter (Signed)
Patient called in and states when returning back to work he was advised due to his elevated blood pressure from the stress of the Covid 19. They recommend he self quarantine for 2 weeks. 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

## 2018-06-04 NOTE — Telephone Encounter (Signed)
Letter written for patient. Ready for pickup.

## 2018-06-04 NOTE — Telephone Encounter (Signed)
Januvia arrived to the office 06/03/18.  Medication list updated

## 2018-06-04 NOTE — Telephone Encounter (Signed)
Advised patient of PCP recommendations. Patient states he is very stressed at work. 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 They are not allowed to wear mask on the bus.  Patient states he is increasing hydration (water, Gatorade and hot tea)  He is willing to increase medications to lower glucose levels and blood pressure.

## 2018-06-04 NOTE — Telephone Encounter (Signed)
Copied from [place] [phone]. Topic: General - Other >> Jun 04, 2018 10:37 AM Edwin [name], NT wrote: Reason for CRM:  Patient called in stating he has been having elevated sugar and blood pressure readings. Sugar as high as 267. Blood pressure 148/92. No headache, shortness of breath or dizziness at this time.Patient's "normal" fasting blood sugar is 120-140, however, today it was 197. Stress levels have increased due to going into work at this time. 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 Requesting a call back at [phone] for further advise.

## 2018-06-04 NOTE — Addendum Note (Signed)
Addended by: Leonidas Romberg on: 06/04/2018 02:39 PM   Modules accepted: Orders

## 2018-06-17 ENCOUNTER — Encounter: Payer: Self-pay | Admitting: Physician Assistant

## 2018-07-08 ENCOUNTER — Encounter: Payer: Self-pay | Admitting: Physician Assistant

## 2018-07-08 ENCOUNTER — Other Ambulatory Visit: Payer: Self-pay

## 2018-07-08 ENCOUNTER — Ambulatory Visit (INDEPENDENT_AMBULATORY_CARE_PROVIDER_SITE_OTHER): Payer: Commercial Managed Care - PPO | Admitting: Physician Assistant

## 2018-07-08 VITALS — BP 132/75 | HR 75 | Temp 96.2°F | Ht 71.0 in | Wt 298.0 lb

## 2018-07-08 DIAGNOSIS — K219 Gastro-esophageal reflux disease without esophagitis: Secondary | ICD-10-CM | POA: Diagnosis not present

## 2018-07-08 MED ORDER — PANTOPRAZOLE SODIUM 40 MG PO TBEC: 40.0000 mg | DELAYED_RELEASE_TABLET | Freq: Every day | ORAL | 0 refills | Status: DC

## 2018-07-08 NOTE — Progress Notes (Signed)
Virtual Visit via Video   I connected with patient on 07/08/18 at 11:00 AM EDT by a video enabled telemedicine application and verified that I am speaking with the correct person using two identifiers.  Location patient: Home Location provider: [facility], Office Persons participating in the virtual visit: Patient, Provider, [place] (Patina Moore)  I discussed the limitations of evaluation and management by telemedicine and the availability of in person appointments. The patient expressed understanding and agreed to proceed.  Subjective:   HPI:   Patient presents via [facility]Me today c/o 1 week of nighttime heartburn, nausea and globus. Denies cough, fever, chills, abdominal pain. Denies change to bowel habits -- diarrhea, melena, hematochezia or tenesmus. Denies change in diet. Does not eat dinner until 7-8 but does not lay down until 11 PM. Has started drinking hot water with lemon and oranges in the evening after dinner.   ROS:   See pertinent positives and negatives per HPI.  Patient Active Problem List   Diagnosis Date Noted  . Seborrheic keratosis 04/15/2017  . PVC's (premature ventricular contractions) 09/03/2014  . Essential hypertension, benign 11/03/2013  . Diabetes mellitus type II, controlled ([place]) 08/03/2013  . Hyperlipidemia 08/03/2013  . Low serum testosterone level 08/03/2013  . Prostate cancer screening 08/03/2013  . Visit for preventive health examination 08/03/2013    Social History   Tobacco Use  . Smoking status: Former Smoker    Packs/day: 0.50    Years: 15.00    Pack years: 7.50    Last attempt to quit: 04/17/2005    Years since quitting: 13.2  . Smokeless tobacco: Never Used  Substance Use Topics  . Alcohol use: No    Current Outpatient Medications:  .  amLODipine-atorvastatin (CADUET) 5-10 MG tablet, Take 1 tablet by mouth daily., Disp: 90 tablet, Rfl: 0 .  aspirin EC 81 MG tablet, Take 1 tablet by mouth daily., Disp: , Rfl:  .   atorvastatin (LIPITOR) 20 MG tablet, Take 1 tablet (20 mg total) by mouth daily., Disp: 90 tablet, Rfl: 1 .  Blood Glucose Monitoring Suppl (ONETOUCH VERIO FLEX SYSTEM) w/Device KIT, 1 Units by Does not apply route daily. Dx:E11.9, Disp: 1 kit, Rfl: 0 .  fluticasone (FLONASE) 50 MCG/ACT nasal spray, Place 2 sprays into both nostrils daily as needed for allergies or rhinitis., Disp: 16 g, Rfl: 6 .  glipiZIDE (GLUCOTROL XL) 5 MG 24 hr tablet, TAKE 1 TABLET BY MOUTH ONCE DAILY WITH BREAKFAST, Disp: 90 tablet, Rfl: 1 .  glucose blood test strip, Use as instructed daily Dx: E11.9, Disp: 100 each, Rfl: 3 .  ibuprofen (ADVIL,MOTRIN) 600 MG tablet, Take 1 tablet (600 mg total) by mouth every 8 (eight) hours as needed., Disp: 90 tablet, Rfl: 0 .  metFORMIN (GLUCOPHAGE) 1000 MG tablet, TAKE 1 TABLET BY MOUTH TWICE DAILY WITH A MEAL, Disp: 180 tablet, Rfl: 0 .  multivitamin (ONE-A-DAY MEN'S) TABS tablet, Take 1 tablet by mouth daily., Disp: , Rfl:  .  ONETOUCH DELICA LANCETS 10U MISC, 1 Stick by Does not apply route daily. Dx:E11.9, Disp: 100 each, Rfl: 3 .  sildenafil (VIAGRA) 50 MG tablet, TAKE 2 TABLETS BY MOUTH DAILY AS NEEDED FOR ERECTILE DYSFUNCTION., Disp: 4 tablet, Rfl: 2 .  sitaGLIPtin (JANUVIA) 50 MG tablet, Take 1 tablet (50 mg total) by mouth daily., Disp: 90 tablet, Rfl: 0 .  triamcinolone cream (KENALOG) 0.1 %, Apply 1 application topically 2 (two) times daily., Disp: 30 g, Rfl: 0  Allergies  Allergen Reactions  .  Avelox [Moxifloxacin Hcl In Nacl] Hives and Itching  . Doxycycline Rash    Objective:   There were no vitals taken for this visit.  Patient is well-developed, well-nourished in no acute distress.  Resting comfortably at home.  Head is normocephalic, atraumatic.  No labored breathing.  Speech is clear and coherent with logical contest.  Patient is alert and oriented at baseline.   Assessment and Plan:   1. Gastroesophageal reflux disease without esophagitis Nighttime  symptoms only. Suspect recent use of hot water with citric fruits at night is the main culprit. He is to stop this. GERD diet to be followed. Supportive measures reviewed. Will start 2 week trial of Protonix. Follow-up scheduled.  - pantoprazole (PROTONIX) 40 MG tablet; Take 1 tablet (40 mg total) by mouth daily.  Dispense: 30 tablet; Refill: 0    Leeanne Rio, [place] 07/08/2018

## 2018-07-08 NOTE — Patient Instructions (Signed)
Instructions sent to MyChart.   Please start the Pantoprazole once daily (in the morning) for 2 weeks. Cut out the nighttime water with lemon and oranges as citric fruits are triggers for reflux. Ok to do a small amount in the morning.  Avoid late night eating. Continue to elevate the head of your bed. Review dietary recommendations below. Follow-up with me in 2 week.    Food Choices for Gastroesophageal Reflux Disease, Adult When you have gastroesophageal reflux disease (GERD), the foods you eat and your eating habits are very important. Choosing the right foods can help ease your discomfort. Think about working with a nutrition specialist (dietitian) to help you make good choices. What are tips for following this plan?  Meals  Choose healthy foods that are low in fat, such as fruits, vegetables, whole grains, low-fat dairy products, and lean meat, fish, and poultry.  Eat small meals often instead of 3 large meals a day. Eat your meals slowly, and in a place where you are relaxed. Avoid bending over or lying down until 2-3 hours after eating.  Avoid eating meals 2-3 hours before bed.  Avoid drinking a lot of liquid with meals.  Cook foods using methods other than frying. Bake, grill, or broil food instead.  Avoid or limit: ? Chocolate. ? Peppermint or spearmint. ? Alcohol. ? Pepper. ? Black and decaffeinated coffee. ? Black and decaffeinated tea. ? Bubbly (carbonated) soft drinks. ? Caffeinated energy drinks and soft drinks.  Limit high-fat foods such as: ? Fatty meat or fried foods. ? Whole milk, cream, butter, or ice cream. ? Nuts and nut butters. ? Pastries, donuts, and sweets made with butter or shortening.  Avoid foods that cause symptoms. These foods may be different for everyone. Common foods that cause symptoms include: ? Tomatoes. ? Oranges, lemons, and limes. ? Peppers. ? Spicy food. ? Onions and garlic. ? Vinegar. Lifestyle  Maintain a healthy weight.  Ask your doctor what weight is healthy for you. If you need to lose weight, work with your doctor to do so safely.  Exercise for at least 30 minutes for 5 or more days each week, or as told by your doctor.  Wear loose-fitting clothes.  Do not smoke. If you need help quitting, ask your doctor.  Sleep with the head of your bed higher than your feet. Use a wedge under the mattress or blocks under the bed frame to raise the head of the bed. Summary  When you have gastroesophageal reflux disease (GERD), food and lifestyle choices are very important in easing your symptoms.  Eat small meals often instead of 3 large meals a day. Eat your meals slowly, and in a place where you are relaxed.  Limit high-fat foods such as fatty meat or fried foods.  Avoid bending over or lying down until 2-3 hours after eating.  Avoid peppermint and spearmint, caffeine, alcohol, and chocolate. This information is not intended to replace advice given to you by your health care provider. Make sure you discuss any questions you have with your health care provider. Document Released: 08/21/2011 Document Revised: 03/27/2016 Document Reviewed: 03/27/2016 Elsevier Interactive Patient Education  2019 [employer].

## 2018-07-08 NOTE — Progress Notes (Signed)
I have discussed the procedure for the virtual visit with the patient who has given consent to proceed with assessment and treatment.   Haleemah Buckalew S Haydyn Liddell, CMA     

## 2018-07-29 ENCOUNTER — Encounter: Payer: Self-pay | Admitting: Physician Assistant

## 2018-07-29 ENCOUNTER — Ambulatory Visit (INDEPENDENT_AMBULATORY_CARE_PROVIDER_SITE_OTHER): Payer: Commercial Managed Care - PPO | Admitting: Physician Assistant

## 2018-07-29 ENCOUNTER — Other Ambulatory Visit: Payer: Self-pay

## 2018-07-29 VITALS — BP 126/74 | HR 84 | Wt 301.0 lb

## 2018-07-29 DIAGNOSIS — M79621 Pain in right upper arm: Secondary | ICD-10-CM | POA: Diagnosis not present

## 2018-07-29 DIAGNOSIS — E119 Type 2 diabetes mellitus without complications: Secondary | ICD-10-CM

## 2018-07-29 NOTE — Progress Notes (Signed)
Virtual Visit via Video   I connected with patient on 07/29/18 at 10:20 AM EDT by a video enabled telemedicine application and verified that I am speaking with the correct person using two identifiers.  Location patient: Home Location provider: [facility], Office Persons participating in the virtual visit: Patient, Provider, [place] (Patina Moore)  I discussed the limitations of evaluation and management by telemedicine and the availability of in person appointments. The patient expressed understanding and agreed to proceed.  Subjective:   HPI:   Patient presents today via doxy.me for assessment of acute concern.  He is also overdue for diabetic follow-up.  Patient endorses 2 weeks of occasional discomfort in right axillary region.  Notes this is very infrequent but has been slightly more common over the past few days.  Denies any known trauma or injury.  Discomfort is noted as a nagging dull pain in mid right axilla, lasting only a few seconds before resolving on its own.  Patient denies any noted skin changes, swelling, or mass in the area.  States that sometimes happens while he is laying on his arm at night.  Patient states he initially thought it may be an ingrown hair even though he did not notice any redness or pustules.  As such he shaved his armpit without any change in symptoms.  Denies similar symptoms of left arm.  Patient denies any right upper extremity numbness, tingling or weakness.  In regards to patient's diabetes, patient endorses taking his medications as directed.  Is currently on a regimen of Metformin 1000 mg BID, Glipizide XL 33m once daily.  Notes fasting sugars averaging 1 10-1 40s.  Is trying to watch diet.  Is active with work but denies exercise regimen at present.  Is due for repeat A1c, microalbumin.  Up-to-date on foot exam, eye exam and influenza vaccine.  Denies current numbness or tingling in hands or feet.  Denies vision change or change to urinary  habits.  ROS:   See pertinent positives and negatives per HPI.  Patient Active Problem List   Diagnosis Date Noted  . Seborrheic keratosis 04/15/2017  . PVC's (premature ventricular contractions) 09/03/2014  . Essential hypertension, benign 11/03/2013  . Diabetes mellitus type II, controlled ([place] 08/03/2013  . Hyperlipidemia 08/03/2013  . Low serum testosterone level 08/03/2013  . Prostate cancer screening 08/03/2013  . Visit for preventive health examination 08/03/2013    Social History   Tobacco Use  . Smoking status: Former Smoker    Packs/day: 0.50    Years: 15.00    Pack years: 7.50    Last attempt to quit: 04/17/2005    Years since quitting: 13.2  . Smokeless tobacco: Never Used  Substance Use Topics  . Alcohol use: No    Current Outpatient Medications:  .  amLODipine-atorvastatin (CADUET) 5-10 MG tablet, Take 1 tablet by mouth daily., Disp: 90 tablet, Rfl: 0 .  aspirin EC 81 MG tablet, Take 1 tablet by mouth daily., Disp: , Rfl:  .  atorvastatin (LIPITOR) 20 MG tablet, Take 1 tablet (20 mg total) by mouth daily., Disp: 90 tablet, Rfl: 1 .  Blood Glucose Monitoring Suppl (ONETOUCH VERIO FLEX SYSTEM) w/Device KIT, 1 Units by Does not apply route daily. Dx:E11.9, Disp: 1 kit, Rfl: 0 .  fluticasone (FLONASE) 50 MCG/ACT nasal spray, Place 2 sprays into both nostrils daily as needed for allergies or rhinitis., Disp: 16 g, Rfl: 6 .  glipiZIDE (GLUCOTROL XL) 5 MG 24 hr tablet, TAKE 1 TABLET BY MOUTH  ONCE DAILY WITH BREAKFAST, Disp: 90 tablet, Rfl: 1 .  glucose blood test strip, Use as instructed daily Dx: E11.9, Disp: 100 each, Rfl: 3 .  ibuprofen (ADVIL,MOTRIN) 600 MG tablet, Take 1 tablet (600 mg total) by mouth every 8 (eight) hours as needed., Disp: 90 tablet, Rfl: 0 .  metFORMIN (GLUCOPHAGE) 1000 MG tablet, TAKE 1 TABLET BY MOUTH TWICE DAILY WITH A MEAL, Disp: 180 tablet, Rfl: 0 .  multivitamin (ONE-A-DAY MEN'S) TABS tablet, Take 1 tablet by mouth daily., Disp: , Rfl:  .   ONETOUCH DELICA LANCETS 45Y MISC, 1 Stick by Does not apply route daily. Dx:E11.9, Disp: 100 each, Rfl: 3 .  pantoprazole (PROTONIX) 40 MG tablet, Take 1 tablet (40 mg total) by mouth daily., Disp: 30 tablet, Rfl: 0 .  sitaGLIPtin (JANUVIA) 50 MG tablet, Take 1 tablet (50 mg total) by mouth daily., Disp: 90 tablet, Rfl: 0 .  triamcinolone cream (KENALOG) 0.1 %, Apply 1 application topically 2 (two) times daily., Disp: 30 g, Rfl: 0 .  sildenafil (VIAGRA) 50 MG tablet, TAKE 2 TABLETS BY MOUTH DAILY AS NEEDED FOR ERECTILE DYSFUNCTION., Disp: 4 tablet, Rfl: 0  Allergies  Allergen Reactions  . Avelox [Moxifloxacin Hcl In Nacl] Hives and Itching  . Doxycycline Rash    Objective:   BP 126/74   Pulse 84   Wt (!) 301 lb (136.5 kg)   BMI 41.98 kg/m   Patient is well-developed, well-nourished in no acute distress.  Resting comfortably on bed at home.  Head is normocephalic, atraumatic.  No labored breathing.  Speech is clear and coherent with logical content.  Patient is alert and oriented at baseline.  R axillary region visualized on camera. No noted erythema, bruising. No visible mass but exam is significantly limited. ROM of shoulder normal. Palpation from patient in area of concern does not reproduce pain.  Assessment and Plan:   1. Axillary pain, right Unclear etiology. Lasting < 1 min, usually a few seconds. Could be irritated cutaneous nerve or something deeper. Supportive measures reviewed to try at home. He has labs scheduled for his DM. Will examine arm in person at that time if symptoms are not resolving.   2. Controlled type 2 diabetes mellitus without complication, without long-term current use of insulin ([place]) Taking medications as directed. Dietary and exercise recommendations again reviewed. Lab orders placed. Lab appt scheduled.  - Urine Microalbumin w/creat. ratio; Future - Hemoglobin A1c; Future - Basic metabolic panel; Future    Leeanne Rio, PA-C 07/30/2018

## 2018-07-29 NOTE — Progress Notes (Signed)
I have discussed the procedure for the virtual visit with the patient who has given consent to proceed with assessment and treatment.   Victoria Henshaw S Abdirahim Flavell, CMA     

## 2018-07-29 NOTE — Progress Notes (Deleted)
Virtual Visit via Video   I connected with patient on 07/29/18 at 10:20 AM EDT by a video enabled telemedicine application and verified that I am speaking with the correct person using two identifiers.  Location patient: Home Location provider: [facility], [facility] Persons participating in the virtual visit: Patient, Provider, [place] (Patina Moore)  I discussed the limitations of evaluation and management by telemedicine and the availability of in person appointments. The patient expressed understanding and agreed to proceed.  Subjective:   HPI:   Patient presents today via doxy.me for assessment of acute concern.  He is also overdue for diabetic follow-up.  Patient endorses 2 weeks of occasional discomfort in right axillary region.  Notes this is very infrequent but has been slightly more common over the past few days.  Denies any known trauma or injury.  Discomfort is noted as a nagging dull pain in mid right axilla, lasting only a few seconds before resolving on its own.  Patient denies any noted skin changes, swelling, or mass in the area.  States that sometimes happens while he is laying on his arm at night.  Patient states he initially thought it may be an ingrown hair even though he did not notice any redness or pustules.  As such he shaved his armpit without any change in symptoms.  Denies similar symptoms of left arm.  Patient denies any right upper extremity numbness, tingling or weakness.  In regards to patient's diabetes, patient endorses taking his medications as directed.  Is currently on a regimen of ***.  Notes fasting sugars averaging 1 10-1 40s.  Is trying to watch diet.  Is active with work but denies exercise regimen at present.  Is due for repeat A1c, microalbumin.  Up-to-date on foot exam, eye exam and influenza vaccine.  Denies current numbness or tingling in hands or feet.  Denies vision change or change to urinary habits.  ROS:   See pertinent positives and  negatives per HPI.  Patient Active Problem List   Diagnosis Date Noted  . Seborrheic keratosis 04/15/2017  . PVC's (premature ventricular contractions) 09/03/2014  . Essential hypertension, benign 11/03/2013  . Diabetes mellitus type II, controlled ([place]) 08/03/2013  . Hyperlipidemia 08/03/2013  . Low serum testosterone level 08/03/2013  . Prostate cancer screening 08/03/2013  . Visit for preventive health examination 08/03/2013    Social History   Tobacco Use  . Smoking status: Former Smoker    Packs/day: 0.50    Years: 15.00    Pack years: 7.50    Last attempt to quit: 04/17/2005    Years since quitting: 13.2  . Smokeless tobacco: Never Used  Substance Use Topics  . Alcohol use: No    Current Outpatient Medications:  .  amLODipine-atorvastatin (CADUET) 5-10 MG tablet, Take 1 tablet by mouth daily., Disp: 90 tablet, Rfl: 0 .  aspirin EC 81 MG tablet, Take 1 tablet by mouth daily., Disp: , Rfl:  .  atorvastatin (LIPITOR) 20 MG tablet, Take 1 tablet (20 mg total) by mouth daily., Disp: 90 tablet, Rfl: 1 .  Blood Glucose Monitoring Suppl (ONETOUCH VERIO FLEX SYSTEM) w/Device KIT, 1 Units by Does not apply route daily. Dx:E11.9, Disp: 1 kit, Rfl: 0 .  fluticasone (FLONASE) 50 MCG/ACT nasal spray, Place 2 sprays into both nostrils daily as needed for allergies or rhinitis., Disp: 16 g, Rfl: 6 .  glipiZIDE (GLUCOTROL XL) 5 MG 24 hr tablet, TAKE 1 TABLET BY MOUTH ONCE DAILY WITH BREAKFAST, Disp: 90 tablet, Rfl:  1 .  glucose blood test strip, Use as instructed daily Dx: E11.9, Disp: 100 each, Rfl: 3 .  ibuprofen (ADVIL,MOTRIN) 600 MG tablet, Take 1 tablet (600 mg total) by mouth every 8 (eight) hours as needed., Disp: 90 tablet, Rfl: 0 .  metFORMIN (GLUCOPHAGE) 1000 MG tablet, TAKE 1 TABLET BY MOUTH TWICE DAILY WITH A MEAL, Disp: 180 tablet, Rfl: 0 .  multivitamin (ONE-A-DAY MEN'S) TABS tablet, Take 1 tablet by mouth daily., Disp: , Rfl:  .  ONETOUCH DELICA LANCETS 57I MISC, 1 Stick by  Does not apply route daily. Dx:E11.9, Disp: 100 each, Rfl: 3 .  pantoprazole (PROTONIX) 40 MG tablet, Take 1 tablet (40 mg total) by mouth daily., Disp: 30 tablet, Rfl: 0 .  sildenafil (VIAGRA) 50 MG tablet, TAKE 2 TABLETS BY MOUTH DAILY AS NEEDED FOR ERECTILE DYSFUNCTION., Disp: 4 tablet, Rfl: 2 .  sitaGLIPtin (JANUVIA) 50 MG tablet, Take 1 tablet (50 mg total) by mouth daily., Disp: 90 tablet, Rfl: 0 .  triamcinolone cream (KENALOG) 0.1 %, Apply 1 application topically 2 (two) times daily., Disp: 30 g, Rfl: 0  Allergies  Allergen Reactions  . Avelox [Moxifloxacin Hcl In Nacl] Hives and Itching  . Doxycycline Rash    Objective:   BP (!) 146/82   Pulse 84   Wt (!) 301 lb (136.5 kg)   BMI 41.98 kg/m   Patient is well-developed, well-nourished in no acute distress.  Resting comfortably on bed at home.  Head is normocephalic, atraumatic.  No labored breathing.  Speech is clear and coherent with logical content.  Patient is alert and oriented at baseline.  ***  Assessment and Plan:        ***.   Leeanne Rio, PA-C 07/29/2018

## 2018-07-30 ENCOUNTER — Other Ambulatory Visit: Payer: Self-pay | Admitting: Physician Assistant

## 2018-08-01 ENCOUNTER — Other Ambulatory Visit: Payer: Self-pay | Admitting: Physician Assistant

## 2018-08-01 DIAGNOSIS — E119 Type 2 diabetes mellitus without complications: Secondary | ICD-10-CM

## 2018-08-04 ENCOUNTER — Other Ambulatory Visit: Payer: Self-pay

## 2018-08-04 ENCOUNTER — Ambulatory Visit (INDEPENDENT_AMBULATORY_CARE_PROVIDER_SITE_OTHER): Payer: Commercial Managed Care - PPO

## 2018-08-04 DIAGNOSIS — E119 Type 2 diabetes mellitus without complications: Secondary | ICD-10-CM | POA: Diagnosis not present

## 2018-08-04 LAB — MICROALBUMIN / CREATININE URINE RATIO
Creatinine,U: 77.5 mg/dL
Microalb Creat Ratio: 0.9 mg/g (ref 0.0–30.0)
Microalb, Ur: <0.7 mg/dL (ref 0.0–1.9)

## 2018-08-04 LAB — BASIC METABOLIC PANEL
BUN: 12 mg/dL (ref 6–23)
CO2: 29 mEq/L (ref 19–32)
Calcium: 9.4 mg/dL (ref 8.4–10.5)
Chloride: 102 mEq/L (ref 96–112)
Creatinine, Ser: 0.93 mg/dL (ref 0.40–1.50)
GFR: 102.91 mL/min (ref >60.00)
Glucose, Bld: 173 mg/dL — ABNORMAL HIGH (ref 70–99)
Potassium: 4.1 mEq/L (ref 3.5–5.1)
Sodium: 140 mEq/L (ref 135–145)

## 2018-08-04 LAB — HEMOGLOBIN A1C: Hgb A1c MFr Bld: 9 % — ABNORMAL HIGH (ref 4.6–6.5)

## 2018-08-06 ENCOUNTER — Telehealth: Payer: Self-pay | Admitting: Physician Assistant

## 2018-08-06 ENCOUNTER — Other Ambulatory Visit: Payer: Self-pay | Admitting: Emergency Medicine

## 2018-08-06 DIAGNOSIS — E119 Type 2 diabetes mellitus without complications: Secondary | ICD-10-CM

## 2018-08-06 MED ORDER — SITAGLIPTIN PHOSPHATE 100 MG PO TABS: 100.0000 mg | ORAL_TABLET | Freq: Every day | ORAL | 0 refills | Status: DC

## 2018-08-06 NOTE — Telephone Encounter (Signed)
Patient contacted of lab results. See other note

## 2018-08-06 NOTE — Telephone Encounter (Signed)
Please call the pt to go over results    Copied from [place] [id number]. Topic: General - Inquiry >> Aug 06, 2018 12:27 PM Mathis Bud wrote: Reason for CRM: Patient is requesting for nurse or pcp to go over lab results again, patient states he is confused Call back # [phone]

## 2018-08-16 ENCOUNTER — Inpatient Hospital Stay (HOSPITAL_BASED_OUTPATIENT_CLINIC_OR_DEPARTMENT_OTHER)
Admission: EM | Admit: 2018-08-16 | Discharge: 2018-08-21 | DRG: 177 | Disposition: A | Discharge status: Home / Self Care | Payer: Commercial Managed Care - PPO | Attending: Internal Medicine | Admitting: Internal Medicine

## 2018-08-16 ENCOUNTER — Emergency Department (HOSPITAL_BASED_OUTPATIENT_CLINIC_OR_DEPARTMENT_OTHER): Payer: Commercial Managed Care - PPO

## 2018-08-16 ENCOUNTER — Other Ambulatory Visit: Payer: Self-pay

## 2018-08-16 ENCOUNTER — Encounter (HOSPITAL_BASED_OUTPATIENT_CLINIC_OR_DEPARTMENT_OTHER): Payer: Self-pay | Admitting: *Deleted

## 2018-08-16 DIAGNOSIS — Z7982 Long term (current) use of aspirin: Secondary | ICD-10-CM

## 2018-08-16 DIAGNOSIS — E875 Hyperkalemia: Secondary | ICD-10-CM | POA: Diagnosis not present

## 2018-08-16 DIAGNOSIS — E785 Hyperlipidemia, unspecified: Secondary | ICD-10-CM | POA: Diagnosis present

## 2018-08-16 DIAGNOSIS — U071 COVID-19: Secondary | ICD-10-CM

## 2018-08-16 DIAGNOSIS — Z8042 Family history of malignant neoplasm of prostate: Secondary | ICD-10-CM

## 2018-08-16 DIAGNOSIS — Z87891 Personal history of nicotine dependence: Secondary | ICD-10-CM

## 2018-08-16 DIAGNOSIS — J9601 Acute respiratory failure with hypoxia: Secondary | ICD-10-CM | POA: Diagnosis present

## 2018-08-16 DIAGNOSIS — E119 Type 2 diabetes mellitus without complications: Secondary | ICD-10-CM | POA: Diagnosis present

## 2018-08-16 DIAGNOSIS — Z7951 Long term (current) use of inhaled steroids: Secondary | ICD-10-CM

## 2018-08-16 DIAGNOSIS — E7800 Pure hypercholesterolemia, unspecified: Secondary | ICD-10-CM | POA: Diagnosis present

## 2018-08-16 DIAGNOSIS — E669 Obesity, unspecified: Secondary | ICD-10-CM | POA: Diagnosis present

## 2018-08-16 DIAGNOSIS — Z881 Allergy status to other antibiotic agents status: Secondary | ICD-10-CM

## 2018-08-16 DIAGNOSIS — Z7984 Long term (current) use of oral hypoglycemic drugs: Secondary | ICD-10-CM

## 2018-08-16 DIAGNOSIS — I10 Essential (primary) hypertension: Secondary | ICD-10-CM | POA: Diagnosis present

## 2018-08-16 DIAGNOSIS — J1282 Pneumonia due to coronavirus disease 2019: Secondary | ICD-10-CM | POA: Diagnosis present

## 2018-08-16 DIAGNOSIS — J1289 Other viral pneumonia: Secondary | ICD-10-CM | POA: Diagnosis present

## 2018-08-16 DIAGNOSIS — Z6841 Body Mass Index (BMI) 40.0 and over, adult: Secondary | ICD-10-CM

## 2018-08-16 DIAGNOSIS — K219 Gastro-esophageal reflux disease without esophagitis: Secondary | ICD-10-CM | POA: Diagnosis present

## 2018-08-16 NOTE — Discharge Instructions (Addendum)
Person Under Monitoring Name: Edwin [name]  Location: [place] [address]   Infection Prevention Recommendations for Individuals Confirmed to have, or Being Evaluated for, 2019 Novel Coronavirus (COVID-19) Infection Who Receive Care at Home  Individuals who are confirmed to have, or are being evaluated for, COVID-19 should follow the prevention steps below until a healthcare provider or local or state health department says they can return to normal activities.  Stay home except to get medical care You should restrict activities outside your home, except for getting medical care. Do not go to work, school, or public areas, and do not use public transportation or taxis.  Call ahead before visiting your doctor Before your medical appointment, call the healthcare provider and tell them that you have, or are being evaluated for, COVID-19 infection. This will help the healthcare providers office take steps to keep other people from getting infected. Ask your healthcare provider to call the local or state health department.  Monitor your symptoms Seek prompt medical attention if your illness is worsening (e.g., difficulty breathing). Before going to your medical appointment, call the healthcare provider and tell them that you have, or are being evaluated for, COVID-19 infection. Ask your healthcare provider to call the local or state health department.  Wear a facemask You should wear a facemask that covers your nose and mouth when you are in the same room with other people and when you visit a healthcare provider. People who live with or visit you should also wear a facemask while they are in the same room with you.  Separate yourself from other people in your home As much as possible, you should stay in a different room from other people in your home. Also, you should use a separate bathroom, if available.  Avoid sharing household items You should not share  dishes, drinking glasses, cups, eating utensils, towels, bedding, or other items with other people in your home. After using these items, you should wash them thoroughly with soap and water.  Cover your coughs and sneezes Cover your mouth and nose with a tissue when you cough or sneeze, or you can cough or sneeze into your sleeve. Throw used tissues in a lined trash can, and immediately wash your hands with soap and water for at least 20 seconds or use an alcohol-based hand rub.  Wash your [facility] your hands often and thoroughly with soap and water for at least 20 seconds. You can use an alcohol-based hand sanitizer if soap and water are not available and if your hands are not visibly dirty. Avoid touching your eyes, nose, and mouth with unwashed hands.   Prevention Steps for Caregivers and Household Members of Individuals Confirmed to have, or Being Evaluated for, COVID-19 Infection Being Cared for in the Home  If you live with, or provide care at home for, a person confirmed to have, or being evaluated for, COVID-19 infection please follow these guidelines to prevent infection:  Follow healthcare providers instructions Make sure that you understand and can help the patient follow any healthcare provider instructions for all care.  Provide for the patients basic needs You should help the patient with basic needs in the home and provide support for getting groceries, prescriptions, and other personal needs.  Monitor the patients symptoms If they are getting sicker, call his or her medical provider and tell them that the patient has, or is being evaluated for, COVID-19 infection. This will help the healthcare providers office  take steps to keep other people from getting infected. Ask the healthcare provider to call the local or state health department.  Limit the number of people who have contact with the patient  If possible, have only one caregiver for the patient.  Other  household members should stay in another home or place of residence. If this is not possible, they should stay  in another room, or be separated from the patient as much as possible. Use a separate bathroom, if available.  Restrict visitors who do not have an essential need to be in the home.  Keep older adults, very young children, and other sick people away from the patient Keep older adults, very young children, and those who have compromised immune systems or chronic health conditions away from the patient. This includes people with chronic heart, lung, or kidney conditions, diabetes, and cancer.  Ensure good ventilation Make sure that shared spaces in the home have good air flow, such as from an air conditioner or an opened window, weather permitting.  Wash your hands often  Wash your hands often and thoroughly with soap and water for at least 20 seconds. You can use an alcohol based hand sanitizer if soap and water are not available and if your hands are not visibly dirty.  Avoid touching your eyes, nose, and mouth with unwashed hands.  Use disposable paper towels to dry your hands. If not available, use dedicated cloth towels and replace them when they become wet.  Wear a facemask and gloves  Wear a disposable facemask at all times in the room and gloves when you touch or have contact with the patients blood, body fluids, and/or secretions or excretions, such as sweat, saliva, sputum, nasal mucus, vomit, urine, or feces.  Ensure the mask fits over your nose and mouth tightly, and do not touch it during use.  Throw out disposable facemasks and gloves after using them. Do not reuse.  Wash your hands immediately after removing your facemask and gloves.  If your personal clothing becomes contaminated, carefully remove clothing and launder. Wash your hands after handling contaminated clothing.  Place all used disposable facemasks, gloves, and other waste in a lined container before  disposing them with other household waste.  Remove gloves and wash your hands immediately after handling these items.  Do not share dishes, glasses, or other household items with the patient  Avoid sharing household items. You should not share dishes, drinking glasses, cups, eating utensils, towels, bedding, or other items with a patient who is confirmed to have, or being evaluated for, COVID-19 infection.  After the person uses these items, you should wash them thoroughly with soap and water.  Wash laundry thoroughly  Immediately remove and wash clothes or bedding that have blood, body fluids, and/or secretions or excretions, such as sweat, saliva, sputum, nasal mucus, vomit, urine, or feces, on them.  Wear gloves when handling laundry from the patient.  Read and follow directions on labels of laundry or clothing items and detergent. In general, wash and dry with the warmest temperatures recommended on the label.  Clean all areas the individual has used often  Clean all touchable surfaces, such as counters, tabletops, doorknobs, bathroom fixtures, toilets, phones, keyboards, tablets, and bedside tables, every day. Also, clean any surfaces that may have blood, body fluids, and/or secretions or excretions on them.  Wear gloves when cleaning surfaces the patient has come in contact with.  Use a diluted bleach solution (e.g., dilute bleach with 1 part  bleach and 10 parts water) or a household disinfectant with a label that says EPA-registered for coronaviruses. To make a bleach solution at home, add 1 tablespoon of bleach to 1 quart (4 cups) of water. For a larger supply, add  cup of bleach to 1 gallon (16 cups) of water.  Read labels of cleaning products and follow recommendations provided on product labels. Labels contain instructions for safe and effective use of the cleaning product including precautions you should take when applying the product, such as wearing gloves or eye protection  and making sure you have good ventilation during use of the product.  Remove gloves and wash hands immediately after cleaning.  Monitor yourself for signs and symptoms of illness Caregivers and household members are considered close contacts, should monitor their health, and will be asked to limit movement outside of the home to the extent possible. Follow the monitoring steps for close contacts listed on the symptom monitoring form.   ? If you have additional questions, contact your local health department or call the epidemiologist on call at [phone] (available 24/7). ? This guidance is subject to change. For the most up-to-date guidance from [facility], please refer to their website: YouBlogs.pl

## 2018-08-16 NOTE — ED Triage Notes (Signed)
Pt reports temp 99 yesterday. Today temp 102 and states he feels short of breath. C/o headache. Denies cough. Took 600mg  ibuprofen at 2200

## 2018-08-17 ENCOUNTER — Encounter (HOSPITAL_BASED_OUTPATIENT_CLINIC_OR_DEPARTMENT_OTHER): Payer: Self-pay | Admitting: Emergency Medicine

## 2018-08-17 DIAGNOSIS — U071 COVID-19: Secondary | ICD-10-CM | POA: Diagnosis present

## 2018-08-17 DIAGNOSIS — J1289 Other viral pneumonia: Secondary | ICD-10-CM | POA: Diagnosis present

## 2018-08-17 DIAGNOSIS — Z87891 Personal history of nicotine dependence: Secondary | ICD-10-CM | POA: Diagnosis not present

## 2018-08-17 DIAGNOSIS — Z7984 Long term (current) use of oral hypoglycemic drugs: Secondary | ICD-10-CM | POA: Diagnosis not present

## 2018-08-17 DIAGNOSIS — Z7951 Long term (current) use of inhaled steroids: Secondary | ICD-10-CM | POA: Diagnosis not present

## 2018-08-17 DIAGNOSIS — Z6841 Body Mass Index (BMI) 40.0 and over, adult: Secondary | ICD-10-CM | POA: Diagnosis not present

## 2018-08-17 DIAGNOSIS — Z7982 Long term (current) use of aspirin: Secondary | ICD-10-CM | POA: Diagnosis not present

## 2018-08-17 DIAGNOSIS — J1282 Pneumonia due to coronavirus disease 2019: Secondary | ICD-10-CM | POA: Diagnosis present

## 2018-08-17 DIAGNOSIS — E669 Obesity, unspecified: Secondary | ICD-10-CM | POA: Diagnosis present

## 2018-08-17 DIAGNOSIS — E7800 Pure hypercholesterolemia, unspecified: Secondary | ICD-10-CM | POA: Diagnosis present

## 2018-08-17 DIAGNOSIS — Z881 Allergy status to other antibiotic agents status: Secondary | ICD-10-CM | POA: Diagnosis not present

## 2018-08-17 DIAGNOSIS — I10 Essential (primary) hypertension: Secondary | ICD-10-CM

## 2018-08-17 DIAGNOSIS — E119 Type 2 diabetes mellitus without complications: Secondary | ICD-10-CM | POA: Diagnosis present

## 2018-08-17 DIAGNOSIS — E875 Hyperkalemia: Secondary | ICD-10-CM | POA: Diagnosis not present

## 2018-08-17 DIAGNOSIS — Z8042 Family history of malignant neoplasm of prostate: Secondary | ICD-10-CM | POA: Diagnosis not present

## 2018-08-17 DIAGNOSIS — E785 Hyperlipidemia, unspecified: Secondary | ICD-10-CM | POA: Diagnosis present

## 2018-08-17 DIAGNOSIS — K219 Gastro-esophageal reflux disease without esophagitis: Secondary | ICD-10-CM | POA: Diagnosis present

## 2018-08-17 DIAGNOSIS — J9601 Acute respiratory failure with hypoxia: Secondary | ICD-10-CM | POA: Diagnosis present

## 2018-08-17 LAB — CBC WITH DIFFERENTIAL/PLATELET
Abs Immature Granulocytes: 0.02 10*3/uL (ref 0.00–0.07)
Basophils Absolute: 0 10*3/uL (ref 0.0–0.1)
Basophils Relative: 0 %
Eosinophils Absolute: 0.2 10*3/uL (ref 0.0–0.5)
Eosinophils Relative: 3 %
HCT: 42.1 % (ref 39.0–52.0)
Hemoglobin: 13.6 g/dL (ref 13.0–17.0)
Immature Granulocytes: 0 %
Lymphocytes Relative: 22 %
Lymphs Abs: 1.2 10*3/uL (ref 0.7–4.0)
MCH: 26.1 pg (ref 26.0–34.0)
MCHC: 32.3 g/dL (ref 30.0–36.0)
MCV: 80.8 fL (ref 80.0–100.0)
Monocytes Absolute: 0.4 10*3/uL (ref 0.1–1.0)
Monocytes Relative: 7 %
Neutro Abs: 3.7 10*3/uL (ref 1.7–7.7)
Neutrophils Relative %: 68 %
Platelets: 203 10*3/uL (ref 150–400)
RBC: 5.21 MIL/uL (ref 4.22–5.81)
RDW: 13 % (ref 11.5–15.5)
WBC: 5.5 10*3/uL (ref 4.0–10.5)
nRBC: 0 % (ref 0.0–0.2)

## 2018-08-17 LAB — COMPREHENSIVE METABOLIC PANEL
ALT: 53 U/L — ABNORMAL HIGH (ref 0–44)
AST: 46 U/L — ABNORMAL HIGH (ref 15–41)
Albumin: 3.9 g/dL (ref 3.5–5.0)
Alkaline Phosphatase: 66 U/L (ref 38–126)
Anion gap: 11 (ref 5–15)
BUN: 10 mg/dL (ref 6–20)
CO2: 23 mmol/L (ref 22–32)
Calcium: 8.1 mg/dL — ABNORMAL LOW (ref 8.9–10.3)
Chloride: 100 mmol/L (ref 98–111)
Creatinine, Ser: 1 mg/dL (ref 0.61–1.24)
GFR calc Af Amer: >60 mL/min (ref >60)
GFR calc non Af Amer: >60 mL/min (ref >60)
Glucose, Bld: 162 mg/dL — ABNORMAL HIGH (ref 70–99)
Potassium: 3.6 mmol/L (ref 3.5–5.1)
Sodium: 134 mmol/L — ABNORMAL LOW (ref 135–145)
Total Bilirubin: 0.7 mg/dL (ref 0.3–1.2)
Total Protein: 7.4 g/dL (ref 6.5–8.1)

## 2018-08-17 LAB — C-REACTIVE PROTEIN: CRP: 2 mg/dL — ABNORMAL HIGH (ref <1.0)

## 2018-08-17 LAB — FIBRINOGEN: Fibrinogen: 410 mg/dL (ref 210–475)

## 2018-08-17 LAB — FERRITIN: Ferritin: 632 ng/mL — ABNORMAL HIGH (ref 24–336)

## 2018-08-17 LAB — SARS CORONAVIRUS 2 AG (30 MIN TAT): SARS Coronavirus 2 Ag: POSITIVE — AB

## 2018-08-17 LAB — GLUCOSE, CAPILLARY
Glucose-Capillary: 253 mg/dL — ABNORMAL HIGH (ref 70–99)
Glucose-Capillary: 234 mg/dL — ABNORMAL HIGH (ref 70–99)
Glucose-Capillary: 253 mg/dL — ABNORMAL HIGH (ref 70–99)

## 2018-08-17 LAB — BRAIN NATRIURETIC PEPTIDE: B Natriuretic Peptide: 17 pg/mL (ref 0.0–100.0)

## 2018-08-17 LAB — D-DIMER, QUANTITATIVE: D-Dimer, Quant: 0.51 ug/mL-FEU — ABNORMAL HIGH (ref 0.00–0.50)

## 2018-08-17 LAB — PROCALCITONIN: Procalcitonin: <0.1 ng/mL

## 2018-08-17 LAB — LACTATE DEHYDROGENASE: LDH: 303 U/L — ABNORMAL HIGH (ref 98–192)

## 2018-08-17 MED ORDER — INSULIN ASPART 100 UNIT/ML ~~LOC~~ SOLN
0.0000 [IU] | Freq: Three times a day (TID) | SUBCUTANEOUS | Status: DC
  Administered 2018-08-17: 3 [IU] via SUBCUTANEOUS
  Administered 2018-08-18 – 2018-08-19 (×5): 5 [IU] via SUBCUTANEOUS

## 2018-08-17 MED ORDER — METHYLPREDNISOLONE SODIUM SUCC 40 MG IJ SOLR
40.0000 mg | Freq: Three times a day (TID) | INTRAMUSCULAR | Status: AC
  Administered 2018-08-17 – 2018-08-20 (×9): 40 mg via INTRAVENOUS
  Filled 2018-08-17 (×9): qty 1

## 2018-08-17 MED ORDER — ENOXAPARIN SODIUM 80 MG/0.8ML ~~LOC~~ SOLN
0.5000 mg/kg | SUBCUTANEOUS | Status: DC
  Administered 2018-08-17 – 2018-08-20 (×4): 65 mg via SUBCUTANEOUS
  Filled 2018-08-17 (×4): qty 0.8

## 2018-08-17 MED ORDER — ASPIRIN EC 81 MG PO TBEC
81.0000 mg | DELAYED_RELEASE_TABLET | Freq: Every day | ORAL | Status: DC
  Administered 2018-08-17 – 2018-08-21 (×5): 81 mg via ORAL
  Filled 2018-08-17 (×5): qty 1

## 2018-08-17 MED ORDER — ALBUTEROL SULFATE HFA 108 (90 BASE) MCG/ACT IN AERS
2.0000 | INHALATION_SPRAY | Freq: Four times a day (QID) | RESPIRATORY_TRACT | Status: DC
  Administered 2018-08-17: 2 via RESPIRATORY_TRACT
  Filled 2018-08-17: qty 6.7

## 2018-08-17 MED ORDER — ONDANSETRON HCL 4 MG PO TABS: 4.0000 mg | ORAL_TABLET | Freq: Four times a day (QID) | ORAL | Status: DC | PRN

## 2018-08-17 MED ORDER — PANTOPRAZOLE SODIUM 40 MG PO TBEC
40.0000 mg | DELAYED_RELEASE_TABLET | Freq: Every day | ORAL | Status: DC
  Administered 2018-08-17 – 2018-08-21 (×5): 40 mg via ORAL
  Filled 2018-08-17 (×5): qty 1

## 2018-08-17 MED ORDER — SODIUM CHLORIDE 0.9 % IV SOLN
200.0000 mg | Freq: Once | INTRAVENOUS | Status: AC
  Administered 2018-08-17: 200 mg via INTRAVENOUS
  Filled 2018-08-17: qty 40

## 2018-08-17 MED ORDER — METHYLPREDNISOLONE SODIUM SUCC 125 MG IJ SOLR
80.0000 mg | Freq: Once | INTRAMUSCULAR | Status: AC
  Administered 2018-08-17: 80 mg via INTRAVENOUS
  Filled 2018-08-17: qty 2

## 2018-08-17 MED ORDER — TRAMADOL HCL 50 MG PO TABS: 50.0000 mg | ORAL_TABLET | Freq: Four times a day (QID) | ORAL | Status: DC | PRN

## 2018-08-17 MED ORDER — SODIUM CHLORIDE 0.9% FLUSH
3.0000 mL | Freq: Two times a day (BID) | INTRAVENOUS | Status: DC
  Administered 2018-08-17 – 2018-08-21 (×7): 3 mL via INTRAVENOUS

## 2018-08-17 MED ORDER — SODIUM CHLORIDE 0.9% FLUSH: 3.0000 mL | INTRAVENOUS | Status: DC | PRN

## 2018-08-17 MED ORDER — GUAIFENESIN-DM 100-10 MG/5ML PO SYRP: 10.0000 mL | ORAL_SOLUTION | ORAL | Status: DC | PRN

## 2018-08-17 MED ORDER — VITAMIN C 500 MG PO TABS
500.0000 mg | ORAL_TABLET | Freq: Every day | ORAL | Status: DC
  Administered 2018-08-17 – 2018-08-21 (×5): 500 mg via ORAL
  Filled 2018-08-17 (×5): qty 1

## 2018-08-17 MED ORDER — AMLODIPINE BESYLATE 5 MG PO TABS
5.0000 mg | ORAL_TABLET | Freq: Every day | ORAL | Status: DC
  Administered 2018-08-17 – 2018-08-18 (×2): 5 mg via ORAL
  Filled 2018-08-17 (×5): qty 1

## 2018-08-17 MED ORDER — HYDROCOD POLST-CPM POLST ER 10-8 MG/5ML PO SUER
5.0000 mL | Freq: Two times a day (BID) | ORAL | Status: DC | PRN
  Filled 2018-08-17: qty 5

## 2018-08-17 MED ORDER — POLYETHYLENE GLYCOL 3350 17 G PO PACK: 17.0000 g | PACK | Freq: Every day | ORAL | Status: DC | PRN

## 2018-08-17 MED ORDER — ZINC SULFATE 220 (50 ZN) MG PO CAPS
220.0000 mg | ORAL_CAPSULE | Freq: Every day | ORAL | Status: DC
  Administered 2018-08-17 – 2018-08-21 (×5): 220 mg via ORAL
  Filled 2018-08-17 (×5): qty 1

## 2018-08-17 MED ORDER — SODIUM CHLORIDE 0.9 % IV SOLN
100.0000 mg | INTRAVENOUS | Status: AC
  Administered 2018-08-18 – 2018-08-21 (×4): 100 mg via INTRAVENOUS
  Filled 2018-08-17 (×5): qty 20

## 2018-08-17 MED ORDER — HYDRALAZINE HCL 20 MG/ML IJ SOLN: 10.0000 mg | Freq: Four times a day (QID) | INTRAMUSCULAR | Status: DC | PRN

## 2018-08-17 MED ORDER — ACETAMINOPHEN 325 MG PO TABS: 650.0000 mg | ORAL_TABLET | Freq: Four times a day (QID) | ORAL | Status: DC | PRN

## 2018-08-17 MED ORDER — ONDANSETRON HCL 4 MG/2ML IJ SOLN: 4.0000 mg | Freq: Four times a day (QID) | INTRAMUSCULAR | Status: DC | PRN

## 2018-08-17 MED ORDER — SODIUM CHLORIDE 0.9 % IV SOLN: 250.0000 mL | INTRAVENOUS | Status: DC | PRN

## 2018-08-17 NOTE — Progress Notes (Signed)
Pt arrived from [facility] via [place]. CHG completed. Tele applied. Pt oriented to room. Call bell and phonoe within reach.  Rufina Falco, RN BSN 08/17/2018 12:23 PM

## 2018-08-17 NOTE — ED Notes (Signed)
MD aware that Covid test is positive.

## 2018-08-17 NOTE — ED Notes (Signed)
Report given to Clarene Critchley at [facility]

## 2018-08-17 NOTE — ED Notes (Addendum)
Pt updated regarding an expected delay with transport to [place] given. Call bell within reach. Pt instructed to call for any increase in Sob. resp even and unlabored. No complaints.

## 2018-08-17 NOTE — ED Notes (Signed)
Pt updated regarding his room number at [facility].

## 2018-08-17 NOTE — ED Notes (Signed)
Attempt to call report. RN will return call.

## 2018-08-17 NOTE — ED Notes (Signed)
Pt updated his wife that he will be admitted to the hospital.

## 2018-08-17 NOTE — ED Provider Notes (Addendum)
[place] EMERGENCY DEPARTMENT Provider Note   CSN: [id number] Arrival date & time: 08/16/18  2232     History   Chief Complaint Chief Complaint  Patient presents with  . Fever    HPI Edwin [name] is a 53 y.o. male.     The history is provided by the patient.  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 Allergy   . DM (diabetes mellitus) ([facility])    type 2  . High cholesterol   . Hypertension   . Low testosterone     Patient Active Problem List   Diagnosis Date Noted  . Seborrheic keratosis 04/15/2017  . PVC's (premature ventricular contractions) 09/03/2014  . Essential hypertension, benign 11/03/2013  . Diabetes mellitus type II, controlled ([place]) 08/03/2013  . Hyperlipidemia 08/03/2013  . Low serum testosterone level 08/03/2013  . Prostate cancer screening 08/03/2013  . Visit for preventive health examination 08/03/2013    Past Surgical History:  Procedure Laterality Date  . COLONOSCOPY  10 years ago   at [facility] "normal" exam  . NASAL SINUS SURGERY  2010  . UPPER GASTROINTESTINAL ENDOSCOPY  10 years ago  . WISDOM TOOTH EXTRACTION          Home Medications    Prior to Admission medications   Medication Sig Start Date End Date Taking? Authorizing Provider  amLODipine-atorvastatin (CADUET) 5-10 MG tablet Take 1 tablet by mouth daily. 03/31/18    Brunetta Jeans, PA-C  aspirin EC 81 MG tablet Take 1 tablet by mouth daily.    [provider]  atorvastatin (LIPITOR) 20 MG tablet Take 1 tablet (20 mg total) by mouth daily. 05/16/18   Brunetta Jeans, PA-C  Blood Glucose Monitoring Suppl (ONETOUCH VERIO FLEX SYSTEM) w/Device KIT 1 Units by Does not apply route daily. Dx:E11.9 12/10/16   Brunetta Jeans, PA-C  fluticasone [facility]) 50 MCG/ACT nasal spray Place 2 sprays into both nostrils daily as needed for allergies or rhinitis. 03/26/17   Brunetta Jeans, PA-C  glucose blood test strip Use as instructed daily Dx: E11.9 01/02/17   Brunetta Jeans, PA-C  ibuprofen (ADVIL,MOTRIN) 600 MG tablet Take 1 tablet (600 mg total) by mouth every 8 (eight) hours as needed. 05/16/18   Brunetta Jeans, PA-C  metFORMIN (GLUCOPHAGE) 1000 MG tablet TAKE 1 TABLET BY MOUTH TWICE DAILY WITH A MEAL 08/01/18   Brunetta Jeans, PA-C  multivitamin (ONE-A-DAY MEN'S) TABS tablet Take 1 tablet by mouth daily.    [provider]  [facility] DELICA LANCETS 87F MISC 1 [name] by Does not apply route daily. Dx:E11.9 01/02/17   Brunetta Jeans, PA-C  pantoprazole (PROTONIX) 40 MG tablet Take 1 tablet (40 mg total) by mouth daily. 07/08/18   Brunetta Jeans, PA-C  sildenafil (VIAGRA) 50 MG tablet TAKE 2 TABLETS BY MOUTH DAILY AS NEEDED FOR ERECTILE DYSFUNCTION. 07/30/18   Hassell Done,  Luanna Cole, PA-C  sitaGLIPtin (JANUVIA) 100 MG tablet Take 1 tablet (100 mg total) by mouth daily. 08/06/18   Brunetta Jeans, PA-C  triamcinolone cream (KENALOG) 0.1 % Apply 1 application topically 2 (two) times daily. 02/07/17   Brunetta Jeans, PA-C    Family History Family History  Problem Relation Age of Onset  . Aneurysm Mother 40       Deceased  . Healthy Father        Living  . Prostate cancer Paternal Uncle   . Healthy Daughter        x3  . Colon cancer Neg Hx   . Esophageal cancer Neg Hx   . Rectal cancer Neg Hx   . Stomach cancer Neg Hx     Social  History Social History   Tobacco Use  . Smoking status: Former Smoker    Packs/day: 0.50    Years: 15.00    Pack years: 7.50    Quit date: 04/17/2005    Years since quitting: 13.3  . Smokeless tobacco: Never Used  Substance Use Topics  . Alcohol use: No  . Drug use: No     Allergies   Avelox [moxifloxacin hcl in nacl] and Doxycycline   Review of Systems Review of Systems  Constitutional: Positive for fever.  HENT: Negative for rhinorrhea and sore throat.   Respiratory: Positive for shortness of breath. Negative for cough.   Cardiovascular: Negative for chest pain, palpitations and leg swelling.  Gastrointestinal: Negative for vomiting.  Genitourinary: Negative for difficulty urinating and dysuria.  Neurological: Positive for headaches.  All other systems reviewed and are negative.    Physical Exam Updated Vital Signs BP 119/88   Pulse (!) 105   Temp (!) 100.7 F (38.2 C) (Oral)   Resp 18   Ht _0  (1.803 m)   Wt 131.5 kg   SpO2 94%   BMI 40.45 kg/m   Physical Exam Vitals signs and nursing note reviewed.  Constitutional:      General: He is not in acute distress.    Appearance: He is obese.  HENT:     Head: Normocephalic and atraumatic.     Nose: Nose normal.  Eyes:     Conjunctiva/sclera: Conjunctivae normal.     Pupils: Pupils are equal, round, and reactive to light.  Neck:     Musculoskeletal: Normal range of motion and neck supple.  Cardiovascular:     Rate and Rhythm: Normal rate and regular rhythm.     Pulses: Normal pulses.     Heart sounds: Normal heart sounds.  Pulmonary:     Effort: Pulmonary effort is normal. Tachypnea present.     Breath sounds: Normal breath sounds.  Abdominal:     General: Abdomen is flat.     Tenderness: There is no abdominal tenderness.  Musculoskeletal: Normal range of motion.  Skin:    General: Skin is warm and dry.     Capillary Refill: Capillary refill takes less than 2 seconds.  Neurological:     General:  No focal deficit present.     Mental Status: He is alert and oriented to person, place, and time.  Psychiatric:        Mood and Affect: Mood normal.        Behavior: Behavior normal.      ED Treatments / Results  Labs (all labs ordered are listed, but only abnormal results are displayed) Results for orders placed or performed during the hospital encounter of  08/16/18  SARS Coronavirus 2 (Hosp order,Performed in [facility] lab via [employer] ID)   Specimen: Dry Nasal Swab (A[facility] ID Now)  Result Value Ref Range   SARS Coronavirus 2 (Abbott ID Now) POSITIVE (A) NEGATIVE  Comprehensive metabolic panel  Result Value Ref Range   Sodium 134 (L) 135 - 145 mmol/L   Potassium 3.6 3.5 - 5.1 mmol/L   Chloride 100 98 - 111 mmol/L   CO2 23 22 - 32 mmol/L   Glucose, Bld 162 (H) 70 - 99 mg/dL   BUN 10 6 - 20 mg/dL   Creatinine, Ser 1.00 0.61 - 1.24 mg/dL   Calcium 8.1 (L) 8.9 - 10.3 mg/dL   Total Protein 7.4 6.5 - 8.1 g/dL   Albumin 3.9 3.5 - 5.0 g/dL   AST 46 (H) 15 - 41 U/L   ALT 53 (H) 0 - 44 U/L   Alkaline Phosphatase 66 38 - 126 U/L   Total Bilirubin 0.7 0.3 - 1.2 mg/dL   GFR calc non Af Amer >60 >60 mL/min   GFR calc Af Amer >60 >60 mL/min   Anion gap 11 5 - 15  CBC with Differential/Platelet  Result Value Ref Range   WBC 5.5 4.0 - 10.5 K/uL   RBC 5.21 4.22 - 5.81 MIL/uL   Hemoglobin 13.6 13.0 - 17.0 g/dL   HCT 42.1 39.0 - 52.0 %   MCV 80.8 80.0 - 100.0 fL   MCH 26.1 26.0 - 34.0 pg   MCHC 32.3 30.0 - 36.0 g/dL   RDW 13.0 11.5 - 15.5 %   Platelets 203 150 - 400 K/uL   nRBC 0.0 0.0 - 0.2 %   Neutrophils Relative % 68 %   Neutro Abs 3.7 1.7 - 7.7 K/uL   Lymphocytes Relative 22 %   Lymphs Abs 1.2 0.7 - 4.0 K/uL   Monocytes Relative 7 %   Monocytes Absolute 0.4 0.1 - 1.0 K/uL   Eosinophils Relative 3 %   Eosinophils Absolute 0.2 0.0 - 0.5 K/uL   Basophils Relative 0 %   Basophils Absolute 0.0 0.0 - 0.1 K/uL   Immature Granulocytes 0 %   Abs Immature Granulocytes 0.02 0.00 -  0.07 K/uL  D-dimer, quantitative (not at [facility])  Result Value Ref Range   D-Dimer, Quant 0.51 (H) 0.00 - 0.50 ug/mL-FEU   Dg Chest Portable 1 View  Result Date: 08/16/2018 CLINICAL DATA:  Fever, shortness of breath EXAM: PORTABLE CHEST 1 VIEW COMPARISON:  None. FINDINGS: Low lung volumes. Patchy airspace opacities in the mid and lower lungs. Heart is borderline in size. No effusions or acute bony abnormality. IMPRESSION: Low lung volumes. Patchy bilateral mid and lower lung airspace opacities could reflect edema or infection. Atypical/viral infection not excluded. Electronically Signed   By: Rolm Baptise M.D.   On: 08/16/2018 23:47      Radiology Dg Chest Portable 1 View  Result Date: 08/16/2018 CLINICAL DATA:  Fever, shortness of breath EXAM: PORTABLE CHEST 1 VIEW COMPARISON:  None. FINDINGS: Low lung volumes. Patchy airspace opacities in the mid and lower lungs. Heart is borderline in size. No effusions or acute bony abnormality. IMPRESSION: Low lung volumes. Patchy bilateral mid and lower lung airspace opacities could reflect edema or infection. Atypical/viral infection not excluded. Electronically Signed   By: Rolm Baptise M.D.   On: 08/16/2018 23:47    Procedures Procedures (including critical care time)  Medications Ordered in ED Medications  methylPREDNISolone sodium succinate (SOLU-MEDROL) 125 mg/2 mL injection 80 mg (  has no administration in time range)    Case D/W Dr. Sarajane Jews at [facility], please start 80 mg of solumedrol. Continue O2.    Some inflammatory markers still pending, they are in process at this time    Final Clinical Impressions(s) / ED Diagnoses   Final diagnoses:  COVID-19    Admit to [facility] service for covid infection with SOB and hypoxia.       Analeigh Aries, MD 08/17/18 0221

## 2018-08-17 NOTE — ED Notes (Signed)
Awaiting room assignment, given diet soda, informed of delays

## 2018-08-17 NOTE — Progress Notes (Signed)
Contacted pts wife via text message at her request. Edwin [name] will be the point of contact for Edwin [name]. She denies further questions or concerns at this time. Will continue to follow.  Edwin Falco, RN BSN 08/17/2018 1:25 PM

## 2018-08-17 NOTE — ED Notes (Signed)
VSS remain stable. No complaints at present.

## 2018-08-17 NOTE — ED Notes (Signed)
Report given to JC with [facility].  

## 2018-08-17 NOTE — H&P (Signed)
HISTORY AND PHYSICAL       PATIENT DETAILS Name: Edwin [name] Age: 53 y.o. Sex: male Date of Birth: [birth year] Admit Date: 08/16/2018 IOX:BDZHGD, Luanna Cole, PA-C   Patient coming from: Transfer from [facility]:  Fever, shortness of breath cough x3 days.  HPI: Edwin [name] is a 53 y.o. male with medical history significant of DM, HTN, dyslipidemia, GERD-who presented to the hospital for the above-noted complaints.  Per patient-since this past Friday-he noted low-grade fever.  Edwin Pomfret started having high-grade fever and noted that he was short of breath on ambulation.  He also developed a dry cough.  He subsequently presented to the emergency room-where he was noted to have O2 saturation of around 88, a chest x-ray confirmed bilateral patchy infiltrates.  He subsequently tested positive for COVID-19.  Patient was then transferred to the hospitalist service.  Patient denies any nausea, vomiting or diarrhea. Denies any headache, chest pain.  He denies any shortness of breath at rest.  ED Course:  Found to have COVID-19-x-ray with patchy interstitial pneumonia-given 1 dose of Solu-Medrol.  Subsequently transferred to [facility] service at [facility].  Note: Lives at: Home Mobility:  Independent Chronic Indwelling Foley:no   REVIEW OF SYSTEMS:  Constitutional:   No  weight loss, night sweats,  chills  HEENT:    No headaches, Dysphagia,Tooth/dental problems,Sore throat,  No sneezing, itching, ear ache, nasal congestion, post nasal drip  Cardio-vascular: No chest pain,Orthopnea, PND,lower extremity edema, anasarca, palpitations  GI:  No heartburn, indigestion, abdominal pain, nausea, vomiting, diarrhea, melena or hematochezia  Resp: No  hemoptysis,plueritic chest pain.   Skin:  No rash or lesions.  GU:  No dysuria, change in color of urine, no urgency or frequency.  No flank pain.  Musculoskeletal: No joint pain or swelling.  No decreased range of  motion.  No back pain.  Endocrine: No heat intolerance, no cold intolerance, no polyuria, no polydipsia  Psych: No change in mood or affect. No depression or anxiety.  No memory loss.   ALLERGIES:   Allergies  Allergen Reactions  . Avelox [Moxifloxacin Hcl In Nacl] Hives and Itching  . Doxycycline Rash    PAST MEDICAL HISTORY: Past Medical History:  Diagnosis Date  . Allergy   . DM (diabetes mellitus) ([place])    type 2  . High cholesterol   . Hypertension   . Low testosterone     PAST SURGICAL HISTORY: Past Surgical History:  Procedure Laterality Date  . COLONOSCOPY  10 years ago   at [facility] "normal" exam  . NASAL SINUS SURGERY  2010  . UPPER GASTROINTESTINAL ENDOSCOPY  10 years ago  . WISDOM TOOTH EXTRACTION      MEDICATIONS AT HOME: Prior to Admission medications   Medication Sig Start Date End Date Taking? Authorizing Provider  amLODipine-atorvastatin (CADUET) 5-10 MG tablet Take 1 tablet by mouth daily. 03/31/18   Brunetta Jeans, PA-C  aspirin EC 81 MG tablet Take 1 tablet by mouth daily.    [provider]  atorvastatin (LIPITOR) 20 MG tablet Take 1 tablet (20 mg total) by mouth daily. 05/16/18   Brunetta Jeans, PA-C  Blood Glucose Monitoring Suppl (ONETOUCH VERIO FLEX SYSTEM) w/Device KIT 1 Units by Does not apply route daily. Dx:E11.9 12/10/16   Brunetta Jeans, PA-C  fluticasone [facility]) 50 MCG/ACT nasal spray Place 2 sprays into both nostrils daily as needed for allergies or rhinitis. 03/26/17   Brunetta Jeans, PA-C  glucose blood test strip Use as instructed daily Dx: E11.9 01/02/17   Brunetta Jeans, PA-C  ibuprofen (ADVIL,MOTRIN) 600 MG tablet Take 1 tablet (600 mg total) by mouth every 8 (eight) hours as needed. 05/16/18   Brunetta Jeans, PA-C  metFORMIN (GLUCOPHAGE) 1000 MG tablet TAKE 1 TABLET BY MOUTH TWICE DAILY WITH A MEAL 08/01/18   Brunetta Jeans, PA-C  multivitamin (ONE-A-DAY MEN'S) TABS tablet Take 1 tablet by mouth  daily.    [provider]  [facility] DELICA LANCETS 60V MISC 1 Stick by Does not apply route daily. Dx:E11.9 01/02/17   Brunetta Jeans, PA-C  pantoprazole (PROTONIX) 40 MG tablet Take 1 tablet (40 mg total) by mouth daily. 07/08/18   Brunetta Jeans, PA-C  sildenafil (VIAGRA) 50 MG tablet TAKE 2 TABLETS BY MOUTH DAILY AS NEEDED FOR ERECTILE DYSFUNCTION. 07/30/18   Brunetta Jeans, PA-C  sitaGLIPtin (JANUVIA) 100 MG tablet Take 1 tablet (100 mg total) by mouth daily. 08/06/18   Brunetta Jeans, PA-C  triamcinolone cream (KENALOG) 0.1 % Apply 1 application topically 2 (two) times daily. 02/07/17   Brunetta Jeans, PA-C    FAMILY HISTORY: Family History  Problem Relation Age of Onset  . Aneurysm Mother 26       Deceased  . Healthy Father        Living  . Prostate cancer Paternal Uncle   . Healthy Daughter        x3  . Colon cancer Neg Hx   . Esophageal cancer Neg Hx   . Rectal cancer Neg Hx   . Stomach cancer Neg Hx       SOCIAL HISTORY:  reports that he quit smoking about 13 years ago. He has a 7.50 pack-year smoking history. He has never used smokeless tobacco. He reports that he does not drink alcohol or use drugs.  PHYSICAL EXAM: Blood pressure 124/84, pulse (!) 104, temperature 98.8 F (37.1 C), temperature source Oral, resp. rate 17, height 5' 11"  (1.803 m), weight 131.5 kg, SpO2 98 %.  General appearance :Awake, alert, not in any distress.  Eyes:, pupils equally reactive to light and accomodation,no scleral icterus.Pink conjunctiva HEENT: Atraumatic and Normocephalic Neck: supple, no JVD.  Resp:Good air entry bilaterally, no added sounds  CVS: S1 S2 regular, no murmurs.  GI: Bowel sounds present, Non tender and not distended with no gaurding, rigidity or rebound. Extremities: B/L Lower Ext shows no edema, both legs are warm to touch Neurology:  speech clear,Non focal, sensation is grossly intact. Psychiatric: Normal judgment and insight. Alert and oriented x  3. Normal mood. Musculoskeletal:gait appears to be normal.No digital cyanosis Skin:No Rash, warm and dry Wounds:N/A  LABS ON ADMISSION:  I have personally reviewed following labs and imaging studies  CBC: Recent Labs  Lab 08/16/18 2335  WBC 5.5  NEUTROABS 3.7  HGB 13.6  HCT 42.1  MCV 80.8  PLT 371    Basic Metabolic Panel: Recent Labs  Lab 08/16/18 2335  NA 134*  K 3.6  CL 100  CO2 23  GLUCOSE 162*  BUN 10  CREATININE 1.00  CALCIUM 8.1*    GFR: Estimated Creatinine Clearance: 119.5 mL/min (by C-G formula based on SCr of 1 mg/dL).  Liver Function Tests: Recent Labs  Lab 08/16/18 2335  AST 46*  ALT 53*  ALKPHOS 66  BILITOT 0.7  PROT 7.4  ALBUMIN 3.9   No results for input(s): LIPASE, AMYLASE in the last 168 hours. No results for input(s):  AMMONIA in the last 168 hours.  Coagulation Profile: No results for input(s): INR, PROTIME in the last 168 hours.  Cardiac Enzymes: No results for input(s): CKTOTAL, CKMB, CKMBINDEX, TROPONINI in the last 168 hours.  BNP (last 3 results) No results for input(s): PROBNP in the last 8760 hours.  HbA1C: No results for input(s): HGBA1C in the last 72 hours.  CBG: Recent Labs  Lab 08/17/18 1223  GLUCAP 253*    Lipid Profile: No results for input(s): CHOL, HDL, LDLCALC, TRIG, CHOLHDL, LDLDIRECT in the last 72 hours.  Thyroid Function Tests: No results for input(s): TSH, T4TOTAL, FREET4, T3FREE, THYROIDAB in the last 72 hours.  Anemia Panel: Recent Labs    08/16/18 2335  FERRITIN 632*    Urine analysis:    Component Value Date/Time   BILIRUBINUR negative 08/03/2014 1026   NITRITE negative 08/03/2014 1026   LEUKOCYTESUR Negative 08/03/2014 1026    Sepsis Labs: Lactic Acid, Venous No results found for: [place]   Microbiology: Recent Results (from the past 240 hour(s))  SARS Coronavirus 2 (Hosp order,Performed in [place] lab via [facility] ID)     Status: Abnormal   Collection Time: 08/16/18  11:35 PM   Specimen: Dry Nasal Swab ([facility] ID Now)  Result Value Ref Range Status   SARS Coronavirus 2 ([facility]ott ID Now) POSITIVE (A) NEGATIVE Final    Comment: RESULT CALLED TO, READ BACK BY AND VERIFIED WITH: MAYNARD,C, RN @ 0030 08/17/18 BY GWYN,P (NOTE) Interpretive Result Comment(s): COVID 19 Positive SARS CoV 2 target nucleic acids are DETECTED. The SARS CoV 2 RNA is generally detectable in upper and lower respiratory specimens during the acute phase of infection.  Positive results are indicative of active infection with SARS CoV 2.  Clinical correlation with patient history and other diagnostic information is necessary to determine patient infection status.  Positive results do not rule out bacterial infection or coinfection with other viruses. The expected result is Negative. COVID 19 Negative SARS CoV 2 target nucleic acids are NOT DETECTED. The SARS CoV 2 RNA is generally detectable in upper and lower respiratory specimens during the acute phase of infection.  Negative results do not preclude SARS CoV 2 infection, do not rule out coinfections with other pathogens, and should not be used as the sole basis for treatme nt or other patient management decisions.  Negative results must be combined with clinical observations, patient history, and epidemiological information. The expected result is Negative. Invalid Presence or absence of SARS CoV 2 nucleic acids cannot be determined. Repeat testing was performed on the submitted specimen and repeated Invalid results were obtained.  If clinically indicated, additional testing on a new specimen with an alternate test methodology [phone]) is advised.  The SARS CoV 2 RNA is generally detectable in upper and lower respiratory specimens during the acute phase of infection. The expected result is Negative. Fact Sheet for Patients:  GolfingFamily.no Fact Sheet for Healthcare Providers:  https://www.hernandez-brewer.com/ This test is not yet approved or cleared by the [place] FDA and has been authorized for detection and/or diagnosis of SARS CoV 2 by FDA under an Emergency Use Authorization (EUA).  This EU A will remain in effect (meaning this test can be used) for the duration of the COVID19 declaration under Section 564(b)(1) of the Act, 21 U.S.C. section [phone] 3(b)(1), unless the authorization is terminated or revoked sooner. Performed at [facility], [address] [place], [place] [address]       RADIOLOGIC STUDIES ON  ADMISSION: Dg Chest Portable 1 View  Result Date: 08/16/2018 CLINICAL DATA:  Fever, shortness of breath EXAM: PORTABLE CHEST 1 VIEW COMPARISON:  None. FINDINGS: Low lung volumes. Patchy airspace opacities in the mid and lower lungs. Heart is borderline in size. No effusions or acute bony abnormality. IMPRESSION: Low lung volumes. Patchy bilateral mid and lower lung airspace opacities could reflect edema or infection. Atypical/viral infection not excluded. Electronically Signed   By: Rolm Baptise M.D.   On: 08/16/2018 23:47    I have personally reviewed images of chest xray + bilateral patchy infiltrates  EKG:  Not performed  ASSESSMENT AND PLAN: Acute hypoxemic respiratory failure secondary to COVID-19 pneumonia: Appears comfortable-although desaturates down to 88 on room air.  Due to hypoxia requiring oxygen supplementation and patchy bilateral infiltrates-qualifies for Remdesivir.  Continue Solu-Medrol.  If hypoxia worsens-we can consider Actemra or convalescent plasma.  We will follow inflammatory markers closely.  DM-2: Hold oral hypoglycemic agents-starting SSI-follow and optimize  HTN: Continue amlodipine-follow.  Dyslipidemia: Hold statins.   GERD: Continue PPI  Further plan will depend as patient's clinical course evolves and further radiologic and laboratory data become available. Patient will be monitored  closely.  Above noted plan was discussed with patient face-to-face at bedside.  Patient will update his significant other himself.  CONSULTS: None  DVT Prophylaxis: Prophylactic Lovenox   Code Status: Full Code  Disposition Plan:  Discharge back home possibly in 5 days, depending on clinical course  Admission status:  Inpatient going to tele  Total time spent  55 minutes.Greater than 50% of this time was spent in counseling, explanation of diagnosis, planning of further management, and coordination of care.  Severity of illness  The appropriate patient status for this patient is INPATIENT. Inpatient status is judged to be reasonable and necessary in order to provide the required intensity of service to ensure the patient's safety. The patient's presenting symptoms, physical exam findings, and initial radiographic and laboratory data in the context of their chronic comorbidities is felt to place them at high risk for further clinical deterioration. Furthermore, it is not anticipated that the patient will be medically stable for discharge from the hospital within 2 midnights of admission. The following factors support the patient status of inpatient.   " The patient's presenting symptoms include fever, shortness of breath " The worrisome physical exam findings include hypoxia " The initial radiographic and laboratory data are worrisome because of bilateral patchy infiltrates, COVID-19 positive " The chronic co-morbidities include DM, HTN, dyslipidemia   * I certify that at the point of admission it is my clinical judgment that the patient will require inpatient hospital care spanning beyond 2 midnights from the point of admission due to high intensity of service, high risk for further deterioration and high frequency of surveillance required  Oren Binet [facility] Pager [phone]  If 7PM-7AM, please contact night-coverage  Please page via [email or web address]  Go to  [email or web address] and use Edwin [name]'s universal password to access. If you do not have the password, please contact the hospital operator.  Locate the [facility] provider you are looking for under [facility] and page to a number that you can be directly reached. If you still have difficulty reaching the provider, please page the [facility] (Director on Call) for the Hospitalists listed on amion for assistance.  08/17/2018, 12:39 PM

## 2018-08-18 LAB — COMPREHENSIVE METABOLIC PANEL
ALT: 62 U/L — ABNORMAL HIGH (ref 0–44)
AST: 46 U/L — ABNORMAL HIGH (ref 15–41)
Albumin: 3.9 g/dL (ref 3.5–5.0)
Alkaline Phosphatase: 72 U/L (ref 38–126)
Anion gap: 10 (ref 5–15)
BUN: 15 mg/dL (ref 6–20)
CO2: 26 mmol/L (ref 22–32)
Calcium: 8.4 mg/dL — ABNORMAL LOW (ref 8.9–10.3)
Chloride: 101 mmol/L (ref 98–111)
Creatinine, Ser: 0.95 mg/dL (ref 0.61–1.24)
GFR calc Af Amer: >60 mL/min (ref >60)
GFR calc non Af Amer: >60 mL/min (ref >60)
Glucose, Bld: 316 mg/dL — ABNORMAL HIGH (ref 70–99)
Potassium: 4.4 mmol/L (ref 3.5–5.1)
Sodium: 137 mmol/L (ref 135–145)
Total Bilirubin: 0.6 mg/dL (ref 0.3–1.2)
Total Protein: 8 g/dL (ref 6.5–8.1)

## 2018-08-18 LAB — GLUCOSE, CAPILLARY
Glucose-Capillary: 234 mg/dL — ABNORMAL HIGH (ref 70–99)
Glucose-Capillary: 275 mg/dL — ABNORMAL HIGH (ref 70–99)
Glucose-Capillary: 271 mg/dL — ABNORMAL HIGH (ref 70–99)
Glucose-Capillary: 316 mg/dL — ABNORMAL HIGH (ref 70–99)
Glucose-Capillary: 370 mg/dL — ABNORMAL HIGH (ref 70–99)

## 2018-08-18 LAB — CBC
HCT: 44.3 % (ref 39.0–52.0)
Hemoglobin: 14.3 g/dL (ref 13.0–17.0)
MCH: 26.1 pg (ref 26.0–34.0)
MCHC: 32.3 g/dL (ref 30.0–36.0)
MCV: 81 fL (ref 80.0–100.0)
Platelets: 255 10*3/uL (ref 150–400)
RBC: 5.47 MIL/uL (ref 4.22–5.81)
RDW: 13.1 % (ref 11.5–15.5)
WBC: 7.1 10*3/uL (ref 4.0–10.5)
nRBC: 0 % (ref 0.0–0.2)

## 2018-08-18 LAB — C-REACTIVE PROTEIN: CRP: 1.6 mg/dL — ABNORMAL HIGH (ref <1.0)

## 2018-08-18 LAB — HIV ANTIBODY (ROUTINE TESTING W REFLEX): HIV Screen 4th Generation wRfx: NONREACTIVE

## 2018-08-18 LAB — ABO/RH: ABO/RH(D): AB POS

## 2018-08-18 LAB — LACTATE DEHYDROGENASE: LDH: 311 U/L — ABNORMAL HIGH (ref 98–192)

## 2018-08-18 LAB — FERRITIN: Ferritin: 918 ng/mL — ABNORMAL HIGH (ref 24–336)

## 2018-08-18 LAB — D-DIMER, QUANTITATIVE: D-Dimer, Quant: 0.41 ug/mL-FEU (ref 0.00–0.50)

## 2018-08-18 MED ORDER — ALBUTEROL SULFATE HFA 108 (90 BASE) MCG/ACT IN AERS: 2.0000 | INHALATION_SPRAY | Freq: Four times a day (QID) | RESPIRATORY_TRACT | Status: DC | PRN

## 2018-08-18 NOTE — Progress Notes (Signed)
Inpatient Diabetes Program Recommendations  AACE/ADA: New Consensus Statement on Inpatient Glycemic Control (2015)  Target Ranges:  Prepandial:   less than 140 mg/dL      Peak postprandial:   less than 180 mg/dL (1-2 hours)      Critically ill patients:  140 - 180 mg/dL   Lab Results  Component Value Date   GLUCAP 275 (H) 08/18/2018   HGBA1C 9.0 (H) 08/04/2018    Review of Glycemic Control  Diabetes history: DM2 Outpatient Diabetes medications: metformin 1000 mg bid, Januvia 100 mg QD Current orders for Inpatient glycemic control: Novolog 0-9 units tidwc On Solumedrol 40 mg Q8H.  HgbA1C - 9% - uncontrolled  Inpatient Diabetes Program Recommendations:     Add Levemir 10 units bid Increase Novolog to 0-15 units tidwc and hs Add Novolog 3 units tidwc for meal coverage insulin if pt eats > 50% meal Add CHO mod med to heart healthy diet.  Will speak to pt regarding HgbA1C of 9% during hospital stay.  Continue to follow daily.  Thank you. Lorenda Peck, RD, LDN, CDE Inpatient Diabetes Coordinator [phone]

## 2018-08-18 NOTE — Progress Notes (Addendum)
PROGRESS NOTE                                                                                                                                                                                                             Patient Demographics:    Edwin [name], is a 53 y.o. male, DOB - 1966-02-18, GMW:[id number]  Outpatient Primary MD for the patient is Delorse Limber    LOS - 1  Chief Complaint  Patient presents with  . Fever       Brief Narrative: Patient is a 53 y.o. male with PMHx of DM, HTN, dyslipidemia, GERD presenting with acute hypoxic respiratory failure secondary to COVID-19 pneumonia.   Subjective:    [name] today feels better-no fever stable on 2 L of oxygen.  Denies any shortness of breath.   Assessment  & Plan :   Acute Hypoxic Resp Failure due to Covid 19 Viral pneumonia: Stable on 2 L-feels better-no shortness of breath.  Continue Solu-Medrol and Remdesivir.  If hypoxia worsens-we will be a candidate for Actemra and or convalescent plasma.  COVID-19 Labs:  Recent Labs    08/16/18 2335  DDIMER 0.51*  FERRITIN 632*  LDH 303*  CRP 2.0*    Lab Results  Component Value Date   SARSCOV2NAA POSITIVE (A) 08/16/2018     COVID-19 Medications: 6/13>>Remdesivir 6/13>>Solumedrol  DM-2: Hold oral hypoglycemic agents-CBGs relatively stable with SSI  HTN:  Controlled-continue amlodipine-follow.  Dyslipidemia: Hold statins.   GERD: Continue PPI   ABG: No results found for: PHART, PCO2ART, PO2ART, HCO3, TCO2, ACIDBASEDEF, O2SAT  Condition - Extremely Guarded/Stable  Family Communication  :  Left message for spouse  Code Status :  Full Code  Diet :  Diet Order            Diet Heart Room service appropriate? Yes; Fluid consistency: Thin  Diet effective now               Disposition Plan  :  Remain inpatient  Consults  : None  Procedures  :  None  DVT  Prophylaxis  :  Lovenox  Lab Results  Component Value Date   PLT 203 08/16/2018    Inpatient Medications  Scheduled Meds: . amLODipine  5 mg Oral Daily  . aspirin EC  81 mg Oral Daily  . enoxaparin (  LOVENOX) injection  0.5 mg/kg Subcutaneous Q24H  . insulin aspart  0-9 Units Subcutaneous TID WC  . methylPREDNISolone (SOLU-MEDROL) injection  40 mg Intravenous Q8H  . pantoprazole  40 mg Oral Daily  . sodium chloride flush  3 mL Intravenous Q12H  . vitamin C  500 mg Oral Daily  . zinc sulfate  220 mg Oral Daily   Continuous Infusions: . sodium chloride    . remdesivir 100 mg in NS 250 mL     PRN Meds:.sodium chloride, acetaminophen, albuterol, chlorpheniramine-HYDROcodone, guaiFENesin-dextromethorphan, hydrALAZINE, ondansetron **OR** ondansetron (ZOFRAN) IV, polyethylene glycol, sodium chloride flush, traMADol  Antibiotics  :    Anti-infectives (From admission, onward)   Start     Dose/Rate Route Frequency Ordered Stop   08/18/18 1430  remdesivir 100 mg in sodium chloride 0.9 % 230 mL IVPB     100 mg 500 mL/hr over 30 Minutes Intravenous Every 24 hours 08/17/18 1308 08/22/18 1429   08/17/18 1430  remdesivir 200 mg in sodium chloride 0.9 % 210 mL IVPB     200 mg 500 mL/hr over 30 Minutes Intravenous Once 08/17/18 1308 08/17/18 1513       Time Spent in minutes  25   Oren Binet M.D on 08/18/2018 at 9:58 AM  To page go to [email or web address] - use universal password  [facility] -  Office  [phone]     Admit date - 08/16/2018    1    Objective:   Vitals:   08/17/18 2036 08/17/18 2300 08/18/18 0000 08/18/18 0400  BP: 113/74     Pulse: 88 79 78 72  Resp: (!) 27 18 20 17   Temp: 98.5 F (36.9 C)     TempSrc: Oral     SpO2: 96% 97% 96% 99%  Weight:      Height:        Wt Readings from Last 3 Encounters:  08/17/18 131.5 kg  07/29/18 (!) 136.5 kg  07/08/18 135.2 kg     Intake/Output Summary (Last 24 hours) at 08/18/2018 0958 Last data filed at  08/18/2018 0804 Gross per 24 hour  Intake 250 ml  Output 2075 ml  Net -1825 ml     Physical Exam Gen Exam:Alert awake-not in any distress HEENT:atraumatic, normocephalic Chest: B/L clear to auscultation anteriorly CVS:S1S2 regular Abdomen:soft non tender, non distended Extremities:no edema Neurology:Non focal Skin: no rash   Data Review:    CBC Recent Labs  Lab 08/16/18 2335  WBC 5.5  HGB 13.6  HCT 42.1  PLT 203  MCV 80.8  MCH 26.1  MCHC 32.3  RDW 13.0  LYMPHSABS 1.2  MONOABS 0.4  EOSABS 0.2  BASOSABS 0.0    Chemistries  Recent Labs  Lab 08/16/18 2335  NA 134*  K 3.6  CL 100  CO2 23  GLUCOSE 162*  BUN 10  CREATININE 1.00  CALCIUM 8.1*  AST 46*  ALT 53*  ALKPHOS 66  BILITOT 0.7   ------------------------------------------------------------------------------------------------------------------ No results for input(s): CHOL, HDL, LDLCALC, TRIG, CHOLHDL, LDLDIRECT in the last 72 hours.  Lab Results  Component Value Date   HGBA1C 9.0 (H) 08/04/2018   ------------------------------------------------------------------------------------------------------------------ No results for input(s): TSH, T4TOTAL, T3FREE, THYROIDAB in the last 72 hours.  Invalid input(s): FREET3 ------------------------------------------------------------------------------------------------------------------ Recent Labs    08/16/18 2335  FERRITIN 632*    Coagulation profile No results for input(s): INR, PROTIME in the last 168 hours.  Recent Labs    08/16/18 2335  DDIMER 0.51*    Cardiac Enzymes No results  for input(s): CKMB, TROPONINI, MYOGLOBIN in the last 168 hours.  Invalid input(s): CK ------------------------------------------------------------------------------------------------------------------    Component Value Date/Time   BNP 17.0 08/17/2018 1415    Micro Results Recent Results (from the past 240 hour(s))  SARS Coronavirus 2 (Hosp order,Performed  in [facility] lab via [employer] ID)     Status: Abnormal   Collection Time: 08/16/18 11:35 PM   Specimen: Dry Nasal Swab (Abbott ID Now)  Result Value Ref Range Status   SARS Coronavirus 2 (Abbott ID Now) POSITIVE (A) NEGATIVE Final    Comment: RESULT CALLED TO, READ BACK BY AND VERIFIED WITH: MAYNARD,C, RN @ 0030 08/17/18 BY GWYN,P (NOTE) Interpretive Result Comment(s): COVID 19 Positive SARS CoV 2 target nucleic acids are DETECTED. The SARS CoV 2 RNA is generally detectable in upper and lower respiratory specimens during the acute phase of infection.  Positive results are indicative of active infection with SARS CoV 2.  Clinical correlation with patient history and other diagnostic information is necessary to determine patient infection status.  Positive results do not rule out bacterial infection or coinfection with other viruses. The expected result is Negative. COVID 19 Negative SARS CoV 2 target nucleic acids are NOT DETECTED. The SARS CoV 2 RNA is generally detectable in upper and lower respiratory specimens during the acute phase of infection.  Negative results do not preclude SARS CoV 2 infection, do not rule out coinfections with other pathogens, and should not be used as the sole basis for treatme nt or other patient management decisions.  Negative results must be combined with clinical observations, patient history, and epidemiological information. The expected result is Negative. Invalid Presence or absence of SARS CoV 2 nucleic acids cannot be determined. Repeat testing was performed on the submitted specimen and repeated Invalid results were obtained.  If clinically indicated, additional testing on a new specimen with an alternate test methodology [phone]) is advised.  The SARS CoV 2 RNA is generally detectable in upper and lower respiratory specimens during the acute phase of infection. The expected result is Negative. Fact Sheet for Patients:   GolfingFamily.no Fact Sheet for Healthcare Providers: https://www.hernandez-brewer.com/ This test is not yet approved or cleared by the [place] FDA and has been authorized for detection and/or diagnosis of SARS CoV 2 by FDA under an Emergency Use Authorization (EUA).  This EU A will remain in effect (meaning this test can be used) for the duration of the COVID19 declaration under Section 564(b)(1) of the Act, 21 U.S.C. section [phone] 3(b)(1), unless the authorization is terminated or revoked sooner. Performed at [facility], [address] [place], [place] [address]     Radiology Reports Dg Chest Portable 1 View  Result Date: 08/16/2018 CLINICAL DATA:  Fever, shortness of breath EXAM: PORTABLE CHEST 1 VIEW COMPARISON:  None. FINDINGS: Low lung volumes. Patchy airspace opacities in the mid and lower lungs. Heart is borderline in size. No effusions or acute bony abnormality. IMPRESSION: Low lung volumes. Patchy bilateral mid and lower lung airspace opacities could reflect edema or infection. Atypical/viral infection not excluded. Electronically Signed   By: Rolm Baptise M.D.   On: 08/16/2018 23:47

## 2018-08-19 LAB — CBC
HCT: 45.4 % (ref 39.0–52.0)
Hemoglobin: 14.6 g/dL (ref 13.0–17.0)
MCH: 26.2 pg (ref 26.0–34.0)
MCHC: 32.2 g/dL (ref 30.0–36.0)
MCV: 81.4 fL (ref 80.0–100.0)
Platelets: 303 10*3/uL (ref 150–400)
RBC: 5.58 MIL/uL (ref 4.22–5.81)
RDW: 13.2 % (ref 11.5–15.5)
WBC: 7.1 10*3/uL (ref 4.0–10.5)
nRBC: 0 % (ref 0.0–0.2)

## 2018-08-19 LAB — COMPREHENSIVE METABOLIC PANEL
ALT: 58 U/L — ABNORMAL HIGH (ref 0–44)
AST: 36 U/L (ref 15–41)
Albumin: 3.7 g/dL (ref 3.5–5.0)
Alkaline Phosphatase: 69 U/L (ref 38–126)
Anion gap: 13 (ref 5–15)
BUN: 22 mg/dL — ABNORMAL HIGH (ref 6–20)
CO2: 26 mmol/L (ref 22–32)
Calcium: 8.8 mg/dL — ABNORMAL LOW (ref 8.9–10.3)
Chloride: 99 mmol/L (ref 98–111)
Creatinine, Ser: 0.98 mg/dL (ref 0.61–1.24)
GFR calc Af Amer: >60 mL/min (ref >60)
GFR calc non Af Amer: >60 mL/min (ref >60)
Glucose, Bld: 320 mg/dL — ABNORMAL HIGH (ref 70–99)
Potassium: 5.7 mmol/L — ABNORMAL HIGH (ref 3.5–5.1)
Sodium: 138 mmol/L (ref 135–145)
Total Bilirubin: 0.3 mg/dL (ref 0.3–1.2)
Total Protein: 7.9 g/dL (ref 6.5–8.1)

## 2018-08-19 LAB — FERRITIN: Ferritin: 868 ng/mL — ABNORMAL HIGH (ref 24–336)

## 2018-08-19 LAB — LACTATE DEHYDROGENASE: LDH: 301 U/L — ABNORMAL HIGH (ref 98–192)

## 2018-08-19 LAB — GLUCOSE, CAPILLARY
Glucose-Capillary: 277 mg/dL — ABNORMAL HIGH (ref 70–99)
Glucose-Capillary: 299 mg/dL — ABNORMAL HIGH (ref 70–99)
Glucose-Capillary: 277 mg/dL — ABNORMAL HIGH (ref 70–99)
Glucose-Capillary: 408 mg/dL — ABNORMAL HIGH (ref 70–99)

## 2018-08-19 LAB — C-REACTIVE PROTEIN: CRP: 1.1 mg/dL — ABNORMAL HIGH (ref <1.0)

## 2018-08-19 MED ORDER — SODIUM ZIRCONIUM CYCLOSILICATE 10 G PO PACK
10.0000 g | PACK | Freq: Once | ORAL | Status: AC
  Administered 2018-08-19: 10 g via ORAL
  Filled 2018-08-19: qty 1

## 2018-08-19 MED ORDER — INSULIN GLARGINE 100 UNIT/ML ~~LOC~~ SOLN
10.0000 [IU] | Freq: Every day | SUBCUTANEOUS | Status: DC
  Administered 2018-08-19: 10 [IU] via SUBCUTANEOUS
  Filled 2018-08-19: qty 0.1

## 2018-08-19 NOTE — Progress Notes (Addendum)
PROGRESS NOTE                                                                                                                                                                                                             Patient Demographics:    Edwin [name], is a 53 y.o. male, DOB - May 22, 1965, JHE:[id number]  Outpatient Primary MD for the patient is Delorse Limber    LOS - 2  Chief Complaint  Patient presents with  . Fever       Brief Narrative: Patient is a 53 y.o. male with PMHx of DM, HTN, dyslipidemia, GERD presenting with acute hypoxic respiratory failure secondary to COVID-19 pneumonia.   Subjective:   No major issues overnight-remains stable just on 1-2 L.  Afebrile.  No nausea vomiting.   Assessment  & Plan :   Acute Hypoxic Resp Failure due to Covid 19 Viral pneumonia: Improvement continues-continue Solu-Medrol x3 days, and Remdesivir x5 days.  Since appears relatively stable-not a candidate for Actemra or convalescent plasma.  Inflammatory markers slowly downtrending.  COVID-19 Labs:  Recent Labs    08/16/18 2335 08/18/18 1015 08/19/18 0243  DDIMER 0.51* 0.41  --   FERRITIN 632* 918* 868*  LDH 303* 311* 301*  CRP 2.0* 1.6* 1.1*    Lab Results  Component Value Date   SARSCOV2NAA POSITIVE (A) 08/16/2018     COVID-19 Medications: 6/13>>Remdesivir 6/13>>Solumedrol  DM-2: CBGs on the higher side-as patient on steroids-start Lantus 10 units, continue SSI and follow.   HTN:  Controlled-continue amlodipine-follow.  Mild hyperkalemia: Lokelma x1-repeat electrolytes tomorrow  Dyslipidemia: Hold statins.   GERD: Continue PPI   ABG: No results found for: PHART, PCO2ART, PO2ART, HCO3, TCO2, ACIDBASEDEF, O2SAT  Condition - Stable  Family Communication  : None at bedside  Code Status :  Full Code  Diet :  Diet Order            Diet Heart Room service appropriate? Yes; Fluid  consistency: Thin  Diet effective now               Disposition Plan  :  Remain inpatient  Consults  : None  Procedures  :  None  DVT Prophylaxis  :  Lovenox  Lab Results  Component Value Date   PLT 303 08/19/2018    Inpatient Medications  Scheduled Meds: . amLODipine  5 mg Oral Daily  . aspirin EC  81 mg Oral Daily  . enoxaparin (LOVENOX) injection  0.5 mg/kg Subcutaneous Q24H  . insulin aspart  0-9 Units Subcutaneous TID WC  . methylPREDNISolone (SOLU-MEDROL) injection  40 mg Intravenous Q8H  . pantoprazole  40 mg Oral Daily  . sodium chloride flush  3 mL Intravenous Q12H  . vitamin C  500 mg Oral Daily  . zinc sulfate  220 mg Oral Daily   Continuous Infusions: . sodium chloride    . remdesivir 100 mg in NS 250 mL 100 mg (08/18/18 1419)   PRN Meds:.sodium chloride, acetaminophen, albuterol, chlorpheniramine-HYDROcodone, guaiFENesin-dextromethorphan, hydrALAZINE, ondansetron **OR** ondansetron (ZOFRAN) IV, polyethylene glycol, sodium chloride flush, traMADol  Antibiotics  :    Anti-infectives (From admission, onward)   Start     Dose/Rate Route Frequency Ordered Stop   08/18/18 1430  remdesivir 100 mg in sodium chloride 0.9 % 230 mL IVPB     100 mg 500 mL/hr over 30 Minutes Intravenous Every 24 hours 08/17/18 1308 08/22/18 1429   08/17/18 1430  remdesivir 200 mg in sodium chloride 0.9 % 210 mL IVPB     200 mg 500 mL/hr over 30 Minutes Intravenous Once 08/17/18 1308 08/17/18 1513       Time Spent in minutes  25   Oren Binet M.D on 08/19/2018 at 12:10 PM  To page go to [email or web address] - use universal password  [facility] -  Office  [phone]     Admit date - 08/16/2018    2    Objective:   Vitals:   08/18/18 2000 08/18/18 2347 08/19/18 0400 08/19/18 0754  BP: 113/83 118/83 113/78 110/77  Pulse: 83     Resp: 19     Temp: 99 F (37.2 C) 98.2 F (36.8 C) 98.2 F (36.8 C) 98.3 F (36.8 C)  TempSrc: Oral Oral Oral Oral  SpO2: 95%      Weight:      Height:        Wt Readings from Last 3 Encounters:  08/17/18 131.5 kg  07/29/18 (!) 136.5 kg  07/08/18 135.2 kg     Intake/Output Summary (Last 24 hours) at 08/19/2018 1210 Last data filed at 08/18/2018 1712 Gross per 24 hour  Intake 480 ml  Output -  Net 480 ml     Physical Exam General appearance:Awake, alert, not in any distress.  Eyes:no scleral icterus. HEENT: Atraumatic and Normocephalic Neck: supple, no JVD. Resp:Good air entry bilaterally,no rales or rhonchi CVS: S1 S2 regular, no murmurs.  GI: Bowel sounds present, Non tender and not distended with no gaurding, rigidity or rebound. Extremities: B/L Lower Ext shows no edema, both legs are warm to touch Neurology:  Non focal Psychiatric: Normal judgment and insight. Normal mood. Musculoskeletal:No digital cyanosis Skin:No Rash, warm and dry Wounds:N/A   Data Review:    CBC Recent Labs  Lab 08/16/18 2335 08/18/18 1015 08/19/18 0243  WBC 5.5 7.1 7.1  HGB 13.6 14.3 14.6  HCT 42.1 44.3 45.4  PLT 203 255 303  MCV 80.8 81.0 81.4  MCH 26.1 26.1 26.2  MCHC 32.3 32.3 32.2  RDW 13.0 13.1 13.2  LYMPHSABS 1.2  --   --   MONOABS 0.4  --   --   EOSABS 0.2  --   --   BASOSABS 0.0  --   --     Chemistries  Recent Labs  Lab 08/16/18 2335 08/18/18 1015 08/19/18 0243  NA 134* 137 138  K 3.6 4.4 5.7*  CL 100 101 99  CO2 23 26 26   GLUCOSE 162* 316* 320*  BUN 10 15 22*  CREATININE 1.00 0.95 0.98  CALCIUM 8.1* 8.4* 8.8*  AST 46* 46* 36  ALT 53* 62* 58*  ALKPHOS 66 72 69  BILITOT 0.7 0.6 0.3   ------------------------------------------------------------------------------------------------------------------ No results for input(s): CHOL, HDL, LDLCALC, TRIG, CHOLHDL, LDLDIRECT in the last 72 hours.  Lab Results  Component Value Date   HGBA1C 9.0 (H) 08/04/2018   ------------------------------------------------------------------------------------------------------------------ No results  for input(s): TSH, T4TOTAL, T3FREE, THYROIDAB in the last 72 hours.  Invalid input(s): FREET3 ------------------------------------------------------------------------------------------------------------------ Recent Labs    08/18/18 1015 08/19/18 0243  FERRITIN 918* 868*    Coagulation profile No results for input(s): INR, PROTIME in the last 168 hours.  Recent Labs    08/16/18 2335 08/18/18 1015  DDIMER 0.51* 0.41    Cardiac Enzymes No results for input(s): CKMB, TROPONINI, MYOGLOBIN in the last 168 hours.  Invalid input(s): CK ------------------------------------------------------------------------------------------------------------------    Component Value Date/Time   BNP 17.0 08/17/2018 1415    Micro Results Recent Results (from the past 240 hour(s))  SARS Coronavirus 2 (Hosp order,Performed in [facility] lab via [employer] ID)     Status: Abnormal   Collection Time: 08/16/18 11:35 PM   Specimen: Dry Nasal Swab ([facility]bbott ID Now)  Result Value Ref Range Status   SARS Coronavirus 2 (A[employer]ott ID Now) POSITIVE (A) NEGATIVE Final    Comment: RESULT CALLED TO, READ BACK BY AND VERIFIED WITH: MAYNARD,C, RN @ 0030 08/17/18 BY GWYN,P (NOTE) Interpretive Result Comment(s): COVID 19 Positive SARS CoV 2 target nucleic acids are DETECTED. The SARS CoV 2 RNA is generally detectable in upper and lower respiratory specimens during the acute phase of infection.  Positive results are indicative of active infection with SARS CoV 2.  Clinical correlation with patient history and other diagnostic information is necessary to determine patient infection status.  Positive results do not rule out bacterial infection or coinfection with other viruses. The expected result is Negative. COVID 19 Negative SARS CoV 2 target nucleic acids are NOT DETECTED. The SARS CoV 2 RNA is generally detectable in upper and lower respiratory specimens during the acute phase of infection.  Negative results do  not preclude SARS CoV 2 infection, do not rule out coinfections with other pathogens, and should not be used as the sole basis for treatme nt or other patient management decisions.  Negative results must be combined with clinical observations, patient history, and epidemiological information. The expected result is Negative. Invalid Presence or absence of SARS CoV 2 nucleic acids cannot be determined. Repeat testing was performed on the submitted specimen and repeated Invalid results were obtained.  If clinically indicated, additional testing on a new specimen with an alternate test methodology [phone]) is advised.  The SARS CoV 2 RNA is generally detectable in upper and lower respiratory specimens during the acute phase of infection. The expected result is Negative. Fact Sheet for Patients:  GolfingFamily.no Fact Sheet for Healthcare Providers: https://www.hernandez-brewer.com/ This test is not yet approved or cleared by the [place] FDA and has been authorized for detection and/or diagnosis of SARS CoV 2 by FDA under an Emergency Use Authorization (EUA).  This EU A will remain in effect (meaning this test can be used) for the duration of the COVID19 declaration under Section 564(b)(1) of the Act, 21 U.S.C. section 6814201257 3(b)(1), unless the authorization is terminated or revoked sooner.  Performed at [facility], [address] [place], [place] [address]     Radiology Reports Dg Chest Portable 1 View  Result Date: 08/16/2018 CLINICAL DATA:  Fever, shortness of breath EXAM: PORTABLE CHEST 1 VIEW COMPARISON:  None. FINDINGS: Low lung volumes. Patchy airspace opacities in the mid and lower lungs. Heart is borderline in size. No effusions or acute bony abnormality. IMPRESSION: Low lung volumes. Patchy bilateral mid and lower lung airspace opacities could reflect edema or infection. Atypical/viral infection not excluded.  Electronically Signed   By: Rolm Baptise M.D.   On: 08/16/2018 23:47

## 2018-08-20 LAB — COMPREHENSIVE METABOLIC PANEL
ALT: 55 U/L — ABNORMAL HIGH (ref 0–44)
AST: 26 U/L (ref 15–41)
Albumin: 3.7 g/dL (ref 3.5–5.0)
Alkaline Phosphatase: 72 U/L (ref 38–126)
Anion gap: 13 (ref 5–15)
BUN: 23 mg/dL — ABNORMAL HIGH (ref 6–20)
CO2: 25 mmol/L (ref 22–32)
Calcium: 8.9 mg/dL (ref 8.9–10.3)
Chloride: 100 mmol/L (ref 98–111)
Creatinine, Ser: 0.95 mg/dL (ref 0.61–1.24)
GFR calc Af Amer: >60 mL/min (ref >60)
GFR calc non Af Amer: >60 mL/min (ref >60)
Glucose, Bld: 379 mg/dL — ABNORMAL HIGH (ref 70–99)
Potassium: 4.8 mmol/L (ref 3.5–5.1)
Sodium: 138 mmol/L (ref 135–145)
Total Bilirubin: 0.6 mg/dL (ref 0.3–1.2)
Total Protein: 7.7 g/dL (ref 6.5–8.1)

## 2018-08-20 LAB — C-REACTIVE PROTEIN: CRP: <0.8 mg/dL (ref <1.0)

## 2018-08-20 LAB — CBC
HCT: 46.1 % (ref 39.0–52.0)
Hemoglobin: 14.7 g/dL (ref 13.0–17.0)
MCH: 25.8 pg — ABNORMAL LOW (ref 26.0–34.0)
MCHC: 31.9 g/dL (ref 30.0–36.0)
MCV: 80.9 fL (ref 80.0–100.0)
Platelets: 322 10*3/uL (ref 150–400)
RBC: 5.7 MIL/uL (ref 4.22–5.81)
RDW: 13 % (ref 11.5–15.5)
WBC: 6.5 10*3/uL (ref 4.0–10.5)
nRBC: 0 % (ref 0.0–0.2)

## 2018-08-20 LAB — GLUCOSE, CAPILLARY
Glucose-Capillary: 346 mg/dL — ABNORMAL HIGH (ref 70–99)
Glucose-Capillary: 308 mg/dL — ABNORMAL HIGH (ref 70–99)
Glucose-Capillary: 283 mg/dL — ABNORMAL HIGH (ref 70–99)

## 2018-08-20 LAB — LACTATE DEHYDROGENASE: LDH: 261 U/L — ABNORMAL HIGH (ref 98–192)

## 2018-08-20 LAB — FERRITIN: Ferritin: 774 ng/mL — ABNORMAL HIGH (ref 24–336)

## 2018-08-20 MED ORDER — INSULIN GLARGINE 100 UNIT/ML ~~LOC~~ SOLN
10.0000 [IU] | Freq: Two times a day (BID) | SUBCUTANEOUS | Status: DC
  Administered 2018-08-20: 10 [IU] via SUBCUTANEOUS
  Filled 2018-08-20 (×2): qty 0.1

## 2018-08-20 MED ORDER — METHYLPREDNISOLONE SODIUM SUCC 40 MG IJ SOLR
40.0000 mg | Freq: Three times a day (TID) | INTRAMUSCULAR | Status: DC
  Administered 2018-08-20 – 2018-08-21 (×4): 40 mg via INTRAVENOUS
  Filled 2018-08-20 (×5): qty 1

## 2018-08-20 MED ORDER — INSULIN ASPART 100 UNIT/ML ~~LOC~~ SOLN
0.0000 [IU] | Freq: Every day | SUBCUTANEOUS | Status: DC
  Administered 2018-08-20: 3 [IU] via SUBCUTANEOUS

## 2018-08-20 MED ORDER — INSULIN ASPART 100 UNIT/ML ~~LOC~~ SOLN
8.0000 [IU] | Freq: Three times a day (TID) | SUBCUTANEOUS | Status: DC
  Administered 2018-08-20 – 2018-08-21 (×3): 8 [IU] via SUBCUTANEOUS

## 2018-08-20 MED ORDER — INSULIN ASPART 100 UNIT/ML ~~LOC~~ SOLN
4.0000 [IU] | Freq: Three times a day (TID) | SUBCUTANEOUS | Status: DC
  Administered 2018-08-20 (×2): 4 [IU] via SUBCUTANEOUS

## 2018-08-20 MED ORDER — INSULIN ASPART 100 UNIT/ML ~~LOC~~ SOLN
0.0000 [IU] | Freq: Three times a day (TID) | SUBCUTANEOUS | Status: DC
  Administered 2018-08-20: 20 [IU] via SUBCUTANEOUS
  Administered 2018-08-20 – 2018-08-21 (×3): 15 [IU] via SUBCUTANEOUS
  Administered 2018-08-21: 20 [IU] via SUBCUTANEOUS

## 2018-08-20 MED ORDER — INSULIN GLARGINE 100 UNIT/ML ~~LOC~~ SOLN
20.0000 [IU] | Freq: Two times a day (BID) | SUBCUTANEOUS | Status: DC
  Administered 2018-08-20 – 2018-08-21 (×2): 20 [IU] via SUBCUTANEOUS
  Filled 2018-08-20 (×2): qty 0.2

## 2018-08-20 NOTE — Progress Notes (Signed)
PROGRESS NOTE                                                                                                                                                                                                             Patient Demographics:    Edwin [name], is a 53 y.o. male, DOB - 03-14-1965, EKC:[id number]  Outpatient Primary MD for the patient is Delorse Limber    LOS - 3  Chief Complaint  Patient presents with  . Fever       Brief Narrative: Patient is a 53 y.o. male with PMHx of DM, HTN, dyslipidemia, GERD presenting with acute hypoxic respiratory failure secondary to COVID-19 pneumonia.   Subjective:   No major issues overnight, reports dyspnea has improved, he is on room air overnight.   Assessment  & Plan :   Acute Hypoxic Resp Failure due to Covid 19 Viral pneumonia: -Respiratory failure continues to improve, he is on room air over last 24 hours -Continue with IV Solu-Medrol. -Continue with IV Remdesivir, tomorrow will be day #5. -Since appears relatively stable-not a candidate for Actemra or convalescent plasma.  Inflammatory markers slowly downtrending.  COVID-19 Labs:  Recent Labs    08/18/18 1015 08/19/18 0243 08/20/18 0305  DDIMER 0.41  --   --   FERRITIN 918* 868* 774*  LDH 311* 301* 261*  CRP 1.6* 1.1* <0.8    Lab Results  Component Value Date   SARSCOV2NAA POSITIVE (A) 08/16/2018     COVID-19 Medications: 6/13>>Remdesivir 6/13>>Solumedrol  DM-2: Old poorly controlled, most likely in the setting of steroids, as well his A1c is elevated at night, I have increased his Lantus to 20 units twice daily, added 8 units NovoLog before meals, change sliding scale to resistant, added carb modified diet .  HTN:  Controlled-continue amlodipine-follow.  Mild hyperkalemia: Lokelma x1, potassium is 4.8 today.  Dyslipidemia: Hold statins.   GERD: Continue PPI   ABG: No results  found for: PHART, PCO2ART, PO2ART, HCO3, TCO2, ACIDBASEDEF, O2SAT  Condition - Stable  Family Communication  : None at bedside  Code Status :  Full Code  Diet :  Diet Order            Diet Heart Room service appropriate? Yes; Fluid consistency: Thin  Diet effective now  Disposition Plan  :  Remain inpatient  Consults  : None  Procedures  :  None  DVT Prophylaxis  :  Lovenox  Lab Results  Component Value Date   PLT 322 08/20/2018    Inpatient Medications  Scheduled Meds: . amLODipine  5 mg Oral Daily  . aspirin EC  81 mg Oral Daily  . enoxaparin (LOVENOX) injection  0.5 mg/kg Subcutaneous Q24H  . insulin aspart  0-20 Units Subcutaneous TID WC  . insulin aspart  0-5 Units Subcutaneous QHS  . insulin aspart  4 Units Subcutaneous TID WC  . insulin glargine  10 Units Subcutaneous BID  . methylPREDNISolone (SOLU-MEDROL) injection  40 mg Intravenous Q8H  . pantoprazole  40 mg Oral Daily  . sodium chloride flush  3 mL Intravenous Q12H  . vitamin C  500 mg Oral Daily  . zinc sulfate  220 mg Oral Daily   Continuous Infusions: . sodium chloride    . remdesivir 100 mg in NS 250 mL 100 mg (08/20/18 1335)   PRN Meds:.sodium chloride, acetaminophen, albuterol, chlorpheniramine-HYDROcodone, guaiFENesin-dextromethorphan, hydrALAZINE, ondansetron **OR** ondansetron (ZOFRAN) IV, polyethylene glycol, sodium chloride flush, traMADol  Antibiotics  :    Anti-infectives (From admission, onward)   Start     Dose/Rate Route Frequency Ordered Stop   08/18/18 1430  remdesivir 100 mg in sodium chloride 0.9 % 230 mL IVPB     100 mg 500 mL/hr over 30 Minutes Intravenous Every 24 hours 08/17/18 1308 08/22/18 1429   08/17/18 1430  remdesivir 200 mg in sodium chloride 0.9 % 210 mL IVPB     200 mg 500 mL/hr over 30 Minutes Intravenous Once 08/17/18 1308 08/17/18 1513        Admit date - 08/16/2018    3    Objective:   Vitals:   08/20/18 0439 08/20/18 0806 08/20/18  0900 08/20/18 1231  BP: (!) 126/94 109/84  120/89  Pulse: 74  81 88  Resp: 18  19 19   Temp: 97.9 F (36.6 C) 98.1 F (36.7 C)  98 F (36.7 C)  TempSrc: Oral Oral  Oral  SpO2: 96%  96% 94%  Weight:      Height:        Wt Readings from Last 3 Encounters:  08/17/18 131.5 kg  07/29/18 (!) 136.5 kg  07/08/18 135.2 kg     Intake/Output Summary (Last 24 hours) at 08/20/2018 1444 Last data filed at 08/20/2018 1300 Gross per 24 hour  Intake 963 ml  Output -  Net 963 ml     Physical Exam  Awake Alert, Oriented X 3, No new F.N deficits, Normal affect Symmetrical Chest wall movement, Good air movement bilaterally, CTAB RRR,No Gallops,Rubs or new Murmurs, No Parasternal Heave +ve B.Sounds, Abd Soft, No tenderness, No rebound - guarding or rigidity. No Cyanosis, Clubbing or edema, No new Rash or bruise     Data Review:    CBC Recent Labs  Lab 08/16/18 2335 08/18/18 1015 08/19/18 0243 08/20/18 0305  WBC 5.5 7.1 7.1 6.5  HGB 13.6 14.3 14.6 14.7  HCT 42.1 44.3 45.4 46.1  PLT 203 255 303 322  MCV 80.8 81.0 81.4 80.9  MCH 26.1 26.1 26.2 25.8*  MCHC 32.3 32.3 32.2 31.9  RDW 13.0 13.1 13.2 13.0  LYMPHSABS 1.2  --   --   --   MONOABS 0.4  --   --   --   EOSABS 0.2  --   --   --   BASOSABS  0.0  --   --   --     Chemistries  Recent Labs  Lab 08/16/18 2335 08/18/18 1015 08/19/18 0243 08/20/18 0305  NA 134* 137 138 138  K 3.6 4.4 5.7* 4.8  CL 100 101 99 100  CO2 23 26 26 25   GLUCOSE 162* 316* 320* 379*  BUN 10 15 22* 23*  CREATININE 1.00 0.95 0.98 0.95  CALCIUM 8.1* 8.4* 8.8* 8.9  AST 46* 46* 36 26  ALT 53* 62* 58* 55*  ALKPHOS 66 72 69 72  BILITOT 0.7 0.6 0.3 0.6   ------------------------------------------------------------------------------------------------------------------ No results for input(s): CHOL, HDL, LDLCALC, TRIG, CHOLHDL, LDLDIRECT in the last 72 hours.  Lab Results  Component Value Date   HGBA1C 9.0 (H) 08/04/2018    ------------------------------------------------------------------------------------------------------------------ No results for input(s): TSH, T4TOTAL, T3FREE, THYROIDAB in the last 72 hours.  Invalid input(s): FREET3 ------------------------------------------------------------------------------------------------------------------ Recent Labs    08/19/18 0243 08/20/18 0305  FERRITIN 868* 774*    Coagulation profile No results for input(s): INR, PROTIME in the last 168 hours.  Recent Labs    08/18/18 1015  DDIMER 0.41    Cardiac Enzymes No results for input(s): CKMB, TROPONINI, MYOGLOBIN in the last 168 hours.  Invalid input(s): CK ------------------------------------------------------------------------------------------------------------------    Component Value Date/Time   BNP 17.0 08/17/2018 1415    Micro Results Recent Results (from the past 240 hour(s))  SARS Coronavirus 2 (Hosp order,Performed in [facility] lab via [employer] ID)     Status: Abnormal   Collection Time: 08/16/18 11:35 PM   Specimen: Dry Nasal Swab ([employer] [facility] Now)  Result Value Ref Range Status   SARS Coronavirus 2 ([employer] Now) POSITIVE (A) NEGATIVE Final    Comment: RESULT CALLED TO, READ BACK BY AND VERIFIED WITH: MAYNARD,C, RN @ 0030 08/17/18 BY GWYN,P (NOTE) Interpretive Result Comment(s): COVID 19 Positive SARS CoV 2 target nucleic acids are DETECTED. The SARS CoV 2 RNA is generally detectable in upper and lower respiratory specimens during the acute phase of infection.  Positive results are indicative of active infection with SARS CoV 2.  Clinical correlation with patient history and other diagnostic information is necessary to determine patient infection status.  Positive results do not rule out bacterial infection or coinfection with other viruses. The expected result is Negative. COVID 19 Negative SARS CoV 2 target nucleic acids are NOT DETECTED. The SARS CoV 2 RNA is generally  detectable in upper and lower respiratory specimens during the acute phase of infection.  Negative results do not preclude SARS CoV 2 infection, do not rule out coinfections with other pathogens, and should not be used as the sole basis for treatme nt or other patient management decisions.  Negative results must be combined with clinical observations, patient history, and epidemiological information. The expected result is Negative. Invalid Presence or absence of SARS CoV 2 nucleic acids cannot be determined. Repeat testing was performed on the submitted specimen and repeated Invalid results were obtained.  If clinically indicated, additional testing on a new specimen with an alternate test methodology [phone]) is advised.  The SARS CoV 2 RNA is generally detectable in upper and lower respiratory specimens during the acute phase of infection. The expected result is Negative. Fact Sheet for Patients:  GolfingFamily.no Fact Sheet for Healthcare Providers: https://www.hernandez-brewer.com/ This test is not yet approved or cleared by the [place] FDA and has been authorized for detection and/or diagnosis of SARS CoV 2 by FDA under an Emergency Use Authorization (EUA).  This EU  A will remain in effect (meaning this test can be used) for the duration of the COVID19 declaration under Section 564(b)(1) of the Act, 21 U.S.C. section 7573564481 3(b)(1), unless the authorization is terminated or revoked sooner. Performed at [facility], [address] [place], [place] [address]     Radiology Reports Dg Chest Portable 1 View  Result Date: 08/16/2018 CLINICAL DATA:  Fever, shortness of breath EXAM: PORTABLE CHEST 1 VIEW COMPARISON:  None. FINDINGS: Low lung volumes. Patchy airspace opacities in the mid and lower lungs. Heart is borderline in size. No effusions or acute bony abnormality. IMPRESSION: Low lung volumes. Patchy bilateral mid and  lower lung airspace opacities could reflect edema or infection. Atypical/viral infection not excluded. Electronically Signed   By: Rolm Baptise M.D.   On: 08/16/2018 23:47     Time Spent in minutes  25   Phillips Climes M.D on 08/20/2018 at 2:44 PM  To page go to [email or web address] - use universal password  [facility] -  Office  [phone]

## 2018-08-20 NOTE — Progress Notes (Signed)
Inpatient Diabetes Program Recommendations  AACE/ADA: New Consensus Statement on Inpatient Glycemic Control (2015)  Target Ranges:  Prepandial:   less than 140 mg/dL      Peak postprandial:   less than 180 mg/dL (1-2 hours)      Critically ill patients:  140 - 180 mg/dL   Lab Results  Component Value Date   GLUCAP 408 (H) 08/19/2018   HGBA1C 9.0 (H) 08/04/2018    Review of Glycemic Control  CBGs past 24H - 277-408. Lab glucose this am - 379 mg/dL  Needs insulin titration. On Solumedrol 40 mg Q8H. HgbA1C - 9.0% - uncontrolled  Inpatient Diabetes Program Recommendations:     Add Levemir 20 units bid (D/C Lantus) Increase Novolog to 8 units tidwc for meal coverage insulin Add CHO mod med to heart healthy diet  Will speak with pt about his HgbA1C of 9% and importance of controlling blood sugars at home to prevent complications.  Follow glucose trends.  Thank you. Lorenda Peck, RD, LDN, CDE Inpatient Diabetes Coordinator [phone]

## 2018-08-21 LAB — CBC
HCT: 46.7 % (ref 39.0–52.0)
Hemoglobin: 15.2 g/dL (ref 13.0–17.0)
MCH: 26.2 pg (ref 26.0–34.0)
MCHC: 32.5 g/dL (ref 30.0–36.0)
MCV: 80.4 fL (ref 80.0–100.0)
Platelets: 377 10*3/uL (ref 150–400)
RBC: 5.81 MIL/uL (ref 4.22–5.81)
RDW: 13 % (ref 11.5–15.5)
WBC: 7.9 10*3/uL (ref 4.0–10.5)
nRBC: 0 % (ref 0.0–0.2)

## 2018-08-21 LAB — COMPREHENSIVE METABOLIC PANEL
ALT: 46 U/L — ABNORMAL HIGH (ref 0–44)
AST: 18 U/L (ref 15–41)
Albumin: 3.5 g/dL (ref 3.5–5.0)
Alkaline Phosphatase: 71 U/L (ref 38–126)
Anion gap: 8 (ref 5–15)
BUN: 23 mg/dL — ABNORMAL HIGH (ref 6–20)
CO2: 27 mmol/L (ref 22–32)
Calcium: 9 mg/dL (ref 8.9–10.3)
Chloride: 102 mmol/L (ref 98–111)
Creatinine, Ser: 0.88 mg/dL (ref 0.61–1.24)
GFR calc Af Amer: >60 mL/min (ref >60)
GFR calc non Af Amer: >60 mL/min (ref >60)
Glucose, Bld: 196 mg/dL — ABNORMAL HIGH (ref 70–99)
Potassium: 4.8 mmol/L (ref 3.5–5.1)
Sodium: 137 mmol/L (ref 135–145)
Total Bilirubin: 0.3 mg/dL (ref 0.3–1.2)
Total Protein: 7.7 g/dL (ref 6.5–8.1)

## 2018-08-21 LAB — FERRITIN: Ferritin: 675 ng/mL — ABNORMAL HIGH (ref 24–336)

## 2018-08-21 LAB — LACTATE DEHYDROGENASE: LDH: 241 U/L — ABNORMAL HIGH (ref 98–192)

## 2018-08-21 LAB — C-REACTIVE PROTEIN: CRP: <0.8 mg/dL (ref <1.0)

## 2018-08-21 LAB — GLUCOSE, CAPILLARY
Glucose-Capillary: 361 mg/dL — ABNORMAL HIGH (ref 70–99)
Glucose-Capillary: 322 mg/dL — ABNORMAL HIGH (ref 70–99)
Glucose-Capillary: 364 mg/dL — ABNORMAL HIGH (ref 70–99)

## 2018-08-21 MED ORDER — ACETAMINOPHEN 325 MG PO TABS: 650.0000 mg | ORAL_TABLET | Freq: Four times a day (QID) | ORAL | Status: DC | PRN

## 2018-08-21 MED ORDER — ZINC SULFATE 220 (50 ZN) MG PO CAPS: 220.0000 mg | ORAL_CAPSULE | Freq: Every day | ORAL | Status: DC

## 2018-08-21 MED ORDER — ASCORBIC ACID 500 MG PO TABS: 500.0000 mg | ORAL_TABLET | Freq: Every day | ORAL | Status: AC

## 2018-08-21 MED ORDER — PREDNISONE 10 MG (21) PO TBPK: ORAL_TABLET | ORAL | 0 refills | Status: DC

## 2018-08-21 NOTE — Discharge Summary (Signed)
Edwin [name], is a 53 y.o. male  DOB 1965-04-03  MRN [id number].  Admission date:  08/16/2018  Admitting Physician  Samuella Cota, MD  Discharge Date:  08/21/2018   Primary MD  Brunetta Jeans, PA-C  Recommendations for primary care physician for things to follow:  - Please Check CBC, BMP, LFTs during next visit   Admission Diagnosis  COVID-19 [U07.1, J98.8]   Discharge Diagnosis  COVID-19 [U07.1, J98.8]   Principal Problem:   Pneumonia due to COVID-19 virus Active Problems:   Diabetes mellitus type II, controlled ([place])   Hyperlipidemia   Essential hypertension, benign   Acute respiratory failure with hypoxia [facility])      Past Medical History:  Diagnosis Date  . Allergy   . DM (diabetes mellitus) ([place])    type 2  . High cholesterol   . Hypertension   . Low testosterone     Past Surgical History:  Procedure Laterality Date  . COLONOSCOPY  10 years ago   at [facility] "normal" exam  . NASAL SINUS SURGERY  2010  . UPPER GASTROINTESTINAL ENDOSCOPY  10 years ago  . WISDOM TOOTH EXTRACTION         History of present illness and  Hospital Course:     Kindly see H&P for history of present illness and admission details, please review complete Labs, Consult reports and Test reports for all details in brief  HPI  from the history and physical done on the day of admission 08/16/2018  Edwin [name] is a 53 y.o. male with medical history significant of DM, HTN, dyslipidemia, GERD-who presented to the hospital for the above-noted complaints.  Per patient-since this past Friday-he noted low-grade fever.  Edwin [name] started having high-grade fever and noted that he was short of breath on ambulation.  He also developed a dry cough.  He subsequently presented to the emergency room-where he was noted to have O2 saturation of around 88, a chest x-ray confirmed bilateral patchy infiltrates.   He subsequently tested positive for COVID-19.  Patient was then transferred to the hospitalist service.  Patient denies any nausea, vomiting or diarrhea. Denies any headache, chest pain.  He denies any shortness of breath at rest.  ED Course:  Found to have COVID-19-x-ray with patchy interstitial pneumonia-given 1 dose of Solu-Medrol.  Subsequently transferred to [facility] service at [facility].  Hospital Course  Patient is a 53 y.o. male with PMHx of DM, HTN, dyslipidemia, GERD presenting with acute hypoxic respiratory failure secondary to COVID-19 pneumonia.  Acute Hypoxic Resp Failure due to Covid 19 Viral pneumonia: -This has resolved, he is on room air for 48 hours -Treated with IV Solu-Medrol, he will be discharged on prednisone taper -Give 5 days of IV Remdesivir -Inflammatory markers continue to trend down, CRP has normalized by the time of discharge  COVID-19 Labs:  Recent Labs (last 2 labs)        Recent Labs    08/18/18 1015 08/19/18 0243 08/20/18 0305  DDIMER 0.41  --   --  FERRITIN 918* 868* 774*  LDH 311* 301* 261*  CRP 1.6* 1.1* <0.8      Recent Labs       Lab Results  Component Value Date   SARSCOV2NAA POSITIVE (A) 08/16/2018       COVID-19 Medications: 6/13>>Remdesivir 6/13>>Solumedrol  DM-2:  Overall poorly controlled, most likely to steroids, I would anticipate it will improve after he finished his steroids, will resume home medication on discharge  HTN:  Resume home medication on discharge  Mild hyperkalemia:  Received Lokelma, potassium is 4.8 on discharge  Dyslipidemia:Resume home medication on discharge  GERD: Continue PPI    Discharge Condition: stable   Follow UP  Follow-up Information    Brunetta Jeans, PA-C. Schedule an appointment as soon as possible for a visit today.   Specialty: Family Medicine Why: electronically Contact information: [address] [place] [address] [phone]              Discharge Instructions  and  Discharge Medications     Discharge Instructions    Discharge instructions   Complete by: As directed    Follow with Primary MD Brunetta Jeans, PA-C in 7 days   Get CBC, CMP,  checked  by Primary MD next visit.    Activity: As tolerated with Full fall precautions use walker/cane & assistance as needed   Disposition Home    Diet: Heart Healthy ,Carb modified , with feeding assistance and aspiration precautions.  For Heart failure patients - Check your Weight same time everyday, if you gain over 2 pounds, or you develop in leg swelling, experience more shortness of breath or chest pain, call your Primary MD immediately. Follow Cardiac Low Salt Diet and 1.5 lit/day fluid restriction.   On your next visit with your primary care physician please Get Medicines reviewed and adjusted.   Please request your Prim.MD to go over all Hospital Tests and Procedure/Radiological results at the follow up, please get all Hospital records sent to your Prim MD by signing hospital release before you go home.   If you experience worsening of your admission symptoms, develop shortness of breath, life threatening emergency, suicidal or homicidal thoughts you must seek medical attention immediately by calling 911 or calling your MD immediately  if symptoms less severe.  You Must read complete instructions/literature along with all the possible adverse reactions/side effects for all the Medicines you take and that have been prescribed to you. Take any new Medicines after you have completely understood and accpet all the possible adverse reactions/side effects.   Do not drive, operating heavy machinery, perform activities at heights, swimming or participation in water activities or provide baby sitting services if your were admitted for syncope or siezures until you have seen by Primary MD or a Neurologist and advised to do so again.  Do not drive when taking Pain medications.     Do not take more than prescribed Pain, Sleep and Anxiety Medications  Special Instructions: If you have smoked or chewed Tobacco  in the last 2 yrs please stop smoking, stop any regular Alcohol  and or any Recreational drug use.  Wear Seat belts while driving.   Please note  You were cared for by a hospitalist during your hospital stay. If you have any questions about your discharge medications or the care you received while you were in the hospital after you are discharged, you can call the unit and asked to speak with the hospitalist on call if  the hospitalist that took care of you is not available. Once you are discharged, your primary care physician will handle any further medical issues. Please note that NO REFILLS for any discharge medications will be authorized once you are discharged, as it is imperative that you return to your primary care physician (or establish a relationship with a primary care physician if you do not have one) for your aftercare needs so that they can reassess your need for medications and monitor your lab values.   Increase activity slowly   Complete by: As directed    MyChart COVID-19 home monitoring program   Complete by: Aug 21, 2018    Is the patient willing to use the [place] for home monitoring?: Yes   Temperature monitoring   Complete by: Aug 21, 2018    After how many days would you like to receive a notification of this patient's flowsheet entries?: 1     Allergies as of 08/21/2018      Reactions   Avelox [moxifloxacin Hcl In Nacl] Hives, Itching   Doxycycline Rash      Medication List    STOP taking these medications   ibuprofen 600 MG tablet Commonly known as: ADVIL   triamcinolone cream 0.1 % Commonly known as: KENALOG     TAKE these medications   acetaminophen 325 MG tablet Commonly known as: TYLENOL Take 2 tablets (650 mg total) by mouth every 6 (six) hours as needed for mild pain or headache (fever >/= 101).    amLODipine-atorvastatin 5-10 MG tablet Commonly known as: CADUET Take 1 tablet by mouth daily.   ascorbic acid 500 MG tablet Commonly known as: VITAMIN C Take 1 tablet (500 mg total) by mouth daily. Please take for 2 weeks Start taking on: August 22, 2018   aspirin EC 81 MG tablet Take 1 tablet by mouth daily.   atorvastatin 20 MG tablet Commonly known as: LIPITOR Take 1 tablet (20 mg total) by mouth daily.   fluticasone 50 MCG/ACT nasal spray Commonly known as: FLONASE Place 2 sprays into both nostrils daily as needed for allergies or rhinitis.   glucose blood test strip Use as instructed daily Dx: E11.9   metFORMIN 1000 MG tablet Commonly known as: GLUCOPHAGE TAKE 1 TABLET BY MOUTH TWICE DAILY WITH A MEAL What changed: See the new instructions.   multivitamin Tabs tablet Take 1 tablet by mouth daily.   OneTouch Delica Lancets 63W Misc 1 Stick by Does not apply route daily. Dx:E11.9   OneTouch Verio Flex System w/Device Kit 1 Units by Does not apply route daily. Dx:E11.9   predniSONE 10 MG (21) Tbpk tablet Commonly known as: STERAPRED UNI-PAK 21 TAB Please use per package insrtuction   sitaGLIPtin 100 MG tablet Commonly known as: JANUVIA Take 1 tablet (100 mg total) by mouth daily.   zinc sulfate 220 (50 Zn) MG capsule Take 1 capsule (220 mg total) by mouth daily. Please take for 2 weeks Start taking on: August 22, 2018         Diet and Activity recommendation: See Discharge Instructions above   Consults obtained - None   Major procedures and Radiology Reports - PLEASE review detailed and final reports for all details, in brief -     Dg Chest Portable 1 View  Result Date: 08/16/2018 CLINICAL DATA:  Fever, shortness of breath EXAM: PORTABLE CHEST 1 VIEW COMPARISON:  None. FINDINGS: Low lung volumes. Patchy airspace opacities in the mid and lower lungs. Heart is borderline  in size. No effusions or acute bony abnormality. IMPRESSION: Low lung volumes.  Patchy bilateral mid and lower lung airspace opacities could reflect edema or infection. Atypical/viral infection not excluded. Electronically Signed   By: Rolm Baptise M.D.   On: 08/16/2018 23:47    Micro Results    Recent Results (from the past 240 hour(s))  SARS Coronavirus 2 (Hosp order,Performed in [facility] lab via [employer])     Status: Abnormal   Collection Time: 08/16/18 11:35 PM   Specimen: Dry Nasal Swab ([employer] Now)  Result Value Ref Range Status   SARS Coronavirus 2 ([employer] Now) POSITIVE (A) NEGATIVE Final    Comment: RESULT CALLED TO, READ BACK BY AND VERIFIED WITH: MAYNARD,C, RN @ 0030 08/17/18 BY GWYN,P (NOTE) Interpretive Result Comment(s): COVID 19 Positive SARS CoV 2 target nucleic acids are DETECTED. The SARS CoV 2 RNA is generally detectable in upper and lower respiratory specimens during the acute phase of infection.  Positive results are indicative of active infection with SARS CoV 2.  Clinical correlation with patient history and other diagnostic information is necessary to determine patient infection status.  Positive results do not rule out bacterial infection or coinfection with other viruses. The expected result is Negative. COVID 19 Negative SARS CoV 2 target nucleic acids are NOT DETECTED. The SARS CoV 2 RNA is generally detectable in upper and lower respiratory specimens during the acute phase of infection.  Negative results do not preclude SARS CoV 2 infection, do not rule out coinfections with other pathogens, and should not be used as the sole basis for treatme nt or other patient management decisions.  Negative results must be combined with clinical observations, patient history, and epidemiological information. The expected result is Negative. Invalid Presence or absence of SARS CoV 2 nucleic acids cannot be determined. Repeat testing was performed on the submitted specimen and repeated Invalid results were obtained.  If clinically  indicated, additional testing on a new specimen with an alternate test methodology [phone]) is advised.  The SARS CoV 2 RNA is generally detectable in upper and lower respiratory specimens during the acute phase of infection. The expected result is Negative. Fact Sheet for Patients:  GolfingFamily.no Fact Sheet for Healthcare Providers: https://www.hernandez-brewer.com/ This test is not yet approved or cleared by the [place] FDA and has been authorized for detection and/or diagnosis of SARS CoV 2 by FDA under an Emergency Use Authorization (EUA).  This EU A will remain in effect (meaning this test can be used) for the duration of the COVID19 declaration under Section 564(b)(1) of the Act, 21 U.S.C. section [phone] 3(b)(1), unless the authorization is terminated or revoked sooner. Performed at [facility], [address] [place], [place] [address]        Today   Subjective:   Edwin [name] today has no headache,no chest abdominal pain,no new weakness tingling or numbness, feels much better wants to go home today.   Objective:   Blood pressure 119/75, pulse 74, temperature 98.1 F (36.7 C), temperature source Oral, resp. rate 18, height _0  (1.803 m), weight 131.5 kg, SpO2 97 %.   Intake/Output Summary (Last 24 hours) at 08/21/2018 1657 Last data filed at 08/21/2018 0830 Gross per 24 hour  Intake 480 ml  Output -  Net 480 ml    Exam Awake Alert, Oriented x 3, No new F.N deficits, Normal affect Symmetrical Chest wall movement, Good air movement bilaterally, CTAB RRR,No Gallops,Rubs or new Murmurs, No  Parasternal Heave +ve B.Sounds, Abd Soft, Non tender,No rebound -guarding or rigidity. No Cyanosis, Clubbing or edema, No new Rash or bruise  Data Review   CBC w Diff:  Lab Results  Component Value Date   WBC 7.9 08/21/2018   HGB 15.2 08/21/2018   HCT 46.7 08/21/2018   PLT 377 08/21/2018   LYMPHOPCT 22  08/16/2018   MONOPCT 7 08/16/2018   EOSPCT 3 08/16/2018   BASOPCT 0 08/16/2018    CMP:  Lab Results  Component Value Date   NA 137 08/21/2018   K 4.8 08/21/2018   CL 102 08/21/2018   CO2 27 08/21/2018   BUN 23 (H) 08/21/2018   CREATININE 0.88 08/21/2018   CREATININE 1.11 07/24/2013   PROT 7.7 08/21/2018   ALBUMIN 3.5 08/21/2018   BILITOT 0.3 08/21/2018   ALKPHOS 71 08/21/2018   AST 18 08/21/2018   ALT 46 (H) 08/21/2018  .   Total Time in preparing paper work, data evaluation and todays exam - 69 minutes  Phillips Climes M.D on 08/21/2018 at 4:57 PM  [facility]   Office  [phone]

## 2018-08-21 NOTE — Progress Notes (Signed)
Patient given discharge instructions, and verbalized an understanding of all discharge instructions.  Patient agrees with discharge plan, and is being discharged in stable medical condition.  Patient given transportation via wheelchair. 

## 2018-08-22 ENCOUNTER — Telehealth: Payer: Self-pay

## 2018-08-22 NOTE — Telephone Encounter (Signed)
Transition Care Management Follow-up Telephone Call  Admission date:  08/16/2018   Discharge Date:  08/21/2018  Principal Problem: Pneumonia due to COVID-19 virus   How have you been since you were released from the hospital? "I'm resting"   Do you understand why you were in the hospital? yes   Do you understand the discharge instructions? Yes. Pt and wife self isolating x 7 days per d/c instructions.    Where were you discharged to? Home. Resides with wife.    Items Reviewed:  Medications reviewed: yes  Allergies reviewed: yes  Dietary changes reviewed: yes  Referrals reviewed: yes   Functional Questionnaire:   Activities of Daily Living (ADLs):   He states they are independent in the following: ambulation, bathing and hygiene, feeding, continence, grooming, toileting and dressing States they require assistance with the following: None.    Any transportation issues/concerns?: no   Any patient concerns? no   Confirmed importance and date/time of follow-up visits scheduled yes  Provider Appointment booked with PCP 08/26/2018 via VV.   Confirmed with patient if condition begins to worsen call PCP or go to the ER.  Patient was given the office number and encouraged to call back with question or concerns.  : yes

## 2018-08-26 ENCOUNTER — Encounter: Payer: Self-pay | Admitting: Physician Assistant

## 2018-08-26 ENCOUNTER — Ambulatory Visit (INDEPENDENT_AMBULATORY_CARE_PROVIDER_SITE_OTHER): Payer: Commercial Managed Care - PPO | Admitting: Physician Assistant

## 2018-08-26 ENCOUNTER — Other Ambulatory Visit: Payer: Self-pay | Admitting: Emergency Medicine

## 2018-08-26 VITALS — BP 105/70 | HR 98 | Temp 97.1°F | Ht 71.0 in | Wt 295.0 lb

## 2018-08-26 DIAGNOSIS — J1282 Pneumonia due to coronavirus disease 2019: Secondary | ICD-10-CM

## 2018-08-26 DIAGNOSIS — J1289 Other viral pneumonia: Secondary | ICD-10-CM | POA: Diagnosis not present

## 2018-08-26 DIAGNOSIS — U071 COVID-19: Secondary | ICD-10-CM | POA: Diagnosis not present

## 2018-08-26 DIAGNOSIS — I10 Essential (primary) hypertension: Secondary | ICD-10-CM

## 2018-08-26 DIAGNOSIS — J9601 Acute respiratory failure with hypoxia: Secondary | ICD-10-CM

## 2018-08-26 DIAGNOSIS — E785 Hyperlipidemia, unspecified: Secondary | ICD-10-CM

## 2018-08-26 MED ORDER — AMLODIPINE-ATORVASTATIN 5-10 MG PO TABS: 1.0000 | ORAL_TABLET | Freq: Every day | ORAL | 0 refills | Status: DC

## 2018-08-26 NOTE — Progress Notes (Signed)
I have discussed the procedure for the virtual visit with the patient who has given consent to proceed with assessment and treatment.   Edwin [name], CMA     

## 2018-08-26 NOTE — Progress Notes (Signed)
Virtual Visit via Video   I connected with patient on 08/26/18 at  1:00 PM EDT by a video enabled telemedicine application and verified that I am speaking with the correct person using two identifiers.  Location patient: Home Location provider: [facility], Office Persons participating in the virtual visit: Patient, Provider, [place] (Patina Moore)  I discussed the limitations of evaluation and management by telemedicine and the availability of in person appointments. The patient expressed understanding and agreed to proceed.  Subjective:   HPI:   Patient presents today via [employer]me today for hospital follow-up. Patient presented to ER on 08/16/2018 with c/o fever, cough and dyspnea. Was found to be hypoxic on RA with saturations max 88%. ER workup included + COVID test and CXR revealing bilateral viral pneumonia. Was given 1 dose of Solumedrol and admitted to hospital for further management.   During admission, patient was continued on IV Solumedrol and IV Remdesivir. Inflammatory markers improved during hospitalization with CRP normalizing by time of discharge. Patient was discharged on 08/21/18 to complete a steroid taper.   Since discharge, patient endorses taking his steroid taper as directed. Notes feeling well overall. Is able to do more things around the house as he is feeling stronger (85%). Appetite present eating well. Staying well hydrated with water, G2, and occasional soda. Denies cough, SOB, fever, chills, diarrhea. Tomorrow last day of steroid. Notes his BG:  340 today, currently on prednisone. Has not been minimizing carbs. Tolerating steroids well denies any issues    ROS:   See pertinent positives and negatives per HPI.  Patient Active Problem List   Diagnosis Date Noted  . Pneumonia due to COVID-19 virus 08/17/2018  . COVID-19 virus infection 08/17/2018  . Acute respiratory failure with hypoxia ([place]) 08/17/2018  . Seborrheic keratosis 04/15/2017  . PVC's  (premature ventricular contractions) 09/03/2014  . Essential hypertension, benign 11/03/2013  . Diabetes mellitus type II, controlled ([place]) 08/03/2013  . Hyperlipidemia 08/03/2013  . Low serum testosterone level 08/03/2013  . Prostate cancer screening 08/03/2013  . Visit for preventive health examination 08/03/2013    Social History   Tobacco Use  . Smoking status: Former Smoker    Packs/day: 0.50    Years: 15.00    Pack years: 7.50    Quit date: 04/17/2005    Years since quitting: 13.3  . Smokeless tobacco: Never Used  Substance Use Topics  . Alcohol use: No    Current Outpatient Medications:  .  acetaminophen (TYLENOL) 325 MG tablet, Take 2 tablets (650 mg total) by mouth every 6 (six) hours as needed for mild pain or headache (fever >/= 101)., Disp:  , Rfl:  .  amLODipine-atorvastatin (CADUET) 5-10 MG tablet, Take 1 tablet by mouth daily., Disp: 90 tablet, Rfl: 0 .  aspirin EC 81 MG tablet, Take 1 tablet by mouth daily., Disp: , Rfl:  .  atorvastatin (LIPITOR) 20 MG tablet, Take 1 tablet (20 mg total) by mouth daily., Disp: 90 tablet, Rfl: 1 .  Blood Glucose Monitoring Suppl (ONETOUCH VERIO FLEX SYSTEM) w/Device KIT, 1 Units by Does not apply route daily. Dx:E11.9, Disp: 1 kit, Rfl: 0 .  fluticasone (FLONASE) 50 MCG/ACT nasal spray, Place 2 sprays into both nostrils daily as needed for allergies or rhinitis., Disp: 16 g, Rfl: 6 .  glucose blood test strip, Use as instructed daily Dx: E11.9, Disp: 100 each, Rfl: 3 .  metFORMIN (GLUCOPHAGE) 1000 MG tablet, TAKE 1 TABLET BY MOUTH TWICE DAILY WITH A MEAL (Patient taking  differently: Take 1,000 mg by mouth 2 (two) times daily with a meal. ), Disp: 60 tablet, Rfl: 0 .  multivitamin (ONE-A-DAY MEN'S) TABS tablet, Take 1 tablet by mouth daily., Disp: , Rfl:  .  ONETOUCH DELICA LANCETS 62E MISC, 1 Stick by Does not apply route daily. Dx:E11.9, Disp: 100 each, Rfl: 3 .  predniSONE (STERAPRED UNI-PAK 21 TAB) 10 MG (21) TBPK tablet, Please use  per package insrtuction, Disp: 21 tablet, Rfl: 0 .  sitaGLIPtin (JANUVIA) 100 MG tablet, Take 1 tablet (100 mg total) by mouth daily., Disp: 90 tablet, Rfl: 0 .  vitamin C (VITAMIN C) 500 MG tablet, Take 1 tablet (500 mg total) by mouth daily. Please take for 2 weeks, Disp:  , Rfl:  .  zinc sulfate 220 (50 Zn) MG capsule, Take 1 capsule (220 mg total) by mouth daily. Please take for 2 weeks, Disp:  , Rfl:   Allergies  Allergen Reactions  . Avelox [Moxifloxacin Hcl In Nacl] Hives and Itching  . Doxycycline Rash    Objective:   There were no vitals taken for this visit.  Patient is well-developed, well-nourished in no acute distress.  Resting comfortably sitting on edge of bed at home.  Head is normocephalic, atraumatic.  No labored breathing.  Speech is clear and coherent with logical contest.  Patient is alert and oriented at baseline.   Assessment and Plan:   1. COVID-19 virus infection 2. Pneumonia due to COVID-19 virus 3. Acute respiratory failure with hypoxia (H[facility]) - CBC w/Diff; Future - Comp Met (CMET); Future  Clinically resolved. Normalization of inflammatory markers at discharge. Will repeat CBC and CMP as recommendation of hospitalist to ensure levels remain stable/normalized. Lab appointment scheduled. He is to keep a close eye on glucose levels, minimizing cabs and keeping hydrated. If not continuing to trend down we will need to add on short term medication to his chronic regimen.    Leeanne Rio, [place] 08/26/2018

## 2018-08-29 ENCOUNTER — Ambulatory Visit (INDEPENDENT_AMBULATORY_CARE_PROVIDER_SITE_OTHER): Payer: Commercial Managed Care - PPO

## 2018-08-29 ENCOUNTER — Other Ambulatory Visit: Payer: Self-pay

## 2018-08-29 DIAGNOSIS — U071 COVID-19: Secondary | ICD-10-CM | POA: Diagnosis not present

## 2018-08-29 DIAGNOSIS — J1282 Pneumonia due to coronavirus disease 2019: Secondary | ICD-10-CM

## 2018-08-29 DIAGNOSIS — J1289 Other viral pneumonia: Secondary | ICD-10-CM

## 2018-08-29 DIAGNOSIS — J9601 Acute respiratory failure with hypoxia: Secondary | ICD-10-CM

## 2018-08-29 LAB — CBC WITH DIFFERENTIAL/PLATELET
Basophils Absolute: 0.1 10*3/uL (ref 0.0–0.1)
Basophils Relative: 0.9 % (ref 0.0–3.0)
Eosinophils Absolute: 0.1 10*3/uL (ref 0.0–0.7)
Eosinophils Relative: 0.5 % (ref 0.0–5.0)
HCT: 43.3 % (ref 39.0–52.0)
Hemoglobin: 14.2 g/dL (ref 13.0–17.0)
Lymphocytes Relative: 22.6 % (ref 12.0–46.0)
Lymphs Abs: 2.4 10*3/uL (ref 0.7–4.0)
MCHC: 32.7 g/dL (ref 30.0–36.0)
MCV: 80.7 fl (ref 78.0–100.0)
Monocytes Absolute: 1 10*3/uL (ref 0.1–1.0)
Monocytes Relative: 9.6 % (ref 3.0–12.0)
Neutro Abs: 6.9 10*3/uL (ref 1.4–7.7)
Neutrophils Relative %: 66.4 % (ref 43.0–77.0)
Platelets: 460 10*3/uL — ABNORMAL HIGH (ref 150.0–400.0)
RBC: 5.37 Mil/uL (ref 4.22–5.81)
RDW: 13.3 % (ref 11.5–15.5)
WBC: 10.4 10*3/uL (ref 4.0–10.5)

## 2018-08-29 LAB — COMPREHENSIVE METABOLIC PANEL
ALT: 26 U/L (ref 0–53)
AST: 13 U/L (ref 0–37)
Albumin: 4 g/dL (ref 3.5–5.2)
Alkaline Phosphatase: 77 U/L (ref 39–117)
BUN: 17 mg/dL (ref 6–23)
CO2: 30 mEq/L (ref 19–32)
Calcium: 9.4 mg/dL (ref 8.4–10.5)
Chloride: 94 mEq/L — ABNORMAL LOW (ref 96–112)
Creatinine, Ser: 1.12 mg/dL (ref 0.40–1.50)
GFR: 83.02 mL/min (ref >60.00)
Glucose, Bld: 345 mg/dL — ABNORMAL HIGH (ref 70–99)
Potassium: 5.2 mEq/L — ABNORMAL HIGH (ref 3.5–5.1)
Sodium: 133 mEq/L — ABNORMAL LOW (ref 135–145)
Total Bilirubin: 0.6 mg/dL (ref 0.2–1.2)
Total Protein: 6.8 g/dL (ref 6.0–8.3)

## 2018-09-08 ENCOUNTER — Telehealth: Payer: Self-pay | Admitting: Physician Assistant

## 2018-09-08 ENCOUNTER — Ambulatory Visit: Payer: Self-pay | Admitting: *Deleted

## 2018-09-08 NOTE — Telephone Encounter (Signed)
Pt had called earlier today and has bp of 86/69. Pls call back NT backed up FU at [place]

## 2018-09-08 NOTE — Telephone Encounter (Signed)
Edwin [name] M 1 hour ago (5:11 PM)     Pt had called earlier today and has bp of 86/69. Pls call back NT backed up FU at [phone]      Documentation      See telephone encounter from 09/08/18. Pt called and stated that his BP was 86/69 on Saturday morning after taking his BP medications. Pt reports that his BP today at 3 pm was 119/79. Pt denies feeling dizzy, lightheaded or weak at this time. Pt advised to continue to monitor BP readings.Pt advised to seek treatment in the ED if BP <90 and he has lightheadedness, weakness or dizziness. Pt verbalized understanding.  Reason for Disposition . [7] Fall in systolic BP > 20 mm Hg from normal AND [2] NOT dizzy, lightheaded, or weak  Answer Assessment - Initial Assessment Questions 1. BLOOD PRESSURE: "What is the blood pressure?" "Did you take at least two measurements 5 minutes apart?"     Most recent BP was 119/79 2. ONSET: "When did you take your blood pressure?"     3 pm on Saturday 3. HOW: "How did you obtain the blood pressure?" (e.g., visiting nurse, automatic home BP monitor)     Home BP monitor 4. HISTORY: "Do you have a history of low blood pressure?" "What is your blood pressure normally?"     No 5. MEDICATIONS: "Are you taking any medications for blood pressure?" If yes: "Have they been changed recently?"     Yes has been taking BP medications 6. PULSE RATE: "Do you know what your pulse rate is?"      Thinks it was around 101 on Saturday 7. OTHER SYMPTOMS: "Have you been sick recently?" "Have you had a recent injury?"     Not assessed 8. PREGNANCY: "Is there any chance you are pregnant?" "When was your last menstrual period?"     n/a  Protocols used: LOW BLOOD PRESSURE-A-AH

## 2018-09-08 NOTE — Telephone Encounter (Signed)
Please assess BP if they meant BP ranges from 118/76 to 86/69 or if they meant 186/69.

## 2018-09-08 NOTE — Telephone Encounter (Signed)
Pt has called this morning and is concerned about b p and is still waiting for call back call bp is 86 over 69 triaging at 5:05 pm 7/6

## 2018-09-08 NOTE — Telephone Encounter (Signed)
Pt called in with BP and blood sugar readings.  Blood sugar has been anywhere from 230-300   Blood sugar this morning was 272  BP has been running anywhere from 118/76 to 86/69. He states that he has been taking his BP sitting and no active before taking his bp.   BP this morning was 119/72.   Pt want to make cody aware of this.

## 2018-09-09 NOTE — Telephone Encounter (Signed)
Patient wanted to schedule on Monday since patient has lab appointment and can discuss Diabetes

## 2018-09-09 NOTE — Telephone Encounter (Signed)
Spoke with patient about low blood pressure. He states when he had the low blood pressure was minimal symptoms.  Denies any recent low blood pressures.  Patient is concerned about his blood sugars.  Still running 230-290 in AM Fasting. His dad uses Ozempic for his diabetes. 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 His dad gave him a glucose support supplement to help with lowering blood sugars. He has not started until he spoke to his PCP.  Patient also states he is still having sinus issues. He is using the Flonase daily. He was advised not to use Ibuprofen.  He has a nasal decongestant quality plus. He has stuffy nose and runny nose. Occurs mostly when cutting grass/with mask on, and dust.   Please advise

## 2018-09-09 NOTE — Telephone Encounter (Signed)
Please have patient schedule a VV with me to discuss levels and sinus symptoms so we can alter his regimen.

## 2018-09-15 ENCOUNTER — Encounter: Payer: Self-pay | Admitting: Physician Assistant

## 2018-09-15 ENCOUNTER — Ambulatory Visit: Payer: Commercial Managed Care - PPO

## 2018-09-15 ENCOUNTER — Other Ambulatory Visit: Payer: Self-pay

## 2018-09-15 ENCOUNTER — Other Ambulatory Visit: Payer: Self-pay | Admitting: Physician Assistant

## 2018-09-15 ENCOUNTER — Ambulatory Visit (INDEPENDENT_AMBULATORY_CARE_PROVIDER_SITE_OTHER): Payer: Commercial Managed Care - PPO | Admitting: Physician Assistant

## 2018-09-15 VITALS — BP 114/70 | HR 81 | Temp 99.1°F | Resp 16 | Ht 71.0 in | Wt 286.0 lb

## 2018-09-15 DIAGNOSIS — E1165 Type 2 diabetes mellitus with hyperglycemia: Secondary | ICD-10-CM

## 2018-09-15 DIAGNOSIS — E119 Type 2 diabetes mellitus without complications: Secondary | ICD-10-CM

## 2018-09-15 MED ORDER — OZEMPIC (0.25 OR 0.5 MG/DOSE) 2 MG/1.5ML ~~LOC~~ SOPN: 0.2500 mg | PEN_INJECTOR | SUBCUTANEOUS | 3 refills | Status: AC

## 2018-09-15 NOTE — Progress Notes (Signed)
History of Present Illness: Patient is a 53 y.o. male who presents to clinic today for follow-up of Diabetes Mellitus II, uncontrolled.  Patient currently on medication regimen of Metformin 1000 mg BID and Januvia 100 mg daily.  Endorses taking medications as directed. Endorses increase in exercise -- walking daily now. Has cut back on starches and carbohydrates.  Denies polyuria, polydipsia, polyphagia, vision changes, or numbness/tingling of hands or feet. Endorses checking blood glucose as directed. Notes glucose averaging 180-250. Father is on Ozempic and he would like to discuss the potential of taking this.    Latest Maintenance: A1C --  Lab Results  Component Value Date   HGBA1C 9.0 (H) 08/04/2018   Diabetic Eye Exam -- UTD Urine Microalbumin -- UTD Foot Exam -- UTD. No new concerns.  Past Medical History:  Diagnosis Date   Allergy    DM (diabetes mellitus) ([place])    type 2   High cholesterol    Hypertension    Low testosterone     Current Outpatient Medications on File Prior to Visit  Medication Sig Dispense Refill   acetaminophen (TYLENOL) 325 MG tablet Take 2 tablets (650 mg total) by mouth every 6 (six) hours as needed for mild pain or headache (fever >/= 101).     amLODipine-atorvastatin (CADUET) 5-10 MG tablet Take 1 tablet by mouth daily. 90 tablet 0   aspirin EC 81 MG tablet Take 1 tablet by mouth daily.     atorvastatin (LIPITOR) 20 MG tablet Take 1 tablet (20 mg total) by mouth daily. 90 tablet 1   Blood Glucose Monitoring Suppl ([place]) w/Device KIT 1 Units by Does not apply route daily. Dx:E11.9 1 kit 0   fluticasone (FLONASE) 50 MCG/ACT nasal spray Place 2 sprays into both nostrils daily as needed for allergies or rhinitis. 16 g 6   glucose blood test strip Use as instructed daily Dx: E11.9 100 each 3   metFORMIN (GLUCOPHAGE) 1000 MG tablet TAKE 1 TABLET BY MOUTH TWICE DAILY WITH A MEAL (Patient taking differently: Take 1,000 mg  by mouth 2 (two) times daily with a meal. ) 60 tablet 0   multivitamin (ONE-A-DAY MEN'S) TABS tablet Take 1 tablet by mouth daily.     ONETOUCH DELICA LANCETS 40C MISC 1 Stick by Does not apply route daily. Dx:E11.9 100 each 3   predniSONE (STERAPRED UNI-PAK 21 TAB) 10 MG (21) TBPK tablet Please use per package insrtuction 21 tablet 0   sitaGLIPtin (JANUVIA) 100 MG tablet Take 1 tablet (100 mg total) by mouth daily. 90 tablet 0   vitamin C (VITAMIN C) 500 MG tablet Take 1 tablet (500 mg total) by mouth daily. Please take for 2 weeks     zinc sulfate 220 (50 Zn) MG capsule Take 1 capsule (220 mg total) by mouth daily. Please take for 2 weeks     No current facility-administered medications on file prior to visit.     Allergies  Allergen Reactions   Avelox [Moxifloxacin Hcl In Nacl] Hives and Itching   Doxycycline Rash    Family History  Problem Relation Age of Onset   Aneurysm Mother 37       Deceased   Healthy Father        Living   Prostate cancer Paternal Uncle    Healthy Daughter        x3   Colon cancer Neg Hx    Esophageal cancer Neg Hx    Rectal cancer Neg Hx  Stomach cancer Neg Hx     Social History   Socioeconomic History   Marital status: Single    Spouse name: Not on file   Number of children: Not on file   Years of education: Not on file   Highest education level: Not on file  Occupational History   Not on file  Social Needs   Financial resource strain: Not on file   Food insecurity    Worry: Not on file    Inability: Not on file   Transportation needs    Medical: Not on file    Non-medical: Not on file  Tobacco Use   Smoking status: Former Smoker    Packs/day: 0.50    Years: 15.00    Pack years: 7.50    Quit date: 04/17/2005    Years since quitting: 13.4   Smokeless tobacco: Never Used  Substance and Sexual Activity   Alcohol use: No   Drug use: No   Sexual activity: Yes  Lifestyle   Physical activity    Days  per week: Not on file    Minutes per session: Not on file   Stress: Not on file  Relationships   Social connections    Talks on phone: Not on file    Gets together: Not on file    Attends religious service: Not on file    Active member of club or organization: Not on file    Attends meetings of clubs or organizations: Not on file    Relationship status: Not on file  Other Topics Concern   Not on file  Social History Narrative   Not on file   Review of Systems: Pertinent ROS are listed in HPI  Physical Examination: BP 114/70    Pulse 81    Temp 99.1 F (37.3 C) (Skin)    Resp 16    Ht _0  (1.803 m)    Wt 286 lb (129.7 kg)    SpO2 99%    BMI 39.89 kg/m  General appearance: alert, cooperative, appears stated age and no distress Head: Normocephalic, without obvious abnormality, atraumatic Ears: normal TM's and external ear canals both ears and mild serous fluid behind TM bilaterally Nose: Nares normal. Septum midline. Mucosa normal. No drainage or sinus tenderness. Throat: lips, mucosa, and tongue normal; teeth and gums normal Lungs: clear to auscultation bilaterally Heart: regular rate and rhythm, S1, S2 normal, no murmur, click, rub or gallop Pulses: 2+ and symmetric Skin: Skin color, texture, turgor normal. No rashes or lesions Neurologic: Grossly normal  Assessment/Plan: 1. Uncontrolled type 2 diabetes mellitus with hyperglycemia (HCC) Last A1C at 9. Too soon to recheck. Had been given steroids by specialist with climb in glucose which he has had a hard time recovering from. Taking medications as directed. Increased exercise with some improvement in glucose levels. Agree reasonable to try Ozempic. Is on Januvia (DPP4) so will stop this when starting Ozempic. Wills tart 0.25 mg once weekly x 2 weeks, then increase to 0.5 mg once weekly. Follow-up 1 month for reassessment.  - Basic metabolic panel

## 2018-09-15 NOTE — Patient Instructions (Signed)
Please continue your Metformin as directed. Stop the Januvia.  Start the [place] as directed. Please bring in the medication so that we can perform first injection here. Have the front office staff schedule a nurse visit for this.  Keep close check on glucose levels and record. Please continue with dietary changes and increased exercise.   Follow-up with me in 1 month.   Diabetes Mellitus and Exercise Exercising regularly is important for your overall health, especially when you have diabetes (diabetes mellitus). Exercising is not only about losing weight. It has many other health benefits, such as increasing muscle strength and bone density and reducing body fat and stress. This leads to improved fitness, flexibility, and endurance, all of which result in better overall health. Exercise has additional benefits for people with diabetes, including:  Reducing appetite.  Helping to lower and control blood glucose.  Lowering blood pressure.  Helping to control amounts of fatty substances (lipids) in the blood, such as cholesterol and triglycerides.  Helping the body to respond better to insulin (improving insulin sensitivity).  Reducing how much insulin the body needs.  Decreasing the risk for heart disease by: ? Lowering cholesterol and triglyceride levels. ? Increasing the levels of good cholesterol. ? Lowering blood glucose levels. What is my activity plan? Your health care provider or certified diabetes educator can help you make a plan for the type and frequency of exercise (activity plan) that works for you. Make sure that you:  Do at least 150 minutes of moderate-intensity or vigorous-intensity exercise each week. This could be brisk walking, biking, or water aerobics. ? Do stretching and strength exercises, such as yoga or weightlifting, at least 2 times a week. ? Spread out your activity over at least 3 days of the week.  Get some form of physical activity every day. ? Do  not go more than 2 days in a row without some kind of physical activity. ? Avoid being inactive for more than 30 minutes at a time. Take frequent breaks to walk or stretch.  Choose a type of exercise or activity that you enjoy, and set realistic goals.  Start slowly, and gradually increase the intensity of your exercise over time. What do I need to know about managing my diabetes?   Check your blood glucose before and after exercising. ? If your blood glucose is 240 mg/dL (13.3 mmol/L) or higher before you exercise, check your urine for ketones. If you have ketones in your urine, do not exercise until your blood glucose returns to normal. ? If your blood glucose is 100 mg/dL (5.6 mmol/L) or lower, eat a snack containing 15-20 grams of carbohydrate. Check your blood glucose 15 minutes after the snack to make sure that your level is above 100 mg/dL (5.6 mmol/L) before you start your exercise.  Know the symptoms of low blood glucose (hypoglycemia) and how to treat it. Your risk for hypoglycemia increases during and after exercise. Common symptoms of hypoglycemia can include: ? Hunger. ? Anxiety. ? Sweating and feeling clammy. ? Confusion. ? Dizziness or feeling light-headed. ? Increased heart rate or palpitations. ? Blurry vision. ? Tingling or numbness around the mouth, lips, or tongue. ? Tremors or shakes. ? Irritability.  Keep a rapid-acting carbohydrate snack available before, during, and after exercise to help prevent or treat hypoglycemia.  Avoid injecting insulin into areas of the body that are going to be exercised. For example, avoid injecting insulin into: ? The arms, when playing tennis. ? The legs, when jogging.  Keep records of your exercise habits. Doing this can help you and your health care provider adjust your diabetes management plan as needed. Write down: ? Food that you eat before and after you exercise. ? Blood glucose levels before and after you exercise. ? The  type and amount of exercise you have done. ? When your insulin is expected to peak, if you use insulin. Avoid exercising at times when your insulin is peaking.  When you start a new exercise or activity, work with your health care provider to make sure the activity is safe for you, and to adjust your insulin, medicines, or food intake as needed.  Drink plenty of water while you exercise to prevent dehydration or heat stroke. Drink enough fluid to keep your urine clear or pale yellow. Summary  Exercising regularly is important for your overall health, especially when you have diabetes (diabetes mellitus).  Exercising has many health benefits, such as increasing muscle strength and bone density and reducing body fat and stress.  Your health care provider or certified diabetes educator can help you make a plan for the type and frequency of exercise (activity plan) that works for you.  When you start a new exercise or activity, work with your health care provider to make sure the activity is safe for you, and to adjust your insulin, medicines, or food intake as needed. This information is not intended to replace advice given to you by your health care provider. Make sure you discuss any questions you have with your health care provider. Document Released: 05/12/2003 Document Revised: 09/13/2016 Document Reviewed: 08/01/2015 Elsevier Patient Education  2020 [employer].

## 2018-09-16 LAB — BASIC METABOLIC PANEL
BUN: 14 mg/dL (ref 6–23)
CO2: 27 mEq/L (ref 19–32)
Calcium: 9.4 mg/dL (ref 8.4–10.5)
Chloride: 101 mEq/L (ref 96–112)
Creatinine, Ser: 0.93 mg/dL (ref 0.40–1.50)
GFR: 102.87 mL/min (ref >60.00)
Glucose, Bld: 242 mg/dL — ABNORMAL HIGH (ref 70–99)
Potassium: 4.8 mEq/L (ref 3.5–5.1)
Sodium: 136 mEq/L (ref 135–145)

## 2018-09-19 ENCOUNTER — Other Ambulatory Visit: Payer: Self-pay

## 2018-09-19 ENCOUNTER — Ambulatory Visit (INDEPENDENT_AMBULATORY_CARE_PROVIDER_SITE_OTHER): Payer: Commercial Managed Care - PPO

## 2018-09-19 ENCOUNTER — Other Ambulatory Visit: Payer: Self-pay | Admitting: *Deleted

## 2018-09-19 ENCOUNTER — Telehealth: Payer: Self-pay | Admitting: *Deleted

## 2018-09-19 DIAGNOSIS — Z7189 Other specified counseling: Secondary | ICD-10-CM

## 2018-09-19 DIAGNOSIS — E119 Type 2 diabetes mellitus without complications: Secondary | ICD-10-CM

## 2018-09-19 MED ORDER — GLUCOSE BLOOD VI STRP: ORAL_STRIP | 3 refills | Status: AC

## 2018-09-19 NOTE — Progress Notes (Signed)
Patient seen today for an educational nurse visit on how to administer Ozempic 0.25 mg per PCP, Elyn Aquas, PA, verbal order. Patient shown how to attach needle to pen, turning knob to accurate dosage, and administering medication. I administered first dose into stomach SubCut. Patient was informed to rotate administration site.

## 2018-09-19 NOTE — Telephone Encounter (Signed)
Patient calling wanting to know if he can drink [place] Tea. Advised that I would send question to PCP and let him know.

## 2018-09-20 NOTE — Progress Notes (Signed)
CMA note reviewed.   Reed Dady Cody Ferris Fielden, PA-C  

## 2018-09-22 NOTE — Telephone Encounter (Signed)
There is no reason he cannot try it however with the different herbal ingredients he should look out for any diarrhea or abdominal cramping as sometimes our intestines are sensitive to these things. Do not drink more than as directed. Also with caffeine content if he notes any racing heart or change in sleep, stop the tea.

## 2018-09-22 NOTE — Telephone Encounter (Signed)
Advised patient of PCP recommendations.

## 2018-09-29 ENCOUNTER — Other Ambulatory Visit: Payer: Self-pay

## 2018-09-29 ENCOUNTER — Ambulatory Visit (INDEPENDENT_AMBULATORY_CARE_PROVIDER_SITE_OTHER): Payer: Commercial Managed Care - PPO

## 2018-09-29 DIAGNOSIS — D473 Essential (hemorrhagic) thrombocythemia: Secondary | ICD-10-CM | POA: Diagnosis not present

## 2018-09-29 DIAGNOSIS — D75839 Thrombocytosis, unspecified: Secondary | ICD-10-CM

## 2018-09-29 LAB — CBC WITH DIFFERENTIAL/PLATELET
Basophils Absolute: 0 10*3/uL (ref 0.0–0.1)
Basophils Relative: 0.3 % (ref 0.0–3.0)
Eosinophils Absolute: 0.1 10*3/uL (ref 0.0–0.7)
Eosinophils Relative: 1 % (ref 0.0–5.0)
HCT: 41.9 % (ref 39.0–52.0)
Hemoglobin: 13.7 g/dL (ref 13.0–17.0)
Lymphocytes Relative: 30.8 % (ref 12.0–46.0)
Lymphs Abs: 2.2 10*3/uL (ref 0.7–4.0)
MCHC: 32.6 g/dL (ref 30.0–36.0)
MCV: 81.9 fl (ref 78.0–100.0)
Monocytes Absolute: 0.7 10*3/uL (ref 0.1–1.0)
Monocytes Relative: 9.6 % (ref 3.0–12.0)
Neutro Abs: 4.1 10*3/uL (ref 1.4–7.7)
Neutrophils Relative %: 58.3 % (ref 43.0–77.0)
Platelets: 369 10*3/uL (ref 150.0–400.0)
RBC: 5.11 Mil/uL (ref 4.22–5.81)
RDW: 14.3 % (ref 11.5–15.5)
WBC: 7.1 10*3/uL (ref 4.0–10.5)

## 2018-10-13 ENCOUNTER — Other Ambulatory Visit: Payer: Self-pay | Admitting: *Deleted

## 2018-10-13 DIAGNOSIS — E1165 Type 2 diabetes mellitus with hyperglycemia: Secondary | ICD-10-CM

## 2018-10-13 MED ORDER — OZEMPIC (0.25 OR 0.5 MG/DOSE) 2 MG/1.5ML ~~LOC~~ SOPN: 0.5000 mg | PEN_INJECTOR | SUBCUTANEOUS | 1 refills | Status: DC

## 2018-11-03 ENCOUNTER — Telehealth: Payer: Self-pay | Admitting: Physician Assistant

## 2018-11-03 ENCOUNTER — Ambulatory Visit: Payer: Self-pay | Admitting: *Deleted

## 2018-11-03 NOTE — Telephone Encounter (Signed)
Called [employer] patient assistance to reorder patient medication [name]. Medication reordered. Should ship between 5-7 business days. Order # [id number]  Patient application is due for renewal after 03/05/19. He can renew earlier

## 2018-11-03 NOTE — Telephone Encounter (Signed)
I called pt back because his name was put into the Triage Queue for a return call to [place].    There is nothing in the chart indicating who this is.     I called [facility] where Elyn Aquas, PA-C is and they don't have anyone there by the name of Edwin [name].  They did not know anything about the call.  I let Edwin [name] know there was nothing in his chart indicating why or who called.    I apologized for not being able to help him.  He is going to call [place]hurst Martin's office back now and see what is going on.    He thinks it's in reference to a special medication they have to order for him but he is not sure.

## 2018-11-03 NOTE — Telephone Encounter (Signed)
Medication Refill - Medication:   amLODipine-atorvastatin (CADUET) 5-10 MG tablet   Pt also states he is needing something stronger for back/neck pain.  Please advise.   Preferred Pharmacy:  [facility], [place]. [phone] (Phone) [phone] (Fax)

## 2018-11-04 ENCOUNTER — Telehealth: Payer: Self-pay | Admitting: Physician Assistant

## 2018-11-04 NOTE — Telephone Encounter (Signed)
As noted in other phone note - needs appointment to discuss. Patient being called for appt.

## 2018-11-04 NOTE — Telephone Encounter (Signed)
LM asking pt to call back to schedule an appt with cody.

## 2018-11-04 NOTE — Telephone Encounter (Signed)
Pt scheduled  

## 2018-11-04 NOTE — Telephone Encounter (Signed)
Needs appt to discuss back pain (see other phone note) and to discuss forms.

## 2018-11-04 NOTE — Telephone Encounter (Signed)
Pt dropped off FMLA forms to be completed. 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 The forms have been placed in the bin up front with a charge sheet.

## 2018-11-04 NOTE — Telephone Encounter (Signed)
Pt came in today asking if something could be called in for his back and neck pain. He states that he doesn't want narcotic meds because he wont be able to drive. Pt can be reached at the home #

## 2018-11-07 ENCOUNTER — Encounter: Payer: Self-pay | Admitting: Physician Assistant

## 2018-11-07 ENCOUNTER — Ambulatory Visit (INDEPENDENT_AMBULATORY_CARE_PROVIDER_SITE_OTHER): Payer: Commercial Managed Care - PPO | Admitting: Physician Assistant

## 2018-11-07 ENCOUNTER — Other Ambulatory Visit: Payer: Self-pay

## 2018-11-07 ENCOUNTER — Ambulatory Visit: Payer: Commercial Managed Care - PPO | Admitting: Physician Assistant

## 2018-11-07 DIAGNOSIS — G8929 Other chronic pain: Secondary | ICD-10-CM

## 2018-11-07 DIAGNOSIS — M545 Low back pain: Secondary | ICD-10-CM

## 2018-11-07 DIAGNOSIS — E119 Type 2 diabetes mellitus without complications: Secondary | ICD-10-CM

## 2018-11-07 DIAGNOSIS — M542 Cervicalgia: Secondary | ICD-10-CM

## 2018-11-07 NOTE — Progress Notes (Signed)
I have discussed the procedure for the virtual visit with the patient who has given consent to proceed with assessment and treatment.   Jaquan Sadowsky S Gwendalyn Mcgonagle, CMA     

## 2018-11-07 NOTE — Progress Notes (Signed)
 Virtual Visit via Video   I connected with patient on 11/07/18 at  9:30 AM EDT by a video enabled telemedicine application and verified that I am speaking with the correct person using two identifiers.  Location patient: Home Location provider: [facility], Office Persons participating in the virtual visit: Patient, Provider, CMA (Patina Moore)  I discussed the limitations of evaluation and management by telemedicine and the availability of in person appointments. The patient expressed understanding and agreed to proceed.  Subjective:   HPI:   Patient presents via Doxy.Me today to discuss separate issues.   Patient with DM II, taking medications as directed. 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 Overall his glucose is staying in range but his supervisor recommended intermittent FMLA in case this occurs he will not have an absence or tardiness counted against him.   Patient also notes some intermittent neck pain and lower back pain. Mainly noted in the morning with some short-lived stiffness and aching. Notes he recently got a new mattress and noting significant improvement. Only having the pain 1-2 x week. Notes the pain is mild and improves over the course day. Stretches to help. Avoiding oral NSAIDs. Biofreeze does help. In regards to the neck pain notes some tension in neck and shoulders -- sometimes causing a headache. Maybe once per week and related to stress levels.   ROS:   See pertinent positives and negatives per HPI.  Patient Active Problem List   Diagnosis Date Noted  . Acute respiratory failure with hypoxia (HCC) 08/17/2018  . Seborrheic keratosis 04/15/2017  . PVC's (premature ventricular contractions) 09/03/2014  . Essential hypertension, benign 11/03/2013  . Diabetes mellitus type II, controlled (HCC) 08/03/2013  . Hyperlipidemia 08/03/2013  . Low serum testosterone level  08/03/2013  . Prostate cancer screening 08/03/2013  . Visit for preventive health examination 08/03/2013    Social History   Tobacco Use  . Smoking status: Former Smoker    Packs/day: 0.50    Years: 15.00    Pack years: 7.50    Quit date: 04/17/2005    Years since quitting: 13.5  . Smokeless tobacco: Never Used  Substance Use Topics  . Alcohol use: No    Current Outpatient Medications:  .  acetaminophen (TYLENOL) 325 MG tablet, Take 2 tablets (650 mg total) by mouth every 6 (six) hours as needed for mild pain or headache (fever >/= 101)., Disp:  , Rfl:  .  amLODipine-atorvastatin (CADUET) 5-10 MG tablet, Take 1 tablet by mouth daily., Disp: 90 tablet, Rfl: 0 .  aspirin EC 81 MG tablet, Take 1 tablet by mouth daily., Disp: , Rfl:  .  atorvastatin (LIPITOR) 20 MG tablet, Take 1 tablet (20 mg total) by mouth daily., Disp: 90 tablet, Rfl: 1 .  Blood Glucose Monitoring Suppl (ONETOUCH VERIO FLEX SYSTEM) w/Device KIT, 1 Units by Does not apply route daily. Dx:E11.9, Disp: 1 kit, Rfl: 0 .  fluticasone (FLONASE) 50 MCG/ACT nasal spray, Place 2 sprays into both nostrils daily as needed for allergies or rhinitis., Disp: 16 g, Rfl: 6 .  glucose blood test strip, Use as instructed daily Dx: E11.9, Disp: 100 each, Rfl: 3 .  metFORMIN (GLUCOPHAGE) 1000 MG tablet, TAKE 1 TABLET BY MOUTH TWICE DAILY WITH A MEAL, Disp: 180 tablet, Rfl: 1 .  multivitamin (ONE-A-DAY MEN'S) TABS tablet, Take 1 tablet by mouth daily., Disp: , Rfl:  .    ONETOUCH DELICA LANCETS 33G MISC, 1 Stick by Does not apply route daily. Dx:E11.9, Disp: 100 each, Rfl: 3 .  Semaglutide,0.25 or 0.5MG/DOS, (OZEMPIC, 0.25 OR 0.5 MG/DOSE,) 2 MG/1.5ML SOPN, Inject 0.5 mg into the skin once a week., Disp: 4 pen, Rfl: 1 .  sildenafil (VIAGRA) 50 MG tablet, TAKE 2 TABLETS BY MOUTH DAILY AS NEEDED FOR ERECTILE DYSFUNCTION., Disp: 4 tablet, Rfl: 11 .  vitamin C (VITAMIN C) 500 MG tablet, Take 1 tablet (500 mg total) by mouth daily. Please take for 2  weeks, Disp:  , Rfl:  .  zinc sulfate 220 (50 Zn) MG capsule, Take 1 capsule (220 mg total) by mouth daily. Please take for 2 weeks, Disp:  , Rfl:   Allergies  Allergen Reactions  . Avelox [Moxifloxacin Hcl In Nacl] Hives and Itching  . Doxycycline Rash    Objective:   There were no vitals taken for this visit.  Patient is well-developed, well-nourished in no acute distress.  Resting comfortably at home.  Head is normocephalic, atraumatic.  No labored breathing.  Speech is clear and coherent with logical content.  Patient is alert and oriented at baseline.   Assessment and Plan:   1. Controlled type 2 diabetes mellitus without complication, without long-term current use of insulin (HCC) Continue current regimen. Routine follow-up scheduled. Intermittent FMLA written.  2. Chronic bilateral low back pain without sciatica 3. Cervical pain (neck) Seems related to OA giving descriptors. Improving significantly with change in mattress and stretching. Supportive measures and stretching reviewed. Will monitor. Rx Tizanidine sent in to help with cervical tension if having a flare. If not continuing to improve, would consider imaging due to chronicity.    William Cody Martin, PA-C 11/07/2018   

## 2018-11-10 MED ORDER — TIZANIDINE HCL 4 MG PO TABS: 4.0000 mg | ORAL_TABLET | Freq: Three times a day (TID) | ORAL | 0 refills | Status: DC | PRN

## 2018-11-11 NOTE — Telephone Encounter (Signed)
Paperwork completed and given to CMA for fax.

## 2018-11-11 NOTE — Telephone Encounter (Signed)
Pt called to see if his FMLA paperwork was done, I told him I didn't see anything yet but that we would call him when its done

## 2018-11-12 NOTE — Telephone Encounter (Signed)
Advised patient that FMLA paperwork is ready for pick up and samples of [name] just came in today and ready for pick up as well. He is agreeable and will pick up everything tomorrow

## 2018-11-27 ENCOUNTER — Telehealth: Payer: Self-pay | Admitting: Physician Assistant

## 2018-11-27 NOTE — Telephone Encounter (Signed)
Pt would like the letter for foot pain he gets while driving and being that he is a diabetic.

## 2018-11-27 NOTE — Telephone Encounter (Signed)
Patient called in asking for update. Advised that all would be discussed at tomorrow's visit.

## 2018-11-27 NOTE — Telephone Encounter (Signed)
I see he is scheduled for tomorrow. Will discuss at that time.

## 2018-11-27 NOTE — Telephone Encounter (Signed)
I have reviewed chart and all of the letters written in the past 2.5 years as this was not familiar to me. For what reason is he requesting this -- related to back issue or something else? If relating to back issue at this point would need assessment with a specialist to determine true limitations.

## 2018-11-27 NOTE — Telephone Encounter (Signed)
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 He states that he and [name] have talked about this before and that the last letter was only good for one yr. Pt can be reached at [phone]

## 2018-11-28 ENCOUNTER — Other Ambulatory Visit: Payer: Self-pay

## 2018-11-28 ENCOUNTER — Encounter: Payer: Self-pay | Admitting: Physician Assistant

## 2018-11-28 ENCOUNTER — Ambulatory Visit (INDEPENDENT_AMBULATORY_CARE_PROVIDER_SITE_OTHER): Payer: Commercial Managed Care - PPO | Admitting: Physician Assistant

## 2018-11-28 VITALS — BP 122/82 | HR 74 | Temp 98.6°F | Resp 16 | Ht 71.0 in | Wt 291.0 lb

## 2018-11-28 DIAGNOSIS — E119 Type 2 diabetes mellitus without complications: Secondary | ICD-10-CM | POA: Diagnosis not present

## 2018-11-28 NOTE — Progress Notes (Signed)
Patient presents to clinic today to discuss work accomodation. Also due for repeat A1C and BMP as was too soon to check at last visit. Is taking his medications as directed overall. Has questions about making sure to get correct Ozempic dose -- stating he cannot find the 0.5 mg dose on his pen.  Past Medical History:  Diagnosis Date  . Allergy   . DM (diabetes mellitus) ([place])    type 2  . High cholesterol   . Hypertension   . Low testosterone     Current Outpatient Medications on File Prior to Visit  Medication Sig Dispense Refill  . acetaminophen (TYLENOL) 325 MG tablet Take 2 tablets (650 mg total) by mouth every 6 (six) hours as needed for mild pain or headache (fever >/= 101).    Marland Kitchen amLODipine-atorvastatin (CADUET) 5-10 MG tablet Take 1 tablet by mouth daily. 90 tablet 0  . aspirin EC 81 MG tablet Take 1 tablet by mouth daily.    Marland Kitchen atorvastatin (LIPITOR) 20 MG tablet Take 1 tablet (20 mg total) by mouth daily. 90 tablet 1  . Blood Glucose Monitoring Suppl (ONETOUCH VERIO FLEX SYSTEM) w/Device KIT 1 Units by Does not apply route daily. Dx:E11.9 1 kit 0  . fluticasone (FLONASE) 50 MCG/ACT nasal spray Place 2 sprays into both nostrils daily as needed for allergies or rhinitis. 16 g 6  . glucose blood test strip Use as instructed daily Dx: E11.9 100 each 3  . metFORMIN (GLUCOPHAGE) 1000 MG tablet TAKE 1 TABLET BY MOUTH TWICE DAILY WITH A MEAL 180 tablet 1  . multivitamin (ONE-A-DAY MEN'S) TABS tablet Take 1 tablet by mouth daily.    Glory Rosebush DELICA LANCETS 66M MISC 1 Stick by Does not apply route daily. Dx:E11.9 100 each 3  . Semaglutide,0.25 or 0.5MG/DOS, (OZEMPIC, 0.25 OR 0.5 MG/DOSE,) 2 MG/1.5ML SOPN Inject 0.5 mg into the skin once a week. 4 pen 1  . sildenafil (VIAGRA) 50 MG tablet TAKE 2 TABLETS BY MOUTH DAILY AS NEEDED FOR ERECTILE DYSFUNCTION. 4 tablet 11  . tiZANidine (ZANAFLEX) 4 MG tablet Take 1 tablet (4 mg total) by mouth every 8 (eight) hours as needed for muscle spasms.  15 tablet 0  . vitamin C (VITAMIN C) 500 MG tablet Take 1 tablet (500 mg total) by mouth daily. Please take for 2 weeks    . zinc sulfate 220 (50 Zn) MG capsule Take 1 capsule (220 mg total) by mouth daily. Please take for 2 weeks     No current facility-administered medications on file prior to visit.     Allergies  Allergen Reactions  . Avelox [Moxifloxacin Hcl In Nacl] Hives and Itching  . Doxycycline Rash    Family History  Problem Relation Age of Onset  . Aneurysm Mother 88       Deceased  . Healthy Father        Living  . Prostate cancer Paternal Uncle   . Healthy Daughter        x3  . Colon cancer Neg Hx   . Esophageal cancer Neg Hx   . Rectal cancer Neg Hx   . Stomach cancer Neg Hx     Social History   Socioeconomic History  . Marital status: Single    Spouse name: Not on file  . Number of children: Not on file  . Years of education: Not on file  . Highest education level: Not on file  Occupational History  . Not on file  Social  Needs  . Financial resource strain: Not on file  . Food insecurity    Worry: Not on file    Inability: Not on file  . Transportation needs    Medical: Not on file    Non-medical: Not on file  Tobacco Use  . Smoking status: Former Smoker    Packs/day: 0.50    Years: 15.00    Pack years: 7.50    Quit date: 04/17/2005    Years since quitting: 13.6  . Smokeless tobacco: Never Used  Substance and Sexual Activity  . Alcohol use: No  . Drug use: No  . Sexual activity: Yes  Lifestyle  . Physical activity    Days per week: Not on file    Minutes per session: Not on file  . Stress: Not on file  Relationships  . Social Herbalist on phone: Not on file    Gets together: Not on file    Attends religious service: Not on file    Active member of club or organization: Not on file    Attends meetings of clubs or organizations: Not on file    Relationship status: Not on file  Other Topics Concern  . Not on file  Social  History Narrative  . Not on file   Review of Systems - See HPI.  All other ROS are negative.  Wt 291 lb (132 kg)   BMI 40.59 kg/m   Physical Exam Vitals signs and nursing note reviewed.  Constitutional:      Appearance: Normal appearance.  HENT:     Head: Normocephalic and atraumatic.  Neck:     Musculoskeletal: Neck supple.  Cardiovascular:     Rate and Rhythm: Normal rate and regular rhythm.     Heart sounds: Normal heart sounds.  Pulmonary:     Effort: Pulmonary effort is normal.  Neurological:     General: No focal deficit present.     Mental Status: He is alert and oriented to person, place, and time.  Psychiatric:        Mood and Affect: Mood normal.    Recent Results (from the past 2160 hour(s))  Basic metabolic panel     Status: Abnormal   Collection Time: 09/15/18 10:54 AM  Result Value Ref Range   Sodium 136 135 - 145 mEq/L   Potassium 4.8 3.5 - 5.1 mEq/L   Chloride 101 96 - 112 mEq/L   CO2 27 19 - 32 mEq/L   Glucose, Bld 242 (H) 70 - 99 mg/dL   BUN 14 6 - 23 mg/dL   Creatinine, Ser 0.93 0.40 - 1.50 mg/dL   Calcium 9.4 8.4 - 10.5 mg/dL   GFR 102.87 >60.00 mL/min  CBC with Differential/Platelet     Status: None   Collection Time: 09/29/18 11:01 AM  Result Value Ref Range   WBC 7.1 4.0 - 10.5 K/uL   RBC 5.11 4.22 - 5.81 Mil/uL   Hemoglobin 13.7 13.0 - 17.0 g/dL   HCT 41.9 39.0 - 52.0 %   MCV 81.9 78.0 - 100.0 fl   MCHC 32.6 30.0 - 36.0 g/dL   RDW 14.3 11.5 - 15.5 %   Platelets 369.0 150.0 - 400.0 K/uL   Neutrophils Relative % 58.3 43.0 - 77.0 %   Lymphocytes Relative 30.8 12.0 - 46.0 %   Monocytes Relative 9.6 3.0 - 12.0 %   Eosinophils Relative 1.0 0.0 - 5.0 %   Basophils Relative 0.3 0.0 - 3.0 %  Neutro Abs 4.1 1.4 - 7.7 K/uL   Lymphs Abs 2.2 0.7 - 4.0 K/uL   Monocytes Absolute 0.7 0.1 - 1.0 K/uL   Eosinophils Absolute 0.1 0.0 - 0.7 K/uL   Basophils Absolute 0.0 0.0 - 0.1 K/uL    Assessment/Plan: 1. Controlled type 2 diabetes mellitus  without complication, without long-term current use of insulin (HCC) Repeat BMP and A1C today now that is due. Again declines influenza and pneumococcal vaccines. Foot exam UTD. Work accomodation form written for his peripheral neuropathy. Demonstrated dialing of the Ozempic pen to 0.5 mg dose. He will increase to this starting next dose.  - Basic metabolic panel - Hemoglobin A1c   Leeanne Rio, [place]

## 2018-11-28 NOTE — Patient Instructions (Signed)
Please go to the lab today for blood work.  I will call you with your results. We will alter treatment regimen(s) if indicated by your results.    Diabetes Mellitus and Exercise Exercising regularly is important for your overall health, especially when you have diabetes (diabetes mellitus). Exercising is not only about losing weight. It has many other health benefits, such as increasing muscle strength and bone density and reducing body fat and stress. This leads to improved fitness, flexibility, and endurance, all of which result in better overall health. Exercise has additional benefits for people with diabetes, including:  Reducing appetite.  Helping to lower and control blood glucose.  Lowering blood pressure.  Helping to control amounts of fatty substances (lipids) in the blood, such as cholesterol and triglycerides.  Helping the body to respond better to insulin (improving insulin sensitivity).  Reducing how much insulin the body needs.  Decreasing the risk for heart disease by: ? Lowering cholesterol and triglyceride levels. ? Increasing the levels of good cholesterol. ? Lowering blood glucose levels. What is my activity plan? Your health care provider or certified diabetes educator can help you make a plan for the type and frequency of exercise (activity plan) that works for you. Make sure that you:  Do at least 150 minutes of moderate-intensity or vigorous-intensity exercise each week. This could be brisk walking, biking, or water aerobics. ? Do stretching and strength exercises, such as yoga or weightlifting, at least 2 times a week. ? Spread out your activity over at least 3 days of the week.  Get some form of physical activity every day. ? Do not go more than 2 days in a row without some kind of physical activity. ? Avoid being inactive for more than 30 minutes at a time. Take frequent breaks to walk or stretch.  Choose a type of exercise or activity that you enjoy,  and set realistic goals.  Start slowly, and gradually increase the intensity of your exercise over time. What do I need to know about managing my diabetes?   Check your blood glucose before and after exercising. ? If your blood glucose is 240 mg/dL (13.3 mmol/L) or higher before you exercise, check your urine for ketones. If you have ketones in your urine, do not exercise until your blood glucose returns to normal. ? If your blood glucose is 100 mg/dL (5.6 mmol/L) or lower, eat a snack containing 15-20 grams of carbohydrate. Check your blood glucose 15 minutes after the snack to make sure that your level is above 100 mg/dL (5.6 mmol/L) before you start your exercise.  Know the symptoms of low blood glucose (hypoglycemia) and how to treat it. Your risk for hypoglycemia increases during and after exercise. Common symptoms of hypoglycemia can include: ? Hunger. ? Anxiety. ? Sweating and feeling clammy. ? Confusion. ? Dizziness or feeling light-headed. ? Increased heart rate or palpitations. ? Blurry vision. ? Tingling or numbness around the mouth, lips, or tongue. ? Tremors or shakes. ? Irritability.  Keep a rapid-acting carbohydrate snack available before, during, and after exercise to help prevent or treat hypoglycemia.  Avoid injecting insulin into areas of the body that are going to be exercised. For example, avoid injecting insulin into: ? The arms, when playing tennis. ? The legs, when jogging.  Keep records of your exercise habits. Doing this can help you and your health care provider adjust your diabetes management plan as needed. Write down: ? Food that you eat before and after you exercise. ?  Blood glucose levels before and after you exercise. ? The type and amount of exercise you have done. ? When your insulin is expected to peak, if you use insulin. Avoid exercising at times when your insulin is peaking.  When you start a new exercise or activity, work with your health care  provider to make sure the activity is safe for you, and to adjust your insulin, medicines, or food intake as needed.  Drink plenty of water while you exercise to prevent dehydration or heat stroke. Drink enough fluid to keep your urine clear or pale yellow. Summary  Exercising regularly is important for your overall health, especially when you have diabetes (diabetes mellitus).  Exercising has many health benefits, such as increasing muscle strength and bone density and reducing body fat and stress.  Your health care provider or certified diabetes educator can help you make a plan for the type and frequency of exercise (activity plan) that works for you.  When you start a new exercise or activity, work with your health care provider to make sure the activity is safe for you, and to adjust your insulin, medicines, or food intake as needed. This information is not intended to replace advice given to you by your health care provider. Make sure you discuss any questions you have with your health care provider. Document Released: 05/12/2003 Document Revised: 09/13/2016 Document Reviewed: 08/01/2015 Elsevier Patient Education  2020 [employer].

## 2018-11-29 LAB — BASIC METABOLIC PANEL
BUN: 10 mg/dL (ref 7–25)
CO2: 26 mmol/L (ref 20–32)
Calcium: 9.4 mg/dL (ref 8.6–10.3)
Chloride: 105 mmol/L (ref 98–110)
Creat: 0.84 mg/dL (ref 0.70–1.33)
Glucose, Bld: 144 mg/dL — ABNORMAL HIGH (ref 65–99)
Potassium: 4.2 mmol/L (ref 3.5–5.3)
Sodium: 141 mmol/L (ref 135–146)

## 2018-11-29 LAB — HEMOGLOBIN A1C
Hgb A1c MFr Bld: 7.9 % of total Hgb — ABNORMAL HIGH (ref <5.7)
Mean Plasma Glucose: 180 (calc)
eAG (mmol/L): 10 (calc)

## 2019-01-05 ENCOUNTER — Other Ambulatory Visit: Payer: Self-pay | Admitting: Emergency Medicine

## 2019-01-05 ENCOUNTER — Telehealth: Payer: Self-pay | Admitting: Physician Assistant

## 2019-01-05 DIAGNOSIS — E785 Hyperlipidemia, unspecified: Secondary | ICD-10-CM

## 2019-01-05 DIAGNOSIS — I10 Essential (primary) hypertension: Secondary | ICD-10-CM

## 2019-01-05 MED ORDER — AMLODIPINE-ATORVASTATIN 5-10 MG PO TABS: 1.0000 | ORAL_TABLET | Freq: Every day | ORAL | 1 refills | Status: DC

## 2019-01-05 NOTE — Telephone Encounter (Signed)
Patient called saying that he needs refill request for his Amlodipine-atorvastatin 5-10 mg. Please advise.

## 2019-01-05 NOTE — Telephone Encounter (Signed)
Refill sent to [employer] Patient assistance program.

## 2019-01-20 ENCOUNTER — Telehealth: Payer: Self-pay | Admitting: Physician Assistant

## 2019-01-20 NOTE — Telephone Encounter (Signed)
Re-faxed application to [employer] for patient assistance program for [employer]. Waiting on response from [employer]

## 2019-01-20 NOTE — Telephone Encounter (Signed)
Pt called in stating that the application to Caduet was sent in to early. He states that it needs to be sent in again and that he has one more 90 day supply of the medication to be sent to him. Please advise

## 2019-02-02 NOTE — Telephone Encounter (Signed)
Re-submitted a new application for the [employer] for Caduet. New application completed, insurance, medication list, and financials sent with application. Awaiting response from [employer]

## 2019-02-17 NOTE — Telephone Encounter (Signed)
[employer] today for update of application. They need a income verification to complete the application.  Spoke with patient today and he will e-mail a copy of check stub so we can submit

## 2019-02-23 NOTE — Telephone Encounter (Signed)
[place] notify office by fax of patient been enrolled in the patient assistance program. Expiration date is 03/04/2020.

## 2019-02-28 ENCOUNTER — Other Ambulatory Visit: Payer: Self-pay | Admitting: Physician Assistant

## 2019-03-18 ENCOUNTER — Encounter: Payer: Self-pay | Admitting: Physician Assistant

## 2019-03-18 ENCOUNTER — Other Ambulatory Visit: Payer: Self-pay

## 2019-03-18 ENCOUNTER — Ambulatory Visit (INDEPENDENT_AMBULATORY_CARE_PROVIDER_SITE_OTHER): Payer: Commercial Managed Care - PPO | Admitting: Physician Assistant

## 2019-03-18 VITALS — BP 130/84 | HR 83 | Temp 98.5°F | Resp 16 | Ht 71.0 in | Wt 291.8 lb

## 2019-03-18 DIAGNOSIS — H05811 Cyst of right orbit: Secondary | ICD-10-CM

## 2019-03-18 DIAGNOSIS — E119 Type 2 diabetes mellitus without complications: Secondary | ICD-10-CM | POA: Diagnosis not present

## 2019-03-18 LAB — COMPREHENSIVE METABOLIC PANEL
ALT: 40 U/L (ref 0–53)
AST: 31 U/L (ref 0–37)
Albumin: 4.3 g/dL (ref 3.5–5.2)
Alkaline Phosphatase: 81 U/L (ref 39–117)
BUN: 9 mg/dL (ref 6–23)
CO2: 28 mEq/L (ref 19–32)
Calcium: 9.5 mg/dL (ref 8.4–10.5)
Chloride: 103 mEq/L (ref 96–112)
Creatinine, Ser: 0.85 mg/dL (ref 0.40–1.50)
GFR: 113.9 mL/min (ref >60.00)
Glucose, Bld: 177 mg/dL — ABNORMAL HIGH (ref 70–99)
Potassium: 4.3 mEq/L (ref 3.5–5.1)
Sodium: 140 mEq/L (ref 135–145)
Total Bilirubin: 0.5 mg/dL (ref 0.2–1.2)
Total Protein: 6.9 g/dL (ref 6.0–8.3)

## 2019-03-18 NOTE — Progress Notes (Signed)
Patient presents to clinic today c/o bump of of right present for 3 days.  Notes the area got bigger and last night the area opened up and drained while he was in the shower.  Patient notes resolution of tenderness.  No residual drainage from the area.  Notes mild puffiness of upper eye lid hit and orbit this morning that resolved on its own.  Denies eye pain.  Denies vision changes.  Denies any trauma or injury to the area.  Patient also requesting refill of his tizanidine.  Uses on a very rare basis for muscle tension/spasm.  Last refill was in September 2020 with 15 tablets.  Patient also due for follow-up of type 2 diabetes mellitus.  Patient is currently on a regimen of Ozempic once weekly and Metformin 1000 mg twice daily.  Patient endorses taking medication as directed.  Does not check glucose at home.  Is taking blood pressure and cholesterol medication as directed.  Denies vision changes.  Notes last eye exam in August 2020.  Will need to obtain records.  Foot exam up-to-date.  Declines pneumococcal and influenza vaccines.  Past Medical History:  Diagnosis Date  . Allergy   . DM (diabetes mellitus) ([place])    type 2  . High cholesterol   . Hypertension   . Low testosterone     Current Outpatient Medications on File Prior to Visit  Medication Sig Dispense Refill  . acetaminophen (TYLENOL) 325 MG tablet Take 2 tablets (650 mg total) by mouth every 6 (six) hours as needed for mild pain or headache (fever >/= 101).    Marland Kitchen amLODipine-atorvastatin (CADUET) 5-10 MG tablet Take 1 tablet by mouth daily. 90 tablet 1  . aspirin EC 81 MG tablet Take 1 tablet by mouth daily.    Marland Kitchen atorvastatin (LIPITOR) 20 MG tablet Take 1 tablet by mouth once daily 90 tablet 1  . Blood Glucose Monitoring Suppl (ONETOUCH VERIO FLEX SYSTEM) w/Device KIT 1 Units by Does not apply route daily. Dx:E11.9 1 kit 0  . fluticasone (FLONASE) 50 MCG/ACT nasal spray Place 2 sprays into both nostrils daily as needed for  allergies or rhinitis. 16 g 6  . glucose blood test strip Use as instructed daily Dx: E11.9 100 each 3  . metFORMIN (GLUCOPHAGE) 1000 MG tablet TAKE 1 TABLET BY MOUTH TWICE DAILY WITH A MEAL 180 tablet 1  . multivitamin (ONE-A-DAY MEN'S) TABS tablet Take 1 tablet by mouth daily.    Glory Rosebush DELICA LANCETS 33I MISC 1 Stick by Does not apply route daily. Dx:E11.9 100 each 3  . Semaglutide,0.25 or 0.5MG/DOS, (OZEMPIC, 0.25 OR 0.5 MG/DOSE,) 2 MG/1.5ML SOPN Inject 0.5 mg into the skin once a week. 4 pen 1  . sildenafil (VIAGRA) 50 MG tablet TAKE 2 TABLETS BY MOUTH DAILY AS NEEDED FOR ERECTILE DYSFUNCTION. 4 tablet 11  . tiZANidine (ZANAFLEX) 4 MG tablet Take 1 tablet (4 mg total) by mouth every 8 (eight) hours as needed for muscle spasms. 15 tablet 0  . vitamin C (VITAMIN C) 500 MG tablet Take 1 tablet (500 mg total) by mouth daily. Please take for 2 weeks    . zinc sulfate 220 (50 Zn) MG capsule Take 1 capsule (220 mg total) by mouth daily. Please take for 2 weeks     No current facility-administered medications on file prior to visit.    Allergies  Allergen Reactions  . Avelox [Moxifloxacin Hcl In Nacl] Hives and Itching  . Doxycycline Rash    Family  History  Problem Relation Age of Onset  . Aneurysm Mother 11       Deceased  . Healthy Father        Living  . Prostate cancer Paternal Uncle   . Healthy Daughter        x3  . Colon cancer Neg Hx   . Esophageal cancer Neg Hx   . Rectal cancer Neg Hx   . Stomach cancer Neg Hx     Social History   Socioeconomic History  . Marital status: Single    Spouse name: Not on file  . Number of children: Not on file  . Years of education: Not on file  . Highest education level: Not on file  Occupational History  . Not on file  Tobacco Use  . Smoking status: Former Smoker    Packs/day: 0.50    Years: 15.00    Pack years: 7.50    Quit date: 04/17/2005    Years since quitting: 13.9  . Smokeless tobacco: Never Used  Substance and  Sexual Activity  . Alcohol use: No  . Drug use: No  . Sexual activity: Yes  Other Topics Concern  . Not on file  Social History Narrative  . Not on file   Social Determinants of Health   Financial Resource Strain:   . Difficulty of Paying Living Expenses: Not on file  Food Insecurity:   . Worried About Charity fundraiser in the Last Year: Not on file  . Ran Out of Food in the Last Year: Not on file  Transportation Needs:   . Lack of Transportation (Medical): Not on file  . Lack of Transportation (Non-Medical): Not on file  Physical Activity:   . Days of Exercise per Week: Not on file  . Minutes of Exercise per Session: Not on file  Stress:   . Feeling of Stress : Not on file  Social Connections:   . Frequency of Communication with Friends and Family: Not on file  . Frequency of Social Gatherings with Friends and Family: Not on file  . Attends Religious Services: Not on file  . Active Member of Clubs or Organizations: Not on file  . Attends Archivist Meetings: Not on file  . Marital Status: Not on file   Review of Systems - See HPI.  All other ROS are negative.  BP 130/84   Pulse 83   Temp 98.5 F (36.9 C) (Temporal)   Resp 16   Ht 5' 11"  (1.803 m)   Wt 291 lb 12.8 oz (132.4 kg)   SpO2 98%   BMI 40.70 kg/m   Physical Exam Vitals reviewed.  Constitutional:      Appearance: Normal appearance.  HENT:     Head: Normocephalic and atraumatic.  Eyes:     General: Lids are normal. Vision grossly intact.        Right eye: No discharge or hordeolum.     Extraocular Movements:     Right eye: Abnormal extraocular motion present.     Conjunctiva/sclera:     Right eye: Right conjunctiva is not injected. No chemosis.  Cardiovascular:     Rate and Rhythm: Normal rate and regular rhythm.     Pulses: Normal pulses.     Heart sounds: Normal heart sounds.  Musculoskeletal:     Cervical back: Neck supple.  Neurological:     Mental Status: He is alert.    Psychiatric:        Mood and  Affect: Mood normal.      Assessment/Plan: 1. Controlled type 2 diabetes mellitus without complication, without long-term current use of insulin (HCC) Taking medications as directed.  Eye exam and foot exam up-to-date.  Declines flu and pneumonia vaccinations.  Will obtain labs today to include CMP and A1c today.  Continue ARB, statin and current antidiabetic regimen.  We will make further changes based on lab results. - Comp Met (CMET) - Hemoglobin A1c  2. Cyst of orbit, right Resolving.  No sign of continued drainage.  No noted fluctuance or induration.  Area is resolving.  Continue warm compresses.  Apply very thin layer of triple antibiotic ointment over the area to protect and prevent infection.  Signs/symptoms of infection reviewed with patient.  He is to contact us immediately if any of these occur or new symptoms develop.  This visit occurred during the SARS-CoV-2 public health emergency.  Safety protocols were in place, including screening questions prior to the visit, additional usage of staff PPE, and extensive cleaning of exam room while observing appropriate contact time as indicated for disinfecting solutions.     Leeanne Rio, PA-C

## 2019-03-18 NOTE — Patient Instructions (Signed)
Please go to the lab today for blood work.  I will call you with your results. We will alter treatment regimen(s) if indicated by your results.   Please keep skin clean and dry.  Apply warm compresses to the area 2-3 x day for 10 minutes over the next few days. Apply small amount of Neosporin or Bacitracin to the area to protect the skin and help prevent infection.  This should continue to resolve in the next few days. If anything recurs or new symptoms develop, let me know.   For the ear pressure noted when taking certain trips, this is secondary to change in elevation. Chew gum on these trips to help pop the ears. Also get an over-the-counter Flonase to use before, during and after trip if needed.   Hang in there!

## 2019-03-19 ENCOUNTER — Other Ambulatory Visit: Payer: Self-pay | Admitting: Physician Assistant

## 2019-03-19 DIAGNOSIS — E119 Type 2 diabetes mellitus without complications: Secondary | ICD-10-CM

## 2019-03-19 LAB — HEMOGLOBIN A1C: Hgb A1c MFr Bld: 6.6 % — ABNORMAL HIGH (ref 4.6–6.5)

## 2019-03-24 ENCOUNTER — Telehealth: Payer: Self-pay

## 2019-03-24 NOTE — Telephone Encounter (Signed)
Paperwork in folder for review and completion

## 2019-03-24 NOTE — Telephone Encounter (Signed)
FMLA form and charge sheet placed in bin

## 2019-03-25 DIAGNOSIS — Z0279 Encounter for issue of other medical certificate: Secondary | ICD-10-CM

## 2019-03-25 NOTE — Telephone Encounter (Signed)
Paperwork up front for patient to pick up

## 2019-03-25 NOTE — Telephone Encounter (Signed)
Paperwork completed. Ready for fax. Charge sheet to be given to front office staff.

## 2019-03-26 ENCOUNTER — Telehealth: Payer: Self-pay | Admitting: Physician Assistant

## 2019-03-26 NOTE — Telephone Encounter (Signed)
Pt called in stating that the Ozempic is still going to be over $600 even with a coupons. He wanted to know if this could be changed to something else. Pt can be reached at the home #

## 2019-03-26 NOTE — Telephone Encounter (Signed)
Patient has a deductible with the new year. Ozempic will cost $600 with co-pay card. Please advise of recommendations or medication changes

## 2019-03-27 ENCOUNTER — Encounter: Payer: Self-pay | Admitting: Emergency Medicine

## 2019-03-27 NOTE — Telephone Encounter (Signed)
Would recommend he contact his insurance to see what alternatives to the Ozempic they will cover. Usually they will cover 1 medication in the same class or one in a similar class of medication. This will help [place] best know where to go from here.

## 2019-03-27 NOTE — Telephone Encounter (Signed)
Sent a my chart message to patient about contacting his insurance for alternative medications that is covered

## 2019-04-01 NOTE — Telephone Encounter (Signed)
Left message on patient VM that he can contact his insurance company to determine which medications are in the same class as the Ozempic and that is covered. Hard to determine or change medication when you have a deductible to meet before they will cover the medications.

## 2019-04-03 ENCOUNTER — Telehealth: Payer: Self-pay | Admitting: Physician Assistant

## 2019-04-03 NOTE — Telephone Encounter (Signed)
Pt called in stating that the Ozempic is still going to cost him over $500 he wanted to talk with Patina about what other options he has or if he could go back on the [place]. Pt can be reached at the home #

## 2019-04-03 NOTE — Telephone Encounter (Signed)
LMOVM for patient is the Ozempic going to cost him $500 or is that his deductible he has to meet? Edwin [name] is ok with restarting the Januvia but his glucose levels has improved with Ozempic. Has he spoken with his insurance co about preferred medication.

## 2019-04-06 NOTE — Telephone Encounter (Signed)
I have completed my portion of the patient assistance forms. I have placed his papers up front for him to review, complete, sign and attach necessary documentation. Once he returns to [place] we will get sent in for him.

## 2019-04-06 NOTE — Telephone Encounter (Signed)
LMOVM advising patient that paperwork is ready for pick up to be completed.

## 2019-04-06 NOTE — Telephone Encounter (Signed)
There are a few options here: (1) Can switch back to Januvia but sugars were not controlled well on this regimen. (2) Use a voucher card ofr the Ozempic to lower his out of pocket cost while the full amount of medication still works towards his deductible. Once met the medication should be fully covered.  (3) Attempt a savings program for the Ozempic (4) Have him check with his insurance to see if an Ozempic alternative is covered -- Trulicity, Victoza.

## 2019-04-06 NOTE — Telephone Encounter (Signed)
Patient walked into office today to discuss everything going on with his insurance. States that he spoke to insurance and they told him it did not matter what medication was sent in, since he has not met his deductible for the year, his copay for 90 day supply would be over $2k.  Patient states that he is willing to restart Januvia, if [name] can get him back into the payment program he was previously in. Informed patient that I would route message to PCP.

## 2019-04-06 NOTE — Telephone Encounter (Signed)
Patient is aware to pick up paperwork.

## 2019-04-06 NOTE — Telephone Encounter (Signed)
Spoke with patient, he is OK with trying to enroll in savings program for Ozempic.

## 2019-04-07 NOTE — Telephone Encounter (Signed)
Paperwork completed by patient and PCP and faxed to [employer]

## 2019-04-13 ENCOUNTER — Telehealth: Payer: Self-pay | Admitting: Physician Assistant

## 2019-04-13 NOTE — Telephone Encounter (Signed)
Received denial letter today regarding patient assistance for medication due to patient having.  Again please see how he would like to proceed --unfortunately it sounds like there is going to be no prescription coverage until he meets his deductible.  Once it is met, think should be covered.  The Ozempic is worked very well for him.  Would he like to continue this or does he want [place] to go back to previous medications that are cheaper per month but do not work as well and he will still have to pay out of pocket for these until deductible is met?

## 2019-04-15 NOTE — Telephone Encounter (Signed)
Patient states that he is going to contact insurance once more for the Ozempic. If nothing changes, he will go back to Januvia. States he will call us back to let us know his decision.

## 2019-04-15 NOTE — Telephone Encounter (Signed)
FYI

## 2019-04-15 NOTE — Telephone Encounter (Signed)
Left a detailed message on patient VM of patient assistance was denied.  PCP would like to continue the Ozempic since A1C and blood sugar readings have improved. We can go back to the previous medication which is cheaper but didn't control blood sugars as well.  Let us know which option to do.

## 2019-05-01 NOTE — Telephone Encounter (Signed)
Per previous note patient was to check with insurance regarding the Ozempic, Trulicity and Victoza. Never heard back from him regarding this. Did he check on this? If so and no coverage or if not we can just restart the Januvia at 100 mg and have him follow-up with me via video in 3-4 weeks after restarting.

## 2019-05-01 NOTE — Telephone Encounter (Signed)
Patient calling back in wanting know which medication he needs to proceed with taking. He prefers Januvia because it was previously free.

## 2019-05-01 NOTE — Telephone Encounter (Signed)
LMOVM to return call.

## 2019-05-04 ENCOUNTER — Other Ambulatory Visit: Payer: Self-pay

## 2019-05-04 MED ORDER — SITAGLIPTIN PHOSPHATE 100 MG PO TABS: 100.0000 mg | ORAL_TABLET | Freq: Every day | ORAL | 0 refills | Status: DC

## 2019-05-04 NOTE — Telephone Encounter (Signed)
Spoke with patient. Aware that 1 month supply Januvia 100 mg sent to pharmacy. He is going to pick up medication to see how much it is going to cost. Possibly wanting Patina to re-enroll him into a patient assistance program for the medication depending on cost.

## 2019-05-04 NOTE — Telephone Encounter (Signed)
Patient called back and states the medication is 462 dollars. Patient assistance? Patient states if you need any information just call him.

## 2019-05-04 NOTE — Telephone Encounter (Signed)
My Chart message sent

## 2019-05-07 ENCOUNTER — Telehealth: Payer: Self-pay

## 2019-05-07 DIAGNOSIS — E119 Type 2 diabetes mellitus without complications: Secondary | ICD-10-CM

## 2019-05-07 NOTE — Telephone Encounter (Signed)
Ok to place referral.

## 2019-05-07 NOTE — Telephone Encounter (Signed)
Referral placed to Podiatry for patient to have yearly diabetic foot exam

## 2019-05-07 NOTE — Telephone Encounter (Signed)
In reviewing patient chart and speaking to staff member who took the message. Patient is requesting another referral to Podiatry for yearly foot exams. He saw Podiatry 06/02/18.  Is ok to place referral for yearly exam?

## 2019-05-07 NOTE — Addendum Note (Signed)
Addended by: Leonidas Romberg on: 05/07/2019 01:50 PM   Modules accepted: Orders

## 2019-05-07 NOTE — Telephone Encounter (Signed)
Patient called in stating he would like to be scheduled for a foot exam.

## 2019-05-08 ENCOUNTER — Telehealth: Payer: Self-pay | Admitting: Physician Assistant

## 2019-05-08 NOTE — Telephone Encounter (Signed)
Will start patient assistance paperwork for Januvia

## 2019-05-08 NOTE — Telephone Encounter (Signed)
Pt called in stating that Patina was going to resubmit his medication assistance forms, he wanted to know if she needs any additional information from him. Please advise and pt can be reached at the home #

## 2019-05-08 NOTE — Telephone Encounter (Signed)
Application is being complete, patient does need to sign the application before faxing. Will notify patient that he needs to sign the application before faxing.

## 2019-05-12 ENCOUNTER — Telehealth: Payer: Self-pay | Admitting: Physician Assistant

## 2019-05-12 NOTE — Telephone Encounter (Signed)
Pt called in to make cody aware that he is going to get his Covid vac. On Friday

## 2019-05-13 ENCOUNTER — Encounter: Payer: Self-pay | Admitting: Podiatry

## 2019-05-13 ENCOUNTER — Ambulatory Visit (INDEPENDENT_AMBULATORY_CARE_PROVIDER_SITE_OTHER): Payer: Commercial Managed Care - PPO | Admitting: Podiatry

## 2019-05-13 ENCOUNTER — Encounter: Payer: Self-pay | Admitting: Emergency Medicine

## 2019-05-13 ENCOUNTER — Other Ambulatory Visit: Payer: Self-pay

## 2019-05-13 VITALS — Temp 97.3°F

## 2019-05-13 DIAGNOSIS — E0843 Diabetes mellitus due to underlying condition with diabetic autonomic (poly)neuropathy: Secondary | ICD-10-CM

## 2019-05-13 LAB — HM DIABETES EYE EXAM

## 2019-05-13 NOTE — Telephone Encounter (Signed)
Patient stopped by the office to sign paperwork. We have to mail application to [employer].

## 2019-05-13 NOTE — Progress Notes (Signed)
This patient presents to the office with chief complaint of  diabetic feet.  This patient  says there  is  no pain and discomfort in his  feet.    Patient has no history of infection or drainage from both feet.   . This patient presents  to the office today for  a foot evaluation due to history of  diabetes.  General Appearance  Alert, conversant and in no acute stress.  Vascular  Dorsalis pedis and posterior tibial  pulses are palpable  bilaterally.  Capillary return is within normal limits  bilaterally. Temperature is within normal limits  bilaterally.  Neurologic  Senn-Weinstein monofilament wire test within normal limits  bilaterally. Vibratory sensation diminished.  Nails Thick disfigured discolored nails with subungual debris  from hallux to fifth toes bilaterally. No evidence of bacterial infection or drainage bilaterally.  Orthopedic  No limitations of motion of motion feet .  No crepitus or effusions noted.  No bony pathology or digital deformities noted. 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  Skin  normotropic skin with no porokeratosis noted bilaterally.  No signs of infections or ulcers noted.      Diabetes with no foot complications  Diabetic foot exam reveals no evidence of vascular or neurologic pathology.  RTC 1 year   Gardiner Barefoot DPM

## 2019-05-21 ENCOUNTER — Telehealth: Payer: Self-pay | Admitting: Physician Assistant

## 2019-05-21 NOTE — Telephone Encounter (Signed)
Received rx request from [place] for patient Metformin and Atorvastatin dosage Form completed in PCP folder for signature to be faxed to [facility]

## 2019-05-21 NOTE — Telephone Encounter (Signed)
Pt called in stating that he needs his scripts sent [facility] mail order. Pt can be reached at the home #   Phone # [phone]

## 2019-05-22 ENCOUNTER — Telehealth: Payer: Self-pay

## 2019-05-22 DIAGNOSIS — E785 Hyperlipidemia, unspecified: Secondary | ICD-10-CM

## 2019-05-22 DIAGNOSIS — E119 Type 2 diabetes mellitus without complications: Secondary | ICD-10-CM

## 2019-05-22 DIAGNOSIS — I10 Essential (primary) hypertension: Secondary | ICD-10-CM

## 2019-05-22 MED ORDER — SILDENAFIL CITRATE 50 MG PO TABS: ORAL_TABLET | ORAL | 11 refills | Status: DC

## 2019-05-22 MED ORDER — ATORVASTATIN CALCIUM 20 MG PO TABS: 20.0000 mg | ORAL_TABLET | Freq: Every day | ORAL | 1 refills | Status: DC

## 2019-05-22 MED ORDER — AMLODIPINE-ATORVASTATIN 5-10 MG PO TABS: 1.0000 | ORAL_TABLET | Freq: Every day | ORAL | 1 refills | Status: DC

## 2019-05-22 MED ORDER — METFORMIN HCL 1000 MG PO TABS: 1000.0000 mg | ORAL_TABLET | Freq: Two times a day (BID) | ORAL | 1 refills | Status: DC

## 2019-05-22 MED ORDER — SITAGLIPTIN PHOSPHATE 100 MG PO TABS: 100.0000 mg | ORAL_TABLET | Freq: Every day | ORAL | 1 refills | Status: DC

## 2019-05-22 NOTE — Telephone Encounter (Signed)
Faxed to [facility].

## 2019-05-22 NOTE — Telephone Encounter (Signed)
Patient needs his Rx's filled by [facility] [address] due to insurance. Patient also requested Caduet to be sent [place].

## 2019-05-22 NOTE — Telephone Encounter (Signed)
Reviewed, signed. Given to [place] for fax.

## 2019-05-25 NOTE — Telephone Encounter (Signed)
Medication were sent to the patient preferred pharmacy and refill request sent to the [place] patient assistance for the [place].

## 2019-05-28 NOTE — Telephone Encounter (Signed)
Reorder confirmation [phone] for patient Edwin [name] thru [employer] patient assistance.  Next reorder date is 08/04/19

## 2019-06-08 ENCOUNTER — Ambulatory Visit: Payer: Commercial Managed Care - PPO | Admitting: Physician Assistant

## 2019-06-08 NOTE — Telephone Encounter (Signed)
Patient Caduet came in today. Patient notified of medications/samples. Patient aware and picked up samples.  Called to check status of [employer] patient assistance for [employer]. Advised the process can take 3-4 weeks. Mailed off on 05/13/19. Will check back on 06/13/19.

## 2019-06-17 ENCOUNTER — Telehealth: Payer: Self-pay

## 2019-06-17 NOTE — Telephone Encounter (Signed)
Patient states that some information is missing off of paperwork . He will be bringing that by the office shortly. Patient states that he would like a refill on [id number] [place] [address] Phone:  [phone]  Fax:  [phone]     Patient also states that when he's traveling to certain areas he is unable to get his ears to pop. Patient states that he has tried chewing gum to help but nothing seem to work

## 2019-06-17 NOTE — Telephone Encounter (Signed)
Not sure why this was sent directly to me. Edwin [name] brought by paperwork -- looked like my license number was not written on the form. This has been corrected and will be resubmitted.   [place] for refill of Ibuprofen.   Ears popping due to eustachian tube dysfunction as bariatric pressure is changing. Can consider an OTC antihistamine versus OTC Flonase prior to heading to those elevations to see if that helps.

## 2019-06-18 NOTE — Addendum Note (Signed)
Addended by: Leonidas Romberg on: 06/18/2019 10:34 AM   Modules accepted: Orders

## 2019-06-18 NOTE — Telephone Encounter (Signed)
Left a detailed message on VM of PCP recommendations.  Updated the patient assistance paperwork with PCP license #  Refilled the Ibuprofen 600 mg to the pharmacy  Patient states he uses Flonase daily. He went to the [facility] and was chewing and yawning to make the ear pop. It did some but is not fully back to normal. He states he takes something OTC but unsure of the name.   Advised patient to keep appointment tomorrow for evaluation of ear.

## 2019-06-19 ENCOUNTER — Telehealth: Payer: Self-pay | Admitting: Physician Assistant

## 2019-06-19 ENCOUNTER — Other Ambulatory Visit: Payer: Self-pay

## 2019-06-19 ENCOUNTER — Encounter: Payer: Self-pay | Admitting: Physician Assistant

## 2019-06-19 ENCOUNTER — Ambulatory Visit (INDEPENDENT_AMBULATORY_CARE_PROVIDER_SITE_OTHER): Payer: Commercial Managed Care - PPO | Admitting: Physician Assistant

## 2019-06-19 VITALS — BP 120/70 | HR 92 | Temp 97.6°F | Resp 16 | Ht 71.0 in | Wt 295.0 lb

## 2019-06-19 DIAGNOSIS — H6982 Other specified disorders of Eustachian tube, left ear: Secondary | ICD-10-CM | POA: Diagnosis not present

## 2019-06-19 DIAGNOSIS — E119 Type 2 diabetes mellitus without complications: Secondary | ICD-10-CM

## 2019-06-19 MED ORDER — IBUPROFEN 600 MG PO TABS: 600.0000 mg | ORAL_TABLET | Freq: Three times a day (TID) | ORAL | 0 refills | Status: DC | PRN

## 2019-06-19 MED ORDER — SITAGLIPTIN PHOSPHATE 100 MG PO TABS: 100.0000 mg | ORAL_TABLET | Freq: Every day | ORAL | 3 refills | Status: DC

## 2019-06-19 NOTE — Patient Instructions (Addendum)
Please continue Flonase once daily. Also start OTC Xyzal once daily with this.  Giving history of BP we want to avoid decongestants if possible.  If not improving over the weekend, switch to OTC Claritin-D to use as directed for 2-3 days.  If still not improving we will need to consider ENT assessment.   For glucose, I have sent in a short-term prescription of the Januvia to local pharmacy while you are waiting on supply from [employer].   I have submitted a form for a voucher for you -- will come to your e-mail.  Take this with you to see if this helps with things.

## 2019-06-19 NOTE — Telephone Encounter (Signed)
[facility] [address]

## 2019-06-19 NOTE — Progress Notes (Signed)
Patient presents to clinic today c/o pressure and popping of left ear since [place].  Has been using antihistamine and Flonase with only some relief of symptoms.  Denies overt ear pain.  Denies drainage from the ear or change in hearing.  Denies noting symptoms of right ear.  Denies fever, chills, malaise or fatigue.  Denies any sinus pressure, sinus pain or tooth pain.   Past Medical History:  Diagnosis Date  . Allergy   . DM (diabetes mellitus) ([place])    type 2  . High cholesterol   . Hypertension   . Low testosterone     Current Outpatient Medications on File Prior to Visit  Medication Sig Dispense Refill  . acetaminophen (TYLENOL) 325 MG tablet Take 2 tablets (650 mg total) by mouth every 6 (six) hours as needed for mild pain or headache (fever >/= 101).    Marland Kitchen amLODipine-atorvastatin (CADUET) 5-10 MG tablet Take 1 tablet by mouth daily. 90 tablet 1  . aspirin EC 81 MG tablet Take 1 tablet by mouth daily.    Marland Kitchen atorvastatin (LIPITOR) 20 MG tablet Take 1 tablet (20 mg total) by mouth daily. 90 tablet 1  . Blood Glucose Monitoring Suppl (ONETOUCH VERIO FLEX SYSTEM) w/Device KIT 1 Units by Does not apply route daily. Dx:E11.9 1 kit 0  . fluticasone (FLONASE) 50 MCG/ACT nasal spray Place 2 sprays into both nostrils daily as needed for allergies or rhinitis. 16 g 6  . glucose blood test strip Use as instructed daily Dx: E11.9 100 each 3  . metFORMIN (GLUCOPHAGE) 1000 MG tablet Take 1 tablet (1,000 mg total) by mouth 2 (two) times daily with a meal. 180 tablet 1  . multivitamin (ONE-A-DAY MEN'S) TABS tablet Take 1 tablet by mouth daily.    Glory Rosebush DELICA LANCETS 87G MISC 1 Stick by Does not apply route daily. Dx:E11.9 100 each 3  . sildenafil (VIAGRA) 50 MG tablet TAKE 2 TABLETS BY MOUTH DAILY AS NEEDED FOR ERECTILE DYSFUNCTION. 4 tablet 11  . tiZANidine (ZANAFLEX) 4 MG tablet TAKE 1 TABLET BY MOUTH EVERY 8 HOURS AS NEEDED FOR MUSCLE SPASM 15 tablet 0  . vitamin C  (VITAMIN C) 500 MG tablet Take 1 tablet (500 mg total) by mouth daily. Please take for 2 weeks    . zinc sulfate 220 (50 Zn) MG capsule Take 1 capsule (220 mg total) by mouth daily. Please take for 2 weeks    . Semaglutide,0.25 or 0.5MG/DOS, (OZEMPIC, 0.25 OR 0.5 MG/DOSE,) 2 MG/1.5ML SOPN Inject 0.5 mg into the skin once a week. (Patient not taking: Reported on 06/19/2019) 4 pen 1  . sitaGLIPtin (JANUVIA) 100 MG tablet Take 1 tablet (100 mg total) by mouth daily. (Patient not taking: Reported on 06/19/2019) 90 tablet 1   No current facility-administered medications on file prior to visit.    Allergies  Allergen Reactions  . Avelox [Moxifloxacin Hcl In Nacl] Hives and Itching  . Doxycycline Rash    Family History  Problem Relation Age of Onset  . Aneurysm Mother 33       Deceased  . Healthy Father        Living  . Prostate cancer Paternal Uncle   . Healthy Daughter        x3  . Colon cancer Neg Hx   . Esophageal cancer Neg Hx   . Rectal cancer Neg Hx   . Stomach cancer Neg Hx     Social History   Socioeconomic  History  . Marital status: Single    Spouse name: Not on file  . Number of children: Not on file  . Years of education: Not on file  . Highest education level: Not on file  Occupational History  . Not on file  Tobacco Use  . Smoking status: Former Smoker    Packs/day: 0.50    Years: 15.00    Pack years: 7.50    Quit date: 04/17/2005    Years since quitting: 14.1  . Smokeless tobacco: Never Used  Substance and Sexual Activity  . Alcohol use: No  . Drug use: No  . Sexual activity: Yes  Other Topics Concern  . Not on file  Social History Narrative  . Not on file   Social Determinants of Health   Financial Resource Strain:   . Difficulty of Paying Living Expenses:   Food Insecurity:   . Worried About Charity fundraiser in the Last Year:   . Arboriculturist in the Last Year:   Transportation Needs:   . Film/video editor (Medical):   Marland Kitchen Lack of  Transportation (Non-Medical):   Physical Activity:   . Days of Exercise per Week:   . Minutes of Exercise per Session:   Stress:   . Feeling of Stress :   Social Connections:   . Frequency of Communication with Friends and Family:   . Frequency of Social Gatherings with Friends and Family:   . Attends Religious Services:   . Active Member of Clubs or Organizations:   . Attends Archivist Meetings:   Marland Kitchen Marital Status:    Review of Systems - See HPI.  All other ROS are negative.  BP 120/70   Pulse 92   Temp 97.6 F (36.4 C) (Temporal)   Resp 16   Ht 5' 11"  (1.803 m)   Wt 295 lb (133.8 kg)   SpO2 98%   BMI 41.14 kg/m   Physical Exam Vitals reviewed.  Constitutional:      Appearance: Normal appearance.  HENT:     Head: Normocephalic and atraumatic.     Right Ear: Tympanic membrane normal.     Left Ear: A middle ear effusion (serous) is present. Tympanic membrane is retracted.  Cardiovascular:     Rate and Rhythm: Normal rate and regular rhythm.     Pulses: Normal pulses.     Heart sounds: Normal heart sounds.  Musculoskeletal:     Cervical back: Neck supple.  Neurological:     General: No focal deficit present.     Mental Status: He is alert.  Psychiatric:        Mood and Affect: Mood normal.     Recent Results (from the past 2160 hour(s))  HM DIABETES EYE EXAM     Status: None   Collection Time: 05/13/19 12:00 AM  Result Value Ref Range   HM Diabetic Eye Exam No Retinopathy No Retinopathy   Assessment/Plan: 1. Acute dysfunction of left eustachian tube We will continue Flonase daily.  We will have him switch to OTC Xyzal once daily.  Really want to avoid decongestants if possible given history of hypertension.  If not improving over the weekend we will have him switch Xyzal to OTC Claritin-D or Allegra-D for a few days, keeping close track of blood pressure.  If not improving/resolving will refer him to ENT.  This visit occurred during the SARS-CoV-2  public health emergency.  Safety protocols were in place, including screening questions prior  to the visit, additional usage of staff PPE, and extensive cleaning of exam room while observing appropriate contact time as indicated for disinfecting solutions.     Leeanne Rio, PA-C

## 2019-06-19 NOTE — Telephone Encounter (Signed)
MEDICATION: ibuprofen  600 MG tablet, sitagliptin Celesta Gentile) 100 MG  [facility] [address] [place]  Comments: Pt states prescriptions were sent to the wrong pharmacy  **Let patient know to contact pharmacy at the end of the day to make sure medication is ready. **  ** Please notify patient to allow 48-72 hours to process**  **Encourage patient to contact the pharmacy for refills or they can request refills through [facility]**

## 2019-06-24 ENCOUNTER — Ambulatory Visit: Payer: Commercial Managed Care - PPO | Admitting: Physician Assistant

## 2019-06-24 ENCOUNTER — Ambulatory Visit: Payer: Commercial Managed Care - PPO | Admitting: Podiatry

## 2019-08-11 ENCOUNTER — Telehealth: Payer: Self-pay | Admitting: Physician Assistant

## 2019-08-11 NOTE — Telephone Encounter (Signed)
Patient called stating he needs an updated letter like the one written on 11/28/18.  He is also needing this to include that he can not wear work boots due to sweating in the summer. States [name] has written a letter like this previously but I could not locate.  Patient states that his employer is needing updated letters.   Can this be completed outside of an OV or do we need to schedule appointment?  Please advise.

## 2019-08-13 NOTE — Telephone Encounter (Signed)
Since he is scheduled. Will discuss in detail with him and complete at time of appointment.

## 2019-08-13 NOTE — Telephone Encounter (Signed)
Pt has been scheduled for an OV on Friday with [place].

## 2019-08-14 ENCOUNTER — Encounter: Payer: Self-pay | Admitting: Physician Assistant

## 2019-08-14 ENCOUNTER — Other Ambulatory Visit (HOSPITAL_COMMUNITY)
Admission: RE | Admit: 2019-08-14 | Discharge: 2019-08-14 | Disposition: A | Discharge status: Home / Self Care | Payer: Commercial Managed Care - PPO | Source: Ambulatory Visit | Attending: Physician Assistant | Admitting: Physician Assistant

## 2019-08-14 ENCOUNTER — Other Ambulatory Visit: Payer: Self-pay

## 2019-08-14 ENCOUNTER — Ambulatory Visit (INDEPENDENT_AMBULATORY_CARE_PROVIDER_SITE_OTHER): Payer: Commercial Managed Care - PPO | Admitting: Physician Assistant

## 2019-08-14 VITALS — BP 140/80 | HR 70 | Temp 97.6°F | Ht 71.0 in | Wt 295.0 lb

## 2019-08-14 DIAGNOSIS — Z113 Encounter for screening for infections with a predominantly sexual mode of transmission: Secondary | ICD-10-CM

## 2019-08-14 DIAGNOSIS — E119 Type 2 diabetes mellitus without complications: Secondary | ICD-10-CM | POA: Diagnosis not present

## 2019-08-14 LAB — COMPREHENSIVE METABOLIC PANEL
ALT: 25 U/L (ref 0–53)
AST: 17 U/L (ref 0–37)
Albumin: 4.7 g/dL (ref 3.5–5.2)
Alkaline Phosphatase: 89 U/L (ref 39–117)
BUN: 8 mg/dL (ref 6–23)
CO2: 31 mEq/L (ref 19–32)
Calcium: 9.9 mg/dL (ref 8.4–10.5)
Chloride: 102 mEq/L (ref 96–112)
Creatinine, Ser: 0.92 mg/dL (ref 0.40–1.50)
GFR: 103.8 mL/min (ref >60.00)
Glucose, Bld: 140 mg/dL — ABNORMAL HIGH (ref 70–99)
Potassium: 4.3 mEq/L (ref 3.5–5.1)
Sodium: 138 mEq/L (ref 135–145)
Total Bilirubin: 0.7 mg/dL (ref 0.2–1.2)
Total Protein: 7.5 g/dL (ref 6.0–8.3)

## 2019-08-14 NOTE — Progress Notes (Signed)
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 Patient endorses he and his fiance just separated and he wants to get a full screen just to make certain everything is negative. Denies concerning symptoms. Denies any known exposure to STI.  Patient also due for his repeat A1c and urine microalbumin. Patient would like to get today since he is already having labs drawn. Patient with type 2 diabetes, previously very well controlled with Metformin 1000 mg twice daily, Januvia 100 mg daily and Ozempic once weekly. Endorses taking all medications as directed. Is up-to-date on foot exam. Declines pneumonia vaccine.  Past Medical History:  Diagnosis Date  . Allergy   . DM (diabetes mellitus) ([place])    type 2  . High cholesterol   . Hypertension   . Low testosterone     Current Outpatient Medications on File Prior to Visit  Medication Sig Dispense Refill  . acetaminophen (TYLENOL) 325 MG tablet Take 2 tablets (650 mg total) by mouth every 6 (six) hours as needed for mild pain or headache (fever >/= 101).    Marland Kitchen amLODipine-atorvastatin (CADUET) 5-10 MG tablet Take 1 tablet by mouth daily. 90 tablet 1  . aspirin EC 81 MG tablet Take 1 tablet by mouth daily.    Marland Kitchen atorvastatin (LIPITOR) 20 MG tablet Take 1 tablet (20 mg total) by mouth daily. 90 tablet 1  . Blood Glucose Monitoring Suppl (ONETOUCH VERIO FLEX SYSTEM) w/Device KIT 1 Units by Does not apply route daily. Dx:E11.9 1 kit 0  . fluticasone (FLONASE) 50 MCG/ACT nasal spray Place 2 sprays into both nostrils daily as needed for allergies or rhinitis. 16 g 6  . glucose blood test strip Use as instructed daily Dx: E11.9 100 each 3  . ibuprofen (ADVIL) 600 MG tablet Take 1 tablet (600 mg total) by mouth every 8 (eight) hours as needed. 30 tablet 0  . metFORMIN (GLUCOPHAGE) 1000 MG tablet Take 1 tablet (1,000 mg total) by mouth 2 (two) times daily with a meal. 180 tablet 1  .  multivitamin (ONE-A-DAY MEN'S) TABS tablet Take 1 tablet by mouth daily.    Glory Rosebush DELICA LANCETS 98X MISC 1 Stick by Does not apply route daily. Dx:E11.9 100 each 3  . Semaglutide,0.25 or 0.5MG/DOS, (OZEMPIC, 0.25 OR 0.5 MG/DOSE,) 2 MG/1.5ML SOPN Inject 0.5 mg into the skin once a week. 4 pen 1  . sildenafil (VIAGRA) 50 MG tablet TAKE 2 TABLETS BY MOUTH DAILY AS NEEDED FOR ERECTILE DYSFUNCTION. 4 tablet 11  . sitaGLIPtin (JANUVIA) 100 MG tablet Take 1 tablet (100 mg total) by mouth daily. 30 tablet 3  . tiZANidine (ZANAFLEX) 4 MG tablet TAKE 1 TABLET BY MOUTH EVERY 8 HOURS AS NEEDED FOR MUSCLE SPASM 15 tablet 0  . vitamin C (VITAMIN C) 500 MG tablet Take 1 tablet (500 mg total) by mouth daily. Please take for 2 weeks    . zinc sulfate 220 (50 Zn) MG capsule Take 1 capsule (220 mg total) by mouth daily. Please take for 2 weeks     No current facility-administered medications on file prior to visit.    Allergies  Allergen Reactions  . Avelox [Moxifloxacin Hcl In Nacl] Hives and Itching  . Doxycycline Rash    Family History  Problem Relation Age of Onset  . Aneurysm Mother 71       Deceased  . Healthy Father        Living  .  Prostate cancer Paternal Uncle   . Healthy Daughter        x3  . Colon cancer Neg Hx   . Esophageal cancer Neg Hx   . Rectal cancer Neg Hx   . Stomach cancer Neg Hx     Social History   Socioeconomic History  . Marital status: Single    Spouse name: Not on file  . Number of children: Not on file  . Years of education: Not on file  . Highest education level: Not on file  Occupational History  . Not on file  Tobacco Use  . Smoking status: Former Smoker    Packs/day: 0.50    Years: 15.00    Pack years: 7.50    Quit date: 04/17/2005    Years since quitting: 14.3  . Smokeless tobacco: Never Used  Vaping Use  . Vaping Use: Never used  Substance and Sexual Activity  . Alcohol use: No  . Drug use: No  . Sexual activity: Yes  Other Topics Concern    . Not on file  Social History Narrative  . Not on file   Social Determinants of Health   Financial Resource Strain:   . Difficulty of Paying Living Expenses:   Food Insecurity:   . Worried About Running Out of Food in the Last Year:   . Ran Out of Food in the Last Year:   Transportation Needs:   . Lack of Transportation (Medical):   . Lack of Transportation (Non-Medical):   Physical Activity:   . Days of Exercise per Week:   . Minutes of Exercise per Session:   Stress:   . Feeling of Stress :   Social Connections:   . Frequency of Communication with Friends and Family:   . Frequency of Social Gatherings with Friends and Family:   . Attends Religious Services:   . Active Member of Clubs or Organizations:   . Attends Club or Organization Meetings:   . Marital Status:    Review of Systems - See HPI.  All other ROS are negative.  BP 140/80   Pulse 70   Temp 97.6 F (36.4 C)   Ht 5' 11" (1.803 m)   Wt 295 lb (133.8 kg)   SpO2 99%   BMI 41.14 kg/m   Physical Exam Vitals reviewed.  Constitutional:      Appearance: Normal appearance.  HENT:     Head: Normocephalic and atraumatic.  Cardiovascular:     Rate and Rhythm: Normal rate and regular rhythm.     Heart sounds: Normal heart sounds.  Musculoskeletal:     Cervical back: Neck supple.  Neurological:     General: No focal deficit present.     Mental Status: He is alert and oriented to person, place, and time.  Psychiatric:        Mood and Affect: Mood normal.    Assessment/Plan: 1. Screen for STD (sexually transmitted disease) Asymptomatic. Will obtain full STI panel. Patient to refrain from any sexual activity until results are in. Safe sex practices reviewed with patient. - HIV Antibody (routine testing w rflx) - RPR - HSV(herpes smplx)abs-1+2(IgG+IgM)-bld - Urine cytology ancillary only([place])  2. Controlled type 2 diabetes mellitus without complication, without long-term current use of insulin  (HCC) Taking medications as directed. Last A1c less than 7. Repeat labs today including urine microalbumin. Continue current medication regimen. - Comp Met (CMET) - Hemoglobin A1c - Urine Microalbumin w/creat. ratio  This visit occurred during the SARS-CoV-2 public   health emergency.  Safety protocols were in place, including screening questions prior to the visit, additional usage of staff PPE, and extensive cleaning of exam room while observing appropriate contact time as indicated for disinfecting solutions.     Leeanne Rio, PA-C

## 2019-08-14 NOTE — Patient Instructions (Signed)
Please go to the lab today for blood work.  I will call you with your results. We will alter treatment regimen(s) if indicated by your results.   Keep up with diet and exercise!!

## 2019-08-17 LAB — URINE CYTOLOGY ANCILLARY ONLY
Chlamydia: NEGATIVE
Comment: NEGATIVE
Comment: NEGATIVE
Comment: NORMAL
Neisseria Gonorrhea: NEGATIVE
Trichomonas: NEGATIVE

## 2019-08-17 LAB — RPR: RPR Ser Ql: NONREACTIVE

## 2019-08-17 LAB — HIV ANTIBODY (ROUTINE TESTING W REFLEX): HIV 1&2 Ab, 4th Generation: NONREACTIVE

## 2019-08-17 LAB — HEMOGLOBIN A1C: Hgb A1c MFr Bld: 6.4 % (ref 4.6–6.5)

## 2019-08-18 LAB — HSV(HERPES SMPLX)ABS-I+II(IGG+IGM)-BLD
HSV 1 Glycoprotein G Ab, IgG: 48.1 index — ABNORMAL HIGH (ref 0.00–0.90)
HSV 2 IgG, Type Spec: 6.79 index — ABNORMAL HIGH (ref 0.00–0.90)
HSVI/II Comb IgM: <0.91 Ratio (ref 0.00–0.90)

## 2019-08-28 ENCOUNTER — Telehealth: Payer: Self-pay | Admitting: Physician Assistant

## 2019-08-28 NOTE — Telephone Encounter (Signed)
Please advise regarding sending in prescriptions through patient assistance.

## 2019-08-28 NOTE — Telephone Encounter (Signed)
Pt called in stating that he needs his Caduet and the Januvia to ordered. He is aware that [name] is out of the office today.

## 2019-09-01 ENCOUNTER — Other Ambulatory Visit: Payer: Self-pay

## 2019-09-01 DIAGNOSIS — E119 Type 2 diabetes mellitus without complications: Secondary | ICD-10-CM

## 2019-09-01 MED ORDER — SITAGLIPTIN PHOSPHATE 100 MG PO TABS: 100.0000 mg | ORAL_TABLET | Freq: Every day | ORAL | 3 refills | Status: DC

## 2019-09-01 NOTE — Telephone Encounter (Signed)
[facility]

## 2019-09-01 NOTE — Telephone Encounter (Signed)
Information on his Patient assistance is in clinical information in demographics -- phone number, patient ID, etc so that we can call and reorder.

## 2019-09-01 NOTE — Telephone Encounter (Signed)
Caduet reordered, expected in 7-10 business days. Order conf # is [id number].

## 2019-09-09 ENCOUNTER — Other Ambulatory Visit: Payer: Self-pay | Admitting: Emergency Medicine

## 2019-09-09 DIAGNOSIS — E785 Hyperlipidemia, unspecified: Secondary | ICD-10-CM

## 2019-09-09 DIAGNOSIS — I10 Essential (primary) hypertension: Secondary | ICD-10-CM

## 2019-09-09 MED ORDER — AMLODIPINE-ATORVASTATIN 5-10 MG PO TABS: 1.0000 | ORAL_TABLET | Freq: Every day | ORAL | 1 refills | Status: DC

## 2019-09-10 ENCOUNTER — Telehealth: Payer: Self-pay | Admitting: Physician Assistant

## 2019-09-10 DIAGNOSIS — J310 Chronic rhinitis: Secondary | ICD-10-CM

## 2019-09-10 DIAGNOSIS — H6982 Other specified disorders of Eustachian tube, left ear: Secondary | ICD-10-CM

## 2019-09-10 NOTE — Telephone Encounter (Signed)
Patient called and stated that the zyrtec is not helping his nasal congestion - is there something else he can try that might help

## 2019-09-11 NOTE — Telephone Encounter (Signed)
Patient states he is using the Flonase nasal spray and Zyrtec daily. He is agreeable with starting the Claritin-D for a few days but not too long can increase blood pressure. He is agreeable. Referral for ENT placed for sinuses.

## 2019-09-11 NOTE — Telephone Encounter (Signed)
Make sure he is using his nasal steroid spray as well. Can do a few days of Claritin-D -- do not want him on it longer term due to history of hypertension but a few days should be ok and will hopefully clear things up a bit. Would like to proceed with referral to ENT for him as well.

## 2019-09-25 ENCOUNTER — Telehealth: Payer: Self-pay | Admitting: Physician Assistant

## 2019-09-25 NOTE — Telephone Encounter (Signed)
Called [employer] patient assistance program for refill of the Januvia 100 mg. Spoke with [employer] will take 7-10 days. She has 2 RF left

## 2019-09-25 NOTE — Telephone Encounter (Signed)
Edwin [name] would like for you to order his Januvia - he said he is down to about 2 weeks left

## 2019-09-28 ENCOUNTER — Telehealth: Payer: Self-pay | Admitting: Physician Assistant

## 2019-09-28 NOTE — Telephone Encounter (Signed)
Can take OTC Tylenol or Motrin. Also recommend getting a cold can or water bottle and running under the heal of foot, back and forth. The cold will help with pain and inflammation and the rolling maneuver will help with tension in the fascia.

## 2019-09-28 NOTE — Telephone Encounter (Signed)
Spoke with patient about pain. He states the pain has moved from his feet to the calf region. He brought new shoes and they started hurting his feet. He has been taking the Ibuprofen 600 mg daily for the pain. He has ran out. Advised he can take 3 pills of the 200 mg Ibuprofen until appointment tomorrow

## 2019-09-28 NOTE — Telephone Encounter (Signed)
Patient was scheduled on Friday 7/30 for heel pain.  I have moved patient to 7/27 due to pain.  Patient would like to know if there is anything he can take for pain between now and tomorrow?

## 2019-09-29 ENCOUNTER — Other Ambulatory Visit: Payer: Self-pay | Admitting: Physician Assistant

## 2019-09-29 ENCOUNTER — Other Ambulatory Visit: Payer: Self-pay

## 2019-09-29 ENCOUNTER — Ambulatory Visit (INDEPENDENT_AMBULATORY_CARE_PROVIDER_SITE_OTHER): Payer: Commercial Managed Care - PPO | Admitting: Physician Assistant

## 2019-09-29 ENCOUNTER — Encounter: Payer: Self-pay | Admitting: Physician Assistant

## 2019-09-29 VITALS — BP 120/80 | HR 88 | Temp 98.3°F | Resp 16 | Ht 71.0 in | Wt 298.0 lb

## 2019-09-29 DIAGNOSIS — M722 Plantar fascial fibromatosis: Secondary | ICD-10-CM | POA: Diagnosis not present

## 2019-09-29 MED ORDER — TRIAMCINOLONE ACETONIDE 0.1 % EX CREA
1.0000 | TOPICAL_CREAM | Freq: Two times a day (BID) | CUTANEOUS | 0 refills | Status: DC
Start: 2019-09-29 — End: 2020-02-11

## 2019-09-29 MED ORDER — NAPROXEN 500 MG PO TABS: 500.0000 mg | ORAL_TABLET | Freq: Two times a day (BID) | ORAL | 1 refills | Status: DC

## 2019-09-29 NOTE — Patient Instructions (Signed)
Please take the Naprosyn twice daily with food. Tylenol if needed for breakthrough pain. Do the cold can exercises as directed. Start the stretches below. Make sure to wear shoes with good arch support. If not resolving, if anything worsens or new symptoms develop, please let me know as I would want you to have assessment with Podiatry.  Use the cream given twice daily for the next week.   Plantar Fasciitis Rehab Ask your health care provider which exercises are safe for you. Do exercises exactly as told by your health care provider and adjust them as directed. It is normal to feel mild stretching, pulling, tightness, or discomfort as you do these exercises. Stop right away if you feel sudden pain or your pain gets worse. Do not begin these exercises until told by your health care provider. Stretching and range-of-motion exercises These exercises warm up your muscles and joints and improve the movement and flexibility of your foot. These exercises also help to relieve pain. Plantar fascia stretch  1. Sit with your left / right leg crossed over your opposite knee. 2. Hold your heel with one hand with that thumb near your arch. With your other hand, hold your toes and gently pull them back toward the top of your foot. You should feel a stretch on the bottom of your toes or your foot (plantar fascia) or both. 3. Hold this stretch for__________ seconds. 4. Slowly release your toes and return to the starting position. Repeat __________ times. Complete this exercise __________ times a day. Gastrocnemius stretch, standing This exercise is also called a calf (gastroc) stretch. It stretches the muscles in the back of the upper calf. 1. Stand with your hands against a wall. 2. Extend your left / right leg behind you, and bend your front knee slightly. 3. Keeping your heels on the floor and your back knee straight, shift your weight toward the wall. Do not arch your back. You should feel a gentle  stretch in your upper left / right calf. 4. Hold this position for __________ seconds. Repeat __________ times. Complete this exercise __________ times a day. Soleus stretch, standing This exercise is also called a calf (soleus) stretch. It stretches the muscles in the back of the lower calf. 1. Stand with your hands against a wall. 2. Extend your left / right leg behind you, and bend your front knee slightly. 3. Keeping your heels on the floor, bend your back knee and shift your weight slightly over your back leg. You should feel a gentle stretch deep in your lower calf. 4. Hold this position for __________ seconds. Repeat __________ times. Complete this exercise __________ times a day. Gastroc and soleus stretch, standing step This exercise stretches the muscles in the back of the lower leg. These muscles are in the upper calf (gastrocnemius) and the lower calf (soleus). 1. Stand with the ball of your left / right foot on a step. The ball of your foot is on the walking surface, right under your toes. 2. Keep your other foot firmly on the same step. 3. Hold on to the wall or a railing for balance. 4. Slowly lift your other foot, allowing your body weight to press your left / right heel down over the edge of the step. You should feel a stretch in your left / right calf. 5. Hold this position for __________ seconds. 6. Return both feet to the step. 7. Repeat this exercise with a slight bend in your left / right knee. Repeat  __________ times with your left / right knee straight and __________ times with your left / right knee bent. Complete this exercise __________ times a day. Balance exercise This exercise builds your balance and strength control of your arch to help take pressure off your plantar fascia. Single leg stand If this exercise is too easy, you can try it with your eyes closed or while standing on a pillow. 1. Without shoes, stand near a railing or in a doorway. You may hold on to  the railing or door frame as needed. 2. Stand on your left / right foot. Keep your big toe down on the floor and try to keep your arch lifted. Do not let your foot roll inward. 3. Hold this position for __________ seconds. Repeat __________ times. Complete this exercise __________ times a day. This information is not intended to replace advice given to you by your health care provider. Make sure you discuss any questions you have with your health care provider. Document Revised: 06/12/2018 Document Reviewed: 12/18/2017 Elsevier Patient Education  [place].

## 2019-09-29 NOTE — Progress Notes (Signed)
Patient presents to clinic today c/o week of pain in the left heel with some tension in his left lower calf.  Notes symptoms started after wearing a new pair of shoes for a few days.  Was initially having pain in both of his feet.  Notes switching back to his regular footwear with complete resolution of pain of the right heel.  Left heel pain has continued.  Patient notes for stepped out of the bed or after a period of prolonged rest is extremely painful.  As he continues walking the pain improved somewhat but does not resolve.  Has been trying to keep feet elevated.  Has taken some naproxen OTC which is helped somewhat.  Patient denies any swelling, bruising or other skin changes of foot or leg.  Denies any trauma or injury.  Past Medical History:  Diagnosis Date  . Allergy   . DM (diabetes mellitus) ([place])    type 2  . High cholesterol   . Hypertension   . Low testosterone     Current Outpatient Medications on File Prior to Visit  Medication Sig Dispense Refill  . acetaminophen (TYLENOL) 325 MG tablet Take 2 tablets (650 mg total) by mouth every 6 (six) hours as needed for mild pain or headache (fever >/= 101).    Marland Kitchen amLODipine-atorvastatin (CADUET) 5-10 MG tablet Take 1 tablet by mouth daily. 90 tablet 1  . aspirin EC 81 MG tablet Take 1 tablet by mouth daily.    Marland Kitchen atorvastatin (LIPITOR) 20 MG tablet Take 1 tablet (20 mg total) by mouth daily. 90 tablet 1  . Blood Glucose Monitoring Suppl (ONETOUCH VERIO FLEX SYSTEM) w/Device KIT 1 Units by Does not apply route daily. Dx:E11.9 1 kit 0  . fluticasone (FLONASE) 50 MCG/ACT nasal spray Place 2 sprays into both nostrils daily as needed for allergies or rhinitis. 16 g 6  . glucose blood test strip Use as instructed daily Dx: E11.9 100 each 3  . ibuprofen (ADVIL) 600 MG tablet Take 1 tablet (600 mg total) by mouth every 8 (eight) hours as needed. 30 tablet 0  . metFORMIN (GLUCOPHAGE) 1000 MG tablet Take 1 tablet (1,000 mg total) by mouth 2 (two)  times daily with a meal. 180 tablet 1  . multivitamin (ONE-A-DAY MEN'S) TABS tablet Take 1 tablet by mouth daily.    Glory Rosebush DELICA LANCETS 16X MISC 1 Stick by Does not apply route daily. Dx:E11.9 100 each 3  . Semaglutide,0.25 or 0.5MG/DOS, (OZEMPIC, 0.25 OR 0.5 MG/DOSE,) 2 MG/1.5ML SOPN Inject 0.5 mg into the skin once a week. 4 pen 1  . sildenafil (VIAGRA) 50 MG tablet TAKE 2 TABLETS BY MOUTH DAILY AS NEEDED FOR ERECTILE DYSFUNCTION. 4 tablet 11  . sitaGLIPtin (JANUVIA) 100 MG tablet Take 1 tablet (100 mg total) by mouth daily. 30 tablet 3  . tiZANidine (ZANAFLEX) 4 MG tablet TAKE 1 TABLET BY MOUTH EVERY 8 HOURS AS NEEDED FOR MUSCLE SPASM 15 tablet 0  . vitamin C (VITAMIN C) 500 MG tablet Take 1 tablet (500 mg total) by mouth daily. Please take for 2 weeks    . zinc sulfate 220 (50 Zn) MG capsule Take 1 capsule (220 mg total) by mouth daily. Please take for 2 weeks     No current facility-administered medications on file prior to visit.    Allergies  Allergen Reactions  . Avelox [Moxifloxacin Hcl In Nacl] Hives and Itching  . Doxycycline Rash    Family History  Problem Relation Age of  Onset  . Aneurysm Mother 33       Deceased  . Healthy Father        Living  . Prostate cancer Paternal Uncle   . Healthy Daughter        x3  . Colon cancer Neg Hx   . Esophageal cancer Neg Hx   . Rectal cancer Neg Hx   . Stomach cancer Neg Hx     Social History   Socioeconomic History  . Marital status: Single    Spouse name: Not on file  . Number of children: Not on file  . Years of education: Not on file  . Highest education level: Not on file  Occupational History  . Not on file  Tobacco Use  . Smoking status: Former Smoker    Packs/day: 0.50    Years: 15.00    Pack years: 7.50    Quit date: 04/17/2005    Years since quitting: 14.4  . Smokeless tobacco: Never Used  Vaping Use  . Vaping Use: Never used  Substance and Sexual Activity  . Alcohol use: No  . Drug use: No  .  Sexual activity: Yes  Other Topics Concern  . Not on file  Social History Narrative  . Not on file   Social Determinants of Health   Financial Resource Strain:   . Difficulty of Paying Living Expenses:   Food Insecurity:   . Worried About Charity fundraiser in the Last Year:   . Arboriculturist in the Last Year:   Transportation Needs:   . Film/video editor (Medical):   Marland Kitchen Lack of Transportation (Non-Medical):   Physical Activity:   . Days of Exercise per Week:   . Minutes of Exercise per Session:   Stress:   . Feeling of Stress :   Social Connections:   . Frequency of Communication with Friends and Family:   . Frequency of Social Gatherings with Friends and Family:   . Attends Religious Services:   . Active Member of Clubs or Organizations:   . Attends Archivist Meetings:   Marland Kitchen Marital Status:    Review of Systems - See HPI.  All other ROS are negative.  BP 120/80   Pulse 88   Temp 98.3 F (36.8 C) (Temporal)   Resp 16   Ht _0  (1.803 m)   Wt (!) 298 lb (135.2 kg)   SpO2 98%   BMI 41.56 kg/m   Physical Exam Vitals reviewed.  Constitutional:      Appearance: Normal appearance.  HENT:     Head: Normocephalic and atraumatic.  Cardiovascular:     Rate and Rhythm: Normal rate and regular rhythm.     Pulses:          Dorsalis pedis pulses are 2+ on the left side.       Posterior tibial pulses are 2+ on the left side.  Musculoskeletal:     Cervical back: Neck supple.     Left foot: Normal range of motion. No deformity or prominent metatarsal heads.  Feet:     Left foot:     Skin integrity: Skin integrity normal.     Comments: Pain with palpation over plantar fascia. No tenderness of achilles tendon. No swelling/edema or LLE.  Neurological:     General: No focal deficit present.     Mental Status: He is alert and oriented to person, place, and time.  Psychiatric:  Mood and Affect: Mood normal.     Recent Results (from the past 2160  hour(s))  Urine cytology ancillary only([place])     Status: None   Collection Time: 08/14/19 11:32 AM  Result Value Ref Range   Neisseria Gonorrhea Negative    Chlamydia Negative    Trichomonas Negative    Comment Normal Reference Range Trichomonas - Negative    Comment Normal Reference [place] Chlamydia - Negative    Comment      Normal Reference Range Neisseria Gonorrhea - Negative  HIV Antibody (routine testing w rflx)     Status: None   Collection Time: 08/14/19 11:58 AM  Result Value Ref Range   HIV 1&2 Ab, 4th Generation NON-REACTIVE NON-REACTI    Comment: HIV-1 antigen and HIV-1/HIV-2 antibodies were not detected. There is no laboratory evidence of HIV infection. Marland Kitchen PLEASE NOTE: This information has been disclosed to you from records whose confidentiality may be protected by state law.  If your state requires such protection, then the state law prohibits you from making any further disclosure of the information without the specific written consent of the person to whom it pertains, or as otherwise permitted by law. A general authorization for the release of medical or other information is NOT sufficient for this purpose. . For additional information please refer to http://education.questdiagnostics.com/faq/FAQ106 (This link is being provided for informational/ educational purposes only.) . Marland Kitchen The performance of this assay has not been clinically validated in patients less than 20 years old. .   RPR     Status: None   Collection Time: 08/14/19 11:58 AM  Result Value Ref Range   RPR Ser Ql NON-REACTIVE NON-REACTI  HSV(herpes smplx)abs-1+2(IgG+IgM)-bld     Status: Abnormal   Collection Time: 08/14/19 11:58 AM  Result Value Ref Range   HSVI/II Comb IgM <0.91 0.00 - 0.90 Ratio    Comment:                                  Negative        <0.91                                  Equivocal 0.91 - 1.09                                  Positive        >1.09    HSV 1  Glycoprotein G Ab, IgG 48.10 (H) 0.00 - 0.90 index    Comment:                                  Negative        <0.91                                  Equivocal 0.91 - 1.09                                  Positive        >1.09  Note: Negative indicates no antibodies detected to  HSV-1. Equivocal may suggest early infection.  If  clinically appropriate, retest at later date. 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    HSV 2 IgG, Type Spec 6.79 (H) 0.00 - 0.90 index    Comment:                                  Negative        <0.91                                  Equivocal 0.91 - 1.09                                  Positive        >1.09  Note: Negative indicates no antibodies detected to  HSV-2. Equivocal may suggest early infection.  If  clinically appropriate, retest at later date. 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   Comp Met (CMET)     Status: Abnormal   Collection Time: 08/14/19 11:58 AM  Result Value Ref Range   Sodium 138 135 - 145 mEq/L   Potassium 4.3 3.5 - 5.1 mEq/L   Chloride 102 96 - 112 mEq/L   CO2 31 19 - 32 mEq/L   Glucose, Bld 140 (H) 70 - 99 mg/dL   BUN 8 6 - 23 mg/dL   Creatinine, Ser 0.92 0.40 - 1.50 mg/dL   Total Bilirubin 0.7 0.2 - 1.2 mg/dL   Alkaline Phosphatase 89 39 - 117 U/L   AST 17 0 - 37 U/L   ALT 25 0 - 53 U/L   Total Protein 7.5 6.0 - 8.3 g/dL   Albumin 4.7 3.5 - 5.2 g/dL   GFR 103.80 >60.00 mL/min   Calcium 9.9 8.4 - 10.5 mg/dL  Hemoglobin A1c     Status: None   Collection Time: 08/14/19 11:58 AM  Result Value Ref Range   Hgb A1c MFr Bld 6.4 4.6 - 6.5 %    Comment: Glycemic Control Guidelines for People with Diabetes:Non Diabetic:  <6%Goal of Therapy: <7%Additional Action Suggested:  >8%    Assessment/Plan: 1. Plantar fasciitis, left Mild.  Atraumatic.  Supportive measures and OTC medications reviewed.  Recommend stretching.  Handout on stretches given.  Rx naproxen 500 mg to take twice daily.  Patient encouraged to  wear shoes with good arch support.  If symptoms are not improving will have him see podiatry.  This visit occurred during the SARS-CoV-2 public health emergency.  Safety protocols were in place, including screening questions prior to the visit, additional usage of staff PPE, and extensive cleaning of exam room while observing appropriate contact time as indicated for disinfecting solutions.     Leeanne Rio, PA-C

## 2019-10-02 ENCOUNTER — Ambulatory Visit: Payer: Commercial Managed Care - PPO | Admitting: Physician Assistant

## 2019-10-12 ENCOUNTER — Telehealth: Payer: Self-pay | Admitting: Physician Assistant

## 2019-10-12 DIAGNOSIS — E785 Hyperlipidemia, unspecified: Secondary | ICD-10-CM

## 2019-10-12 DIAGNOSIS — E119 Type 2 diabetes mellitus without complications: Secondary | ICD-10-CM

## 2019-10-12 MED ORDER — ATORVASTATIN CALCIUM 20 MG PO TABS: 20.0000 mg | ORAL_TABLET | Freq: Every day | ORAL | 1 refills | Status: DC

## 2019-10-12 MED ORDER — METFORMIN HCL 1000 MG PO TABS: 1000.0000 mg | ORAL_TABLET | Freq: Two times a day (BID) | ORAL | 1 refills | Status: DC

## 2019-10-12 NOTE — Telephone Encounter (Signed)
Rx sent to the preferred patient pharmacy

## 2019-10-12 NOTE — Telephone Encounter (Signed)
Pt needs a refill on the atorvastatin and the metformin. Pt uses [facility] on [address].

## 2019-10-20 ENCOUNTER — Telehealth: Payer: Self-pay | Admitting: Physician Assistant

## 2019-10-20 DIAGNOSIS — H6982 Other specified disorders of Eustachian tube, left ear: Secondary | ICD-10-CM | POA: Insufficient documentation

## 2019-10-20 DIAGNOSIS — H6992 Unspecified Eustachian tube disorder, left ear: Secondary | ICD-10-CM | POA: Insufficient documentation

## 2019-10-20 NOTE — Telephone Encounter (Signed)
Told him if not improving would need evaluation and management per podiatry. They can fit him for boot if needed.

## 2019-10-20 NOTE — Telephone Encounter (Signed)
Pt called in stating that the plantar fascitis isn't getting that much better. He states that he and [name] talked about him wearing a boot after work and on his days off. He wanted to know what is the process for obtaining that. Please advise and pt can be reached at the home #

## 2019-10-21 NOTE — Telephone Encounter (Signed)
Called patient he will call podiatry and make appointment. Has been seen no referral needed.

## 2019-11-03 ENCOUNTER — Ambulatory Visit (INDEPENDENT_AMBULATORY_CARE_PROVIDER_SITE_OTHER): Payer: Commercial Managed Care - PPO

## 2019-11-03 ENCOUNTER — Encounter: Payer: Self-pay | Admitting: Podiatrist

## 2019-11-03 ENCOUNTER — Ambulatory Visit (INDEPENDENT_AMBULATORY_CARE_PROVIDER_SITE_OTHER): Payer: Commercial Managed Care - PPO | Admitting: Podiatrist

## 2019-11-03 ENCOUNTER — Other Ambulatory Visit: Payer: Self-pay

## 2019-11-03 DIAGNOSIS — M722 Plantar fascial fibromatosis: Secondary | ICD-10-CM

## 2019-11-03 NOTE — Patient Instructions (Signed)

## 2019-11-04 ENCOUNTER — Ambulatory Visit: Payer: Commercial Managed Care - PPO | Admitting: Podiatry

## 2019-11-06 NOTE — Progress Notes (Addendum)
Subjective: Edwin [name] is a 54 y.o. male patient presents to office with complaint of moderate heel pain on the left heel. Patient admits to post static dyskinesia for 1  month in duration. Patient has treated this problem with ibuproen with no relief. Denies any other pedal complaints.   Patient Active Problem List   Diagnosis Date Noted  . Seborrheic keratosis 04/15/2017  . PVC's (premature ventricular contractions) 09/03/2014  . Essential hypertension, benign 11/03/2013  . Diabetes mellitus type II, controlled ([place]) 08/03/2013  . Hyperlipidemia 08/03/2013  . Low serum testosterone level 08/03/2013  . Prostate cancer screening 08/03/2013  . Visit for preventive health examination 08/03/2013    Current Outpatient Medications on File Prior to Visit  Medication Sig Dispense Refill  . acetaminophen (TYLENOL) 325 MG tablet Take 2 tablets (650 mg total) by mouth every 6 (six) hours as needed for mild pain or headache (fever >/= 101).    Marland Kitchen amLODipine-atorvastatin (CADUET) 5-10 MG tablet Take 1 tablet by mouth daily. 90 tablet 1  . aspirin EC 81 MG tablet Take 1 tablet by mouth daily.    Marland Kitchen atorvastatin (LIPITOR) 20 MG tablet Take 1 tablet (20 mg total) by mouth daily. 90 tablet 1  . Blood Glucose Monitoring Suppl (ONETOUCH VERIO FLEX SYSTEM) w/Device KIT 1 Units by Does not apply route daily. Dx:E11.9 1 kit 0  . fluticasone (FLONASE) 50 MCG/ACT nasal spray Place 2 sprays into both nostrils daily as needed for allergies or rhinitis. 16 g 6  . glucose blood test strip Use as instructed daily Dx: E11.9 100 each 3  . ibuprofen (ADVIL) 600 MG tablet Take 1 tablet (600 mg total) by mouth every 8 (eight) hours as needed. 30 tablet 0  . metFORMIN (GLUCOPHAGE) 1000 MG tablet Take 1 tablet (1,000 mg total) by mouth 2 (two) times daily with a meal. 180 tablet 1  . multivitamin (ONE-A-DAY MEN'S) TABS tablet Take 1 tablet by mouth daily.    . naproxen (NAPROSYN) 500 MG tablet Take 1 tablet (500 mg  total) by mouth 2 (two) times daily with a meal. 30 tablet 1  . ONETOUCH DELICA LANCETS 22G MISC 1 Stick by Does not apply route daily. Dx:E11.9 100 each 3  . Semaglutide,0.25 or 0.5MG/DOS, (OZEMPIC, 0.25 OR 0.5 MG/DOSE,) 2 MG/1.5ML SOPN Inject 0.5 mg into the skin once a week. 4 pen 1  . sildenafil (VIAGRA) 50 MG tablet TAKE 2 TABLETS BY MOUTH DAILY AS NEEDED FOR ERECTILE DYSFUNCTION. 4 tablet 11  . sitaGLIPtin (JANUVIA) 100 MG tablet Take 1 tablet (100 mg total) by mouth daily. 30 tablet 3  . tiZANidine (ZANAFLEX) 4 MG tablet TAKE 1 TABLET BY MOUTH EVERY 8 HOURS AS NEEDED FOR MUSCLE SPASM 15 tablet 0  . triamcinolone cream (KENALOG) 0.1 % Apply 1 application topically 2 (two) times daily. 30 g 0  . vitamin C (VITAMIN C) 500 MG tablet Take 1 tablet (500 mg total) by mouth daily. Please take for 2 weeks    . zinc sulfate 220 (50 Zn) MG capsule Take 1 capsule (220 mg total) by mouth daily. Please take for 2 weeks     No current facility-administered medications on file prior to visit.    Allergies  Allergen Reactions  . Avelox [Moxifloxacin Hcl In Nacl] Hives and Itching  . Doxycycline Rash    Objective: Physical Exam General: The patient is alert and oriented x3 in no acute distress.  Dermatology: Skin is warm, dry and supple bilateral lower extremities. Nails 1-10  are normal. There is no erythema, edema, no eccymosis, no open lesions present. Integument is otherwise unremarkable.  Vascular: Dorsalis Pedis pulse and Posterior Tibial pulse are 2/4 bilateral. Capillary fill time is immediate to all digits.  Neurological: Grossly intact to light touch with an achilles reflex of +2/5 and a  negative Tinel's sign bilateral.  Musculoskeletal: Tenderness to palpation at the medial calcaneal tubercale and through the insertion of the plantar fascia on the left foot. No pain with compression of calcaneus bilateral. No pain with tuning fork to calcaneus bilateral. No pain with calf compression  bilateral. There is decreased Ankle joint range of motion bilateral. All other joints range of motion within normal limits bilateral. Strength 5/5 in all groups bilateral.    Xray,Left foot:  Normal osseous mineralization. Joint spaces preserved. No fracture/dislocation/boney destruction. No calcaneal spur noted.  Shorter first metatarsal in comparison to the second noted. Mild increase in first IM angle with slight lateral deviation of sesamoids noted.  Flattening of the arch seen on the lateral view. No other soft tissue abnormalities or radiopaque foreign bodies.   Assessment and Plan: Problem List Items Addressed This Visit    None    Visit Diagnoses    Plantar fascial fibromatosis    -  Primary   Plantar fasciitis of left foot       Relevant Orders   DG Foot Complete Left (Completed)      -Complete examination performed.  -Xrays reviewed -Discussed with patient in detail the condition of plantar fasciitis, how this occurs and general treatment options. Explained both conservative and surgical treatments.  -After oral consent and aseptic prep, injected a mixture containing 1 ml of 2%  plain lidocaine, 1 ml 0.5% plain marcaine, 1 ml of kenalog 10 into left heel. Post-injection care discussed with patient.  -Recommended good supportive shoes. - Explained in detail the use of the fascial brace/ night splint which was dispensed at today's visit. -Explained and dispensed to patient daily stretching exercises. -Recommend patient to ice affected area 1-2x daily. -- patient to try these conservative treatments for 3-4 weeks and if no improvement, he will call to be seen back.

## 2019-11-13 ENCOUNTER — Telehealth: Payer: Self-pay | Admitting: Physician Assistant

## 2019-11-13 NOTE — Telephone Encounter (Signed)
FMLA form left in Edwin [name]'s bin up front.  Patient would like to be called when this form is complete

## 2019-11-16 DIAGNOSIS — Z0279 Encounter for issue of other medical certificate: Secondary | ICD-10-CM

## 2019-11-17 NOTE — Telephone Encounter (Signed)
Forms completed and signed. Charge sheet filled out and given to front desk staff. Ready for patient pickup.

## 2019-11-17 NOTE — Telephone Encounter (Signed)
Called and left patient message that form is ready

## 2019-11-25 LAB — HM DIABETES EYE EXAM

## 2019-12-07 ENCOUNTER — Encounter: Payer: Self-pay | Admitting: Podiatry

## 2019-12-09 ENCOUNTER — Telehealth: Payer: Self-pay | Admitting: Physician Assistant

## 2019-12-09 DIAGNOSIS — E785 Hyperlipidemia, unspecified: Secondary | ICD-10-CM

## 2019-12-09 DIAGNOSIS — I10 Essential (primary) hypertension: Secondary | ICD-10-CM

## 2019-12-09 NOTE — Telephone Encounter (Signed)
Patient called and would like for you to order his Januvia and Steele - Patient states that you order these from the company

## 2019-12-10 MED ORDER — AMLODIPINE-ATORVASTATIN 5-10 MG PO TABS: 1.0000 | ORAL_TABLET | Freq: Every day | ORAL | 0 refills | Status: DC

## 2019-12-10 NOTE — Telephone Encounter (Signed)
[facility] patient assistance for renewal of [name] 5/10 mg Ordered today 12/10/19 will take 7-10 days to ship to office. This is the last refill before application expires. [name] will be under [employer] starting 03/05/2020.  #[id number]  Called [employer] patient assistance on 12/10/2019 but is 3 days too early for medication refill. Will call back on 12/14/2019 for refill of Januvia

## 2019-12-14 ENCOUNTER — Other Ambulatory Visit: Payer: Self-pay | Admitting: Emergency Medicine

## 2019-12-14 DIAGNOSIS — E119 Type 2 diabetes mellitus without complications: Secondary | ICD-10-CM

## 2019-12-14 MED ORDER — SITAGLIPTIN PHOSPHATE 100 MG PO TABS: 100.0000 mg | ORAL_TABLET | Freq: Every day | ORAL | 1 refills | Status: DC

## 2019-12-14 NOTE — Telephone Encounter (Signed)
Called [employer] patient assistance for refilling of Januvia medication. Patient medication is not due for 13 days. Will go ahead and request refill of medication but they will not process until due date and then will take 7-10 days to ship. 1 RF left.

## 2019-12-21 ENCOUNTER — Telehealth: Payer: Self-pay | Admitting: Emergency Medicine

## 2019-12-21 IMAGING — DX PORTABLE CHEST - 1 VIEW
1 series · 1 of 1 positions shown · non-contrast
Comparison: None.

CLINICAL DATA: Fever, shortness of breath

EXAM:
PORTABLE CHEST 1 VIEW

[chest ap]
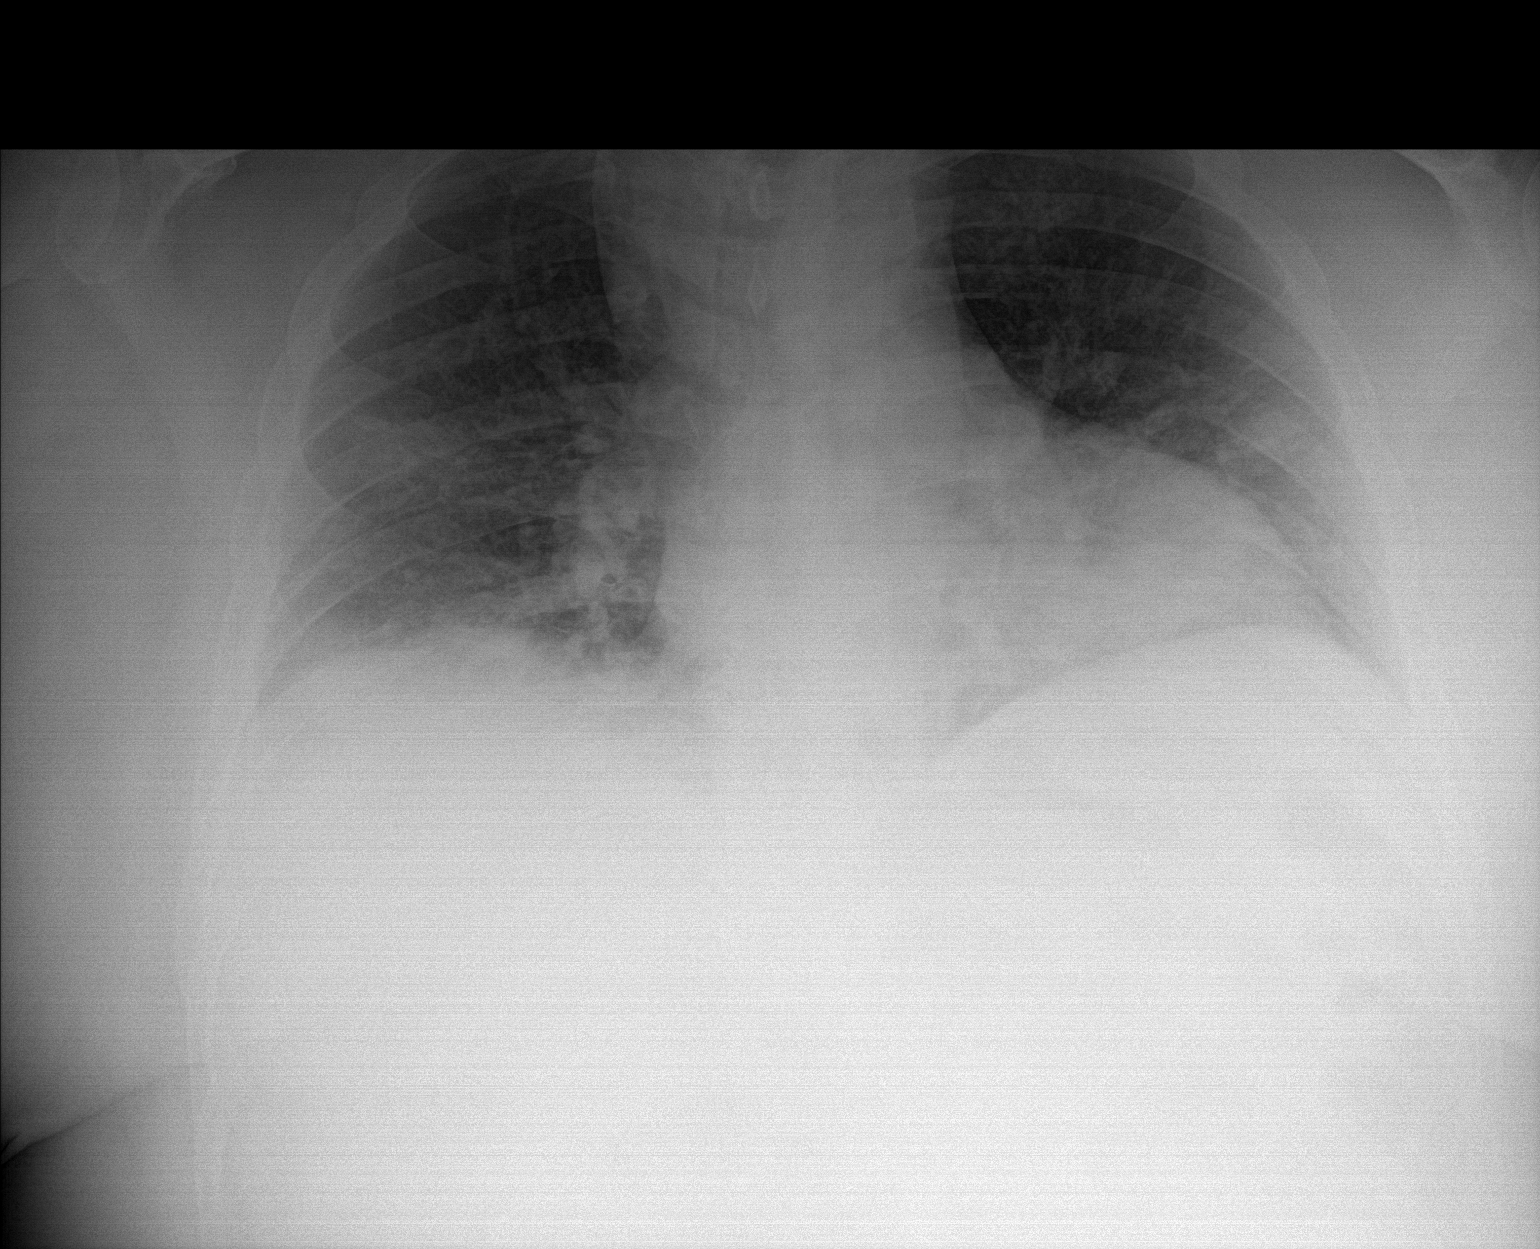

[1 of 1 positions shown; findings below may reference images not displayed]

FINDINGS: Low lung volumes. Patchy airspace opacities in the mid and lower
lungs. Heart is borderline in size. No effusions or acute bony
abnormality.
IMPRESSION: Low lung volumes. Patchy bilateral mid and lower lung airspace
opacities could reflect edema or infection. Atypical/viral infection
not excluded.

## 2019-12-21 NOTE — Telephone Encounter (Signed)
Left detailed message on VM that patient samples are ready at the front desk for pick up

## 2019-12-22 ENCOUNTER — Encounter: Payer: Self-pay | Admitting: Podiatry

## 2019-12-22 ENCOUNTER — Other Ambulatory Visit: Payer: Self-pay

## 2019-12-22 ENCOUNTER — Ambulatory Visit (INDEPENDENT_AMBULATORY_CARE_PROVIDER_SITE_OTHER): Payer: Commercial Managed Care - PPO | Admitting: Podiatry

## 2019-12-22 DIAGNOSIS — M722 Plantar fascial fibromatosis: Secondary | ICD-10-CM | POA: Diagnosis not present

## 2019-12-23 NOTE — Progress Notes (Signed)
  Subjective:  Patient ID: Edwin [name], male    DOB: 06-18-1965,  MRN: [id number]  Chief Complaint  Patient presents with  . Plantar Fasciitis    follow-up, still painful. interested in injection     54 y.o. male presents with the above complaint. History confirmed with patient.  Has been doing a stretching exercises.  He saw Dr. Alba Destine on 11/03/2019 and had a plantar fascial injection.  This helped a little bit but has gone away.  The brace is he was unable to wear because the are too small and the Velcro will not hold.  Objective:  Physical Exam: warm, good capillary refill, no trophic changes or ulcerative lesions and normal DP and PT pulses. Left Foot: Pain on palpation to the plantar fascial insertion on the medial calcaneus plantarly Assessment:   1. Plantar fascial fibromatosis   2. Plantar fasciitis of left foot      Plan:  Patient was evaluated and treated and all questions answered.   -Continue stretching and icing at home.  I reviewed this with him. -A new injection was performed as per below -I gave him new better fitting large/XL plantar fascial braces  After sterile prep with povidone-iodine solution and alcohol, the left heel was injected with 0.5cc 2% xylocaine plain, 0.5cc 0.5% marcaine plain, 5mg  triamcinolone acetonide, and 2mg  dexamethasone was injected along the plantar fascia at the insertion on the plantar calcaneus. The patient tolerated the procedure well without complication.   Return in about 1 month (around 01/22/2020).

## 2020-01-19 ENCOUNTER — Ambulatory Visit: Payer: Commercial Managed Care - PPO | Admitting: Podiatry

## 2020-02-11 ENCOUNTER — Encounter: Payer: Self-pay | Admitting: Physician Assistant

## 2020-02-11 ENCOUNTER — Ambulatory Visit (INDEPENDENT_AMBULATORY_CARE_PROVIDER_SITE_OTHER): Payer: Commercial Managed Care - PPO | Admitting: Physician Assistant

## 2020-02-11 ENCOUNTER — Other Ambulatory Visit: Payer: Self-pay

## 2020-02-11 VITALS — BP 138/80 | HR 72 | Temp 98.0°F | Resp 20 | Ht 71.0 in | Wt 296.8 lb

## 2020-02-11 DIAGNOSIS — Z125 Encounter for screening for malignant neoplasm of prostate: Secondary | ICD-10-CM

## 2020-02-11 DIAGNOSIS — Z0000 Encounter for general adult medical examination without abnormal findings: Secondary | ICD-10-CM | POA: Diagnosis not present

## 2020-02-11 DIAGNOSIS — I10 Essential (primary) hypertension: Secondary | ICD-10-CM | POA: Diagnosis not present

## 2020-02-11 DIAGNOSIS — E119 Type 2 diabetes mellitus without complications: Secondary | ICD-10-CM

## 2020-02-11 DIAGNOSIS — E785 Hyperlipidemia, unspecified: Secondary | ICD-10-CM | POA: Diagnosis not present

## 2020-02-11 LAB — URINALYSIS, ROUTINE W REFLEX MICROSCOPIC
Bilirubin Urine: NEGATIVE
Hgb urine dipstick: NEGATIVE
Ketones, ur: NEGATIVE
Leukocytes,Ua: NEGATIVE
Nitrite: NEGATIVE
RBC / HPF: NONE SEEN (ref 0–?)
Specific Gravity, Urine: 1.02 (ref 1.000–1.030)
Total Protein, Urine: NEGATIVE
Urine Glucose: NEGATIVE
Urobilinogen, UA: 0.2 (ref 0.0–1.0)
pH: 5.5 (ref 5.0–8.0)

## 2020-02-11 LAB — LIPID PANEL
Cholesterol: 171 mg/dL (ref 0–200)
HDL: 39.9 mg/dL (ref >39.00)
LDL Cholesterol: 104 mg/dL — ABNORMAL HIGH (ref 0–99)
NonHDL: 130.73
Total CHOL/HDL Ratio: 4
Triglycerides: 133 mg/dL (ref 0.0–149.0)
VLDL: 26.6 mg/dL (ref 0.0–40.0)

## 2020-02-11 LAB — COMPREHENSIVE METABOLIC PANEL
ALT: 20 U/L (ref 0–53)
AST: 14 U/L (ref 0–37)
Albumin: 4.5 g/dL (ref 3.5–5.2)
Alkaline Phosphatase: 90 U/L (ref 39–117)
BUN: 11 mg/dL (ref 6–23)
CO2: 32 mEq/L (ref 19–32)
Calcium: 9.7 mg/dL (ref 8.4–10.5)
Chloride: 101 mEq/L (ref 96–112)
Creatinine, Ser: 1.04 mg/dL (ref 0.40–1.50)
GFR: 81.5 mL/min (ref >60.00)
Glucose, Bld: 193 mg/dL — ABNORMAL HIGH (ref 70–99)
Potassium: 4.5 mEq/L (ref 3.5–5.1)
Sodium: 139 mEq/L (ref 135–145)
Total Bilirubin: 0.7 mg/dL (ref 0.2–1.2)
Total Protein: 7.5 g/dL (ref 6.0–8.3)

## 2020-02-11 LAB — CBC WITH DIFFERENTIAL/PLATELET
Basophils Absolute: 0.1 10*3/uL (ref 0.0–0.1)
Basophils Relative: 0.9 % (ref 0.0–3.0)
Eosinophils Absolute: 0.1 10*3/uL (ref 0.0–0.7)
Eosinophils Relative: 1 % (ref 0.0–5.0)
HCT: 45.3 % (ref 39.0–52.0)
Hemoglobin: 14.7 g/dL (ref 13.0–17.0)
Lymphocytes Relative: 27.7 % (ref 12.0–46.0)
Lymphs Abs: 1.9 10*3/uL (ref 0.7–4.0)
MCHC: 32.5 g/dL (ref 30.0–36.0)
MCV: 81.6 fl (ref 78.0–100.0)
Monocytes Absolute: 0.6 10*3/uL (ref 0.1–1.0)
Monocytes Relative: 8.1 % (ref 3.0–12.0)
Neutro Abs: 4.3 10*3/uL (ref 1.4–7.7)
Neutrophils Relative %: 62.3 % (ref 43.0–77.0)
Platelets: 297 10*3/uL (ref 150.0–400.0)
RBC: 5.55 Mil/uL (ref 4.22–5.81)
RDW: 13.4 % (ref 11.5–15.5)
WBC: 6.9 10*3/uL (ref 4.0–10.5)

## 2020-02-11 LAB — MICROALBUMIN / CREATININE URINE RATIO
Creatinine,U: 176.1 mg/dL
Microalb Creat Ratio: 1 mg/g (ref 0.0–30.0)
Microalb, Ur: 1.7 mg/dL (ref 0.0–1.9)

## 2020-02-11 LAB — PSA: PSA: 0.61 ng/mL (ref 0.10–4.00)

## 2020-02-11 LAB — HEMOGLOBIN A1C: Hgb A1c MFr Bld: 8.9 % — ABNORMAL HIGH (ref 4.6–6.5)

## 2020-02-11 MED ORDER — IBUPROFEN 600 MG PO TABS: 600.0000 mg | ORAL_TABLET | Freq: Three times a day (TID) | ORAL | 0 refills | Status: DC | PRN

## 2020-02-11 NOTE — Progress Notes (Signed)
Patient presents to clinic today for annual exam.  Patient is fasting for labs.  Health Maintenance: Immunizations -- declines flu and pneumonia vaccine.  Colon Cancer Screening -- up-to-date  Past Medical History:  Diagnosis Date  . Allergy   . DM (diabetes mellitus) ([place])    type 2  . High cholesterol   . Hypertension   . Low testosterone     Past Surgical History:  Procedure Laterality Date  . COLONOSCOPY  10 years ago   at [facility] "normal" exam  . NASAL SINUS SURGERY  2010  . UPPER GASTROINTESTINAL ENDOSCOPY  10 years ago  . WISDOM TOOTH EXTRACTION      Current Outpatient Medications on File Prior to Visit  Medication Sig Dispense Refill  . acetaminophen (TYLENOL) 325 MG tablet Take 2 tablets (650 mg total) by mouth every 6 (six) hours as needed for mild pain or headache (fever >/= 101).    Marland Kitchen amLODipine-atorvastatin (CADUET) 5-10 MG tablet Take 1 tablet by mouth daily. 90 tablet 0  . aspirin EC 81 MG tablet Take 1 tablet by mouth daily.    Marland Kitchen atorvastatin (LIPITOR) 20 MG tablet Take 1 tablet (20 mg total) by mouth daily. 90 tablet 1  . Blood Glucose Monitoring Suppl (ONETOUCH VERIO FLEX SYSTEM) w/Device KIT 1 Units by Does not apply route daily. Dx:E11.9 1 kit 0  . fluticasone (FLONASE) 50 MCG/ACT nasal spray Place 2 sprays into both nostrils daily as needed for allergies or rhinitis. 16 g 6  . glucose blood test strip Use as instructed daily Dx: E11.9 100 each 3  . ibuprofen (ADVIL) 600 MG tablet Take 1 tablet (600 mg total) by mouth every 8 (eight) hours as needed. 30 tablet 0  . metFORMIN (GLUCOPHAGE) 1000 MG tablet Take 1 tablet (1,000 mg total) by mouth 2 (two) times daily with a meal. 180 tablet 1  . multivitamin (ONE-A-DAY MEN'S) TABS tablet Take 1 tablet by mouth daily.    . naproxen (NAPROSYN) 500 MG tablet Take 1 tablet (500 mg total) by mouth 2 (two) times daily with a meal. 30 tablet 1  . ONETOUCH DELICA LANCETS 37S MISC 1 Stick by Does not apply route  daily. Dx:E11.9 100 each 3  . Semaglutide,0.25 or 0.5MG/DOS, (OZEMPIC, 0.25 OR 0.5 MG/DOSE,) 2 MG/1.5ML SOPN Inject 0.5 mg into the skin once a week. 4 pen 1  . sildenafil (VIAGRA) 50 MG tablet TAKE 2 TABLETS BY MOUTH DAILY AS NEEDED FOR ERECTILE DYSFUNCTION. 4 tablet 11  . sitaGLIPtin (JANUVIA) 100 MG tablet Take 1 tablet (100 mg total) by mouth daily. 90 tablet 1  . tiZANidine (ZANAFLEX) 4 MG tablet TAKE 1 TABLET BY MOUTH EVERY 8 HOURS AS NEEDED FOR MUSCLE SPASM 15 tablet 0  . triamcinolone cream (KENALOG) 0.1 % Apply 1 application topically 2 (two) times daily. 30 g 0  . vitamin C (VITAMIN C) 500 MG tablet Take 1 tablet (500 mg total) by mouth daily. Please take for 2 weeks    . zinc sulfate 220 (50 Zn) MG capsule Take 1 capsule (220 mg total) by mouth daily. Please take for 2 weeks     No current facility-administered medications on file prior to visit.    Allergies  Allergen Reactions  . Avelox [Moxifloxacin Hcl In Nacl] Hives and Itching  . Doxycycline Rash    Family History  Problem Relation Age of Onset  . Aneurysm Mother 39       Deceased  . Healthy Father  Living  . Prostate cancer Paternal Uncle   . Healthy Daughter        x3  . Colon cancer Neg Hx   . Esophageal cancer Neg Hx   . Rectal cancer Neg Hx   . Stomach cancer Neg Hx     Social History   Socioeconomic History  . Marital status: Single    Spouse name: Not on file  . Number of children: Not on file  . Years of education: Not on file  . Highest education level: Not on file  Occupational History  . Not on file  Tobacco Use  . Smoking status: Former Smoker    Packs/day: 0.50    Years: 15.00    Pack years: 7.50    Quit date: 04/17/2005    Years since quitting: 14.8  . Smokeless tobacco: Never Used  Vaping Use  . Vaping Use: Never used  Substance and Sexual Activity  . Alcohol use: No  . Drug use: No  . Sexual activity: Yes  Other Topics Concern  . Not on file  Social History Narrative  .  Not on file   Social Determinants of Health   Financial Resource Strain: Not on file  Food Insecurity: Not on file  Transportation Needs: Not on file  Physical Activity: Not on file  Stress: Not on file  Social Connections: Not on file  Intimate Partner Violence: Not on file   Review of Systems  Constitutional: Negative for fever and weight loss.  HENT: Negative for ear discharge, ear pain, hearing loss and tinnitus.   Eyes: Negative for blurred vision, double vision, photophobia and pain.  Respiratory: Negative for cough and shortness of breath.   Cardiovascular: Negative for chest pain and palpitations.  Gastrointestinal: Negative for abdominal pain, blood in stool, constipation, diarrhea, heartburn, melena, nausea and vomiting.  Genitourinary: Negative for dysuria, flank pain, frequency, hematuria and urgency.  Musculoskeletal: Negative for falls.  Neurological: Negative for dizziness, loss of consciousness and headaches.  Endo/Heme/Allergies: Negative for environmental allergies.  Psychiatric/Behavioral: Negative for depression, hallucinations, substance abuse and suicidal ideas. The patient is not nervous/anxious and does not have insomnia.    There were no vitals taken for this visit.  Physical Exam Vitals reviewed.  Constitutional:      General: He is not in acute distress.    Appearance: He is well-developed and well-nourished. He is not diaphoretic.  HENT:     Head: Normocephalic and atraumatic.     Right Ear: Tympanic membrane, ear canal and external ear normal.     Left Ear: Tympanic membrane, ear canal and external ear normal.     Nose: Nose normal.     Mouth/Throat:     Mouth: Oropharynx is clear and moist and mucous membranes are normal.     Pharynx: No posterior oropharyngeal edema or posterior oropharyngeal erythema.  Eyes:     Conjunctiva/sclera: Conjunctivae normal.     Pupils: Pupils are equal, round, and reactive to light.  Neck:     Thyroid: No  thyromegaly.  Cardiovascular:     Rate and Rhythm: Normal rate and regular rhythm.     Pulses: Intact distal pulses.     Heart sounds: Normal heart sounds.  Pulmonary:     Effort: Pulmonary effort is normal. No respiratory distress.     Breath sounds: Normal breath sounds. No wheezing or rales.  Chest:     Chest wall: No tenderness.  Abdominal:     General: Bowel sounds are normal.  There is no distension.     Palpations: Abdomen is soft. There is no mass.     Tenderness: There is no abdominal tenderness. There is no guarding or rebound.  Musculoskeletal:     Cervical back: Neck supple.  Lymphadenopathy:     Cervical: No cervical adenopathy.  Skin:    General: Skin is warm and dry.     Findings: No rash.  Neurological:     Mental Status: He is alert and oriented to person, place, and time.     Cranial Nerves: No cranial nerve deficit.  Psychiatric:        Mood and Affect: Mood and affect normal.    Recent Results (from the past 2160 hour(s))  HM DIABETES EYE EXAM     Status: None   Collection Time: 11/25/19 12:22 PM  Result Value Ref Range   HM Diabetic Eye Exam No Retinopathy No Retinopathy    Assessment/Plan: 1. Visit for preventive health examination Depression screen negative. Health Maintenance reviewed. Preventive schedule discussed and handout given in AVS. Will obtain fasting labs today.  - CBC with Differential/Platelet  2. Essential hypertension, benign BP stable. Continue current medication regimen. Repeat CMP today.  - Comprehensive metabolic panel  3. Controlled type 2 diabetes mellitus without complication, without long-term current use of insulin (HCC) Taking medications as directed. Up-to-date on eye exam and foot exam. Due for nephropathy screen - order placed today. Declines influenza and pneumonia vaccines. Repeat fasting labs today. Will adjust regimen according to glycemic control - Comprehensive metabolic panel - Hemoglobin A1c - Lipid panel -  Urine Microalbumin w/creat. ratio - Urinalysis, Routine w reflex microscopic  4. Prostate cancer screening He indicates understanding of the limitations of this screening test and wishes to proceed with screening PSA testing.  - PSA  5. Hyperlipidemia, unspecified hyperlipidemia type Taking medications as directed. Dietary and exercise recommendations reviewed. Repeat fasting labs today. - Comprehensive metabolic panel - Lipid panel    This visit occurred during the SARS-CoV-2 public health emergency.  Safety protocols were in place, including screening questions prior to the visit, additional usage of staff PPE, and extensive cleaning of exam room while observing appropriate contact time as indicated for disinfecting solutions.    Leeanne Rio, PA-C

## 2020-02-11 NOTE — Patient Instructions (Signed)
Please go to the lab for blood work.   Our office will call you with your results unless you have chosen to receive results via MyChart.  If your blood work is normal we will follow-up each year for physicals and as scheduled for chronic medical problems.  If anything is abnormal we will treat accordingly and get you in for a follow-up.   Preventive Care 40-54 Years Old, Male Preventive care refers to lifestyle choices and visits with your health care provider that can promote health and wellness. This includes:  A yearly physical exam. This is also called an annual well check.  Regular dental and eye exams.  Immunizations.  Screening for certain conditions.  Healthy lifestyle choices, such as eating a healthy diet, getting regular exercise, not using drugs or products that contain nicotine and tobacco, and limiting alcohol use. What can I expect for my preventive care visit? Physical exam Your health care provider will check:  Height and weight. These may be used to calculate body mass index (BMI), which is a measurement that tells if you are at a healthy weight.  Heart rate and blood pressure.  Your skin for abnormal spots. Counseling Your health care provider may ask you questions about:  Alcohol, tobacco, and drug use.  Emotional well-being.  Home and relationship well-being.  Sexual activity.  Eating habits.  Work and work environment. What immunizations do I need?  Influenza (flu) vaccine  This is recommended every year. Tetanus, diphtheria, and pertussis (Tdap) vaccine  You may need a Td booster every 10 years. Varicella (chickenpox) vaccine  You may need this vaccine if you have not already been vaccinated. Zoster (shingles) vaccine  You may need this after age 60. Measles, mumps, and rubella (MMR) vaccine  You may need at least one dose of MMR if you were born in 1957 or later. You may also need a second dose. Pneumococcal conjugate (PCV13)  vaccine  You may need this if you have certain conditions and were not previously vaccinated. Pneumococcal polysaccharide (PPSV23) vaccine  You may need one or two doses if you smoke cigarettes or if you have certain conditions. Meningococcal conjugate (MenACWY) vaccine  You may need this if you have certain conditions. Hepatitis A vaccine  You may need this if you have certain conditions or if you travel or work in places where you may be exposed to hepatitis A. Hepatitis B vaccine  You may need this if you have certain conditions or if you travel or work in places where you may be exposed to hepatitis B. Haemophilus influenzae type b (Hib) vaccine  You may need this if you have certain risk factors. Human papillomavirus (HPV) vaccine  If recommended by your health care provider, you may need three doses over 6 months. You may receive vaccines as individual doses or as more than one vaccine together in one shot (combination vaccines). Talk with your health care provider about the risks and benefits of combination vaccines. What tests do I need? Blood tests  Lipid and cholesterol levels. These may be checked every 5 years, or more frequently if you are over 50 years old.  Hepatitis C test.  Hepatitis B test. Screening  Lung cancer screening. You may have this screening every year starting at age 55 if you have a 30-pack-year history of smoking and currently smoke or have quit within the past 15 years.  Prostate cancer screening. Recommendations will vary depending on your family history and other risks.  Colorectal cancer   screening. All adults should have this screening starting at age 50 and continuing until age 75. Your health care provider may recommend screening at age 45 if you are at increased risk. You will have tests every 1-10 years, depending on your results and the type of screening test.  Diabetes screening. This is done by checking your blood sugar (glucose) after  you have not eaten for a while (fasting). You may have this done every 1-3 years.  Sexually transmitted disease (STD) testing. Follow these instructions at home: Eating and drinking  Eat a diet that includes fresh fruits and vegetables, whole grains, lean protein, and low-fat dairy products.  Take vitamin and mineral supplements as recommended by your health care provider.  Do not drink alcohol if your health care provider tells you not to drink.  If you drink alcohol: ? Limit how much you have to 0-2 drinks a day. ? Be aware of how much alcohol is in your drink. In the [place] one drink equals one 12 oz bottle of beer (355 mL), one 5 oz glass of wine (148 mL), or one 1 oz glass of hard liquor (44 mL). Lifestyle  Take daily care of your teeth and gums.  Stay active. Exercise for at least 30 minutes on 5 or more days each week.  Do not use any products that contain nicotine or tobacco, such as cigarettes, e-cigarettes, and chewing tobacco. If you need help quitting, ask your health care provider.  If you are sexually active, practice safe sex. Use a condom or other form of protection to prevent STIs (sexually transmitted infections).  Talk with your health care provider about taking a low-dose aspirin every day starting at age 50. What's next?  Go to your health care provider once a year for a well check visit.  Ask your health care provider how often you should have your eyes and teeth checked.  Stay up to date on all vaccines. This information is not intended to replace advice given to you by your health care provider. Make sure you discuss any questions you have with your health care provider. Document Revised: 02/13/2018 Document Reviewed: 02/13/2018 Elsevier Patient Education  2020 Elsevier Inc. .      

## 2020-02-16 ENCOUNTER — Telehealth: Payer: Self-pay | Admitting: Physician Assistant

## 2020-02-16 NOTE — Telephone Encounter (Signed)
Patient would like someone to call him with his lab results.

## 2020-02-17 ENCOUNTER — Other Ambulatory Visit: Payer: Self-pay

## 2020-02-17 ENCOUNTER — Telehealth: Payer: Self-pay

## 2020-02-17 DIAGNOSIS — M542 Cervicalgia: Secondary | ICD-10-CM

## 2020-02-17 DIAGNOSIS — E785 Hyperlipidemia, unspecified: Secondary | ICD-10-CM

## 2020-02-17 MED ORDER — IBUPROFEN 600 MG PO TABS: 600.0000 mg | ORAL_TABLET | Freq: Three times a day (TID) | ORAL | 0 refills | Status: DC | PRN

## 2020-02-17 MED ORDER — ATORVASTATIN CALCIUM 40 MG PO TABS: 40.0000 mg | ORAL_TABLET | Freq: Every day | ORAL | 1 refills | Status: DC

## 2020-02-17 NOTE — Telephone Encounter (Signed)
Patient was calling back again for lab results - Gabriel Cirri was in with a patient and she will call him back as soon as she completes prior patient at [phone]

## 2020-02-17 NOTE — Telephone Encounter (Signed)
Spoke with Edwin [name] about lab results. Edwin [name] ok with new Rx of atorvastatin 40 mg #30 with 1 refill. Sent to pharmacy. Edwin [name] will let us know what insurance states about alternate for oxempic.8 week lab only visit scheduled for repeat fasting LFTs

## 2020-02-17 NOTE — Telephone Encounter (Signed)
Lab results were given to patient my Edwin [name], [place]

## 2020-03-14 ENCOUNTER — Other Ambulatory Visit: Payer: Self-pay | Admitting: Physician Assistant

## 2020-03-14 DIAGNOSIS — E785 Hyperlipidemia, unspecified: Secondary | ICD-10-CM

## 2020-03-14 DIAGNOSIS — E119 Type 2 diabetes mellitus without complications: Secondary | ICD-10-CM

## 2020-03-28 ENCOUNTER — Other Ambulatory Visit: Payer: Self-pay | Admitting: Emergency Medicine

## 2020-03-28 DIAGNOSIS — E785 Hyperlipidemia, unspecified: Secondary | ICD-10-CM

## 2020-03-28 MED ORDER — ATORVASTATIN CALCIUM 40 MG PO TABS: 40.0000 mg | ORAL_TABLET | Freq: Every day | ORAL | 2 refills | Status: DC

## 2020-03-31 ENCOUNTER — Telehealth: Payer: Self-pay | Admitting: Physician Assistant

## 2020-03-31 NOTE — Telephone Encounter (Signed)
Pt called in stating if we could order his Caduet and Januvia.    Please advise

## 2020-04-01 ENCOUNTER — Other Ambulatory Visit: Payer: Self-pay | Admitting: Emergency Medicine

## 2020-04-01 DIAGNOSIS — E785 Hyperlipidemia, unspecified: Secondary | ICD-10-CM

## 2020-04-01 DIAGNOSIS — I10 Essential (primary) hypertension: Secondary | ICD-10-CM

## 2020-04-01 MED ORDER — AMLODIPINE-ATORVASTATIN 5-10 MG PO TABS: 1.0000 | ORAL_TABLET | Freq: Every day | ORAL | 3 refills | Status: DC

## 2020-04-01 NOTE — Telephone Encounter (Signed)
Patient patient assistance is due for the new year 2022. Patient assistance is now with [employer]. An application is ready up front for patient to complete and send for the [place].

## 2020-04-05 ENCOUNTER — Other Ambulatory Visit: Payer: Self-pay | Admitting: Emergency Medicine

## 2020-04-05 DIAGNOSIS — E119 Type 2 diabetes mellitus without complications: Secondary | ICD-10-CM

## 2020-04-05 MED ORDER — SITAGLIPTIN PHOSPHATE 100 MG PO TABS: 100.0000 mg | ORAL_TABLET | Freq: Every day | ORAL | 1 refills | Status: DC

## 2020-04-05 NOTE — Telephone Encounter (Signed)
Left detailed message on VM that his patient assistance paperwork will need to be updated. I have both the [employer][name], [place] applications in the file cabinet for completion and to be faxed and mailed

## 2020-04-11 ENCOUNTER — Other Ambulatory Visit: Payer: Self-pay | Admitting: Physician Assistant

## 2020-04-11 DIAGNOSIS — E119 Type 2 diabetes mellitus without complications: Secondary | ICD-10-CM

## 2020-04-19 ENCOUNTER — Other Ambulatory Visit: Payer: Self-pay

## 2020-04-19 ENCOUNTER — Other Ambulatory Visit (INDEPENDENT_AMBULATORY_CARE_PROVIDER_SITE_OTHER): Payer: Commercial Managed Care - PPO

## 2020-04-19 DIAGNOSIS — E785 Hyperlipidemia, unspecified: Secondary | ICD-10-CM

## 2020-04-19 LAB — HEPATIC FUNCTION PANEL
ALT: 24 U/L (ref 0–53)
AST: 16 U/L (ref 0–37)
Albumin: 4.3 g/dL (ref 3.5–5.2)
Alkaline Phosphatase: 92 U/L (ref 39–117)
Bilirubin, Direct: 0.1 mg/dL (ref 0.0–0.3)
Total Bilirubin: 0.5 mg/dL (ref 0.2–1.2)
Total Protein: 7.7 g/dL (ref 6.0–8.3)

## 2020-04-19 LAB — CHOLESTEROL, TOTAL: Cholesterol: 153 mg/dL (ref 0–200)

## 2020-04-21 ENCOUNTER — Telehealth: Payer: Self-pay | Admitting: Physician Assistant

## 2020-04-21 NOTE — Telephone Encounter (Signed)
Patient would like someone to call him back concerning his lab results from Monday - Please advise

## 2020-04-21 NOTE — Progress Notes (Signed)
Reviewed via [place].

## 2020-04-22 NOTE — Telephone Encounter (Signed)
LMOVM of lab results look good and cholesterol is improving. Continue current dose of medications.

## 2020-05-11 ENCOUNTER — Ambulatory Visit: Payer: Commercial Managed Care - PPO | Admitting: Podiatry

## 2020-05-12 ENCOUNTER — Telehealth: Payer: Self-pay

## 2020-05-12 NOTE — Telephone Encounter (Signed)
Patient is needing amLODipine-atorvastatin (CADUET) 5-10 MG tablet ordered from [employer] as he is almost out.

## 2020-05-13 NOTE — Telephone Encounter (Signed)
Spoke with Edwin [name] at [facility] for status of approval for patient enrollment renewal. Still pending, had to update patient social security Should receive updated in next 48-72 hours. Patient states he has about 2 weeks left on medication.  Please call back to check status of enrollment and to reorder patient [name] medication. [phone]

## 2020-05-17 NOTE — Telephone Encounter (Signed)
Spoke with rep and rep stated that patient was approved until 03/04/2021 and they will fax over approval letter. Also medication was ordered and should arrive by the 18th. Called and notified patient.

## 2020-05-20 ENCOUNTER — Telehealth: Payer: Self-pay

## 2020-05-20 NOTE — Telephone Encounter (Signed)
Called and spoke with patient in regards to his Rx of [name] 5mg /10mg  amlododipine besylate/atorvastatin calcium. A PA was done and patient was approved through 03/04/21. Rx was sent to office. Notified patient that he can come and pick up at his convenient.

## 2020-05-30 ENCOUNTER — Encounter: Payer: Self-pay | Admitting: Family Medicine

## 2020-05-30 ENCOUNTER — Other Ambulatory Visit: Payer: Self-pay

## 2020-05-30 ENCOUNTER — Ambulatory Visit (INDEPENDENT_AMBULATORY_CARE_PROVIDER_SITE_OTHER): Payer: Commercial Managed Care - PPO | Admitting: Family Medicine

## 2020-05-30 VITALS — BP 132/80 | HR 86 | Temp 97.5°F | Ht 71.0 in | Wt 291.2 lb

## 2020-05-30 DIAGNOSIS — I10 Essential (primary) hypertension: Secondary | ICD-10-CM | POA: Diagnosis not present

## 2020-05-30 DIAGNOSIS — E119 Type 2 diabetes mellitus without complications: Secondary | ICD-10-CM

## 2020-05-30 DIAGNOSIS — E782 Mixed hyperlipidemia: Secondary | ICD-10-CM | POA: Diagnosis not present

## 2020-05-30 DIAGNOSIS — N529 Male erectile dysfunction, unspecified: Secondary | ICD-10-CM | POA: Insufficient documentation

## 2020-05-30 DIAGNOSIS — N521 Erectile dysfunction due to diseases classified elsewhere: Secondary | ICD-10-CM

## 2020-05-30 DIAGNOSIS — J309 Allergic rhinitis, unspecified: Secondary | ICD-10-CM | POA: Insufficient documentation

## 2020-05-30 LAB — URINALYSIS, ROUTINE W REFLEX MICROSCOPIC
Bilirubin Urine: NEGATIVE
Hgb urine dipstick: NEGATIVE
Ketones, ur: NEGATIVE
Leukocytes,Ua: NEGATIVE
Nitrite: NEGATIVE
RBC / HPF: NONE SEEN (ref 0–?)
Specific Gravity, Urine: 1.02 (ref 1.000–1.030)
Total Protein, Urine: NEGATIVE
Urine Glucose: NEGATIVE
Urobilinogen, UA: 0.2 (ref 0.0–1.0)
pH: 5.5 (ref 5.0–8.0)

## 2020-05-30 LAB — BASIC METABOLIC PANEL
BUN: 11 mg/dL (ref 6–23)
CO2: 29 mEq/L (ref 19–32)
Calcium: 9.8 mg/dL (ref 8.4–10.5)
Chloride: 101 mEq/L (ref 96–112)
Creatinine, Ser: 1.04 mg/dL (ref 0.40–1.50)
GFR: 81.33 mL/min (ref >60.00)
Glucose, Bld: 218 mg/dL — ABNORMAL HIGH (ref 70–99)
Potassium: 4.3 mEq/L (ref 3.5–5.1)
Sodium: 138 mEq/L (ref 135–145)

## 2020-05-30 LAB — LIPID PANEL
Cholesterol: 159 mg/dL (ref 0–200)
HDL: 36.1 mg/dL — ABNORMAL LOW (ref >39.00)
LDL Cholesterol: 103 mg/dL — ABNORMAL HIGH (ref 0–99)
NonHDL: 123.01
Total CHOL/HDL Ratio: 4
Triglycerides: 99 mg/dL (ref 0.0–149.0)
VLDL: 19.8 mg/dL (ref 0.0–40.0)

## 2020-05-30 LAB — HEMOGLOBIN A1C: Hgb A1c MFr Bld: 10.3 % — ABNORMAL HIGH (ref 4.6–6.5)

## 2020-05-30 LAB — MICROALBUMIN / CREATININE URINE RATIO
Creatinine,U: 169.8 mg/dL
Microalb Creat Ratio: 1.1 mg/g (ref 0.0–30.0)
Microalb, Ur: 1.9 mg/dL (ref 0.0–1.9)

## 2020-05-30 MED ORDER — SILDENAFIL CITRATE 50 MG PO TABS: ORAL_TABLET | ORAL | 11 refills | Status: DC

## 2020-05-30 MED ORDER — ATORVASTATIN CALCIUM 20 MG PO TABS: 20.0000 mg | ORAL_TABLET | Freq: Every day | ORAL | 3 refills | Status: DC

## 2020-05-30 NOTE — Progress Notes (Signed)
[facility] [address] [place] [address] Dept: [phone] Dept Fax: [phone]  Transfer of Care Office Visit  Subjective:    Patient ID: Edwin [name], male    DOB: Apr 03, 1965, 55 y.o..   MRN: [id number]  Chief Complaint  Patient presents with  . Establish Care    TOC- f/u HTN/cholesterol/DM.  Fasting today.  No concerns.  Declines flu shot today.  FMLA paperwork.     History of Present Illness:  Patient is in today to establish care. Mr. Edwin [name] is originally from [place]. However, he has lived in [place] for 30+ years. He works as a Scientist, product/process development. He has a long-term SO. He has three daughters, two of which are in the [employer] and an 55 yo still at home.  Mr. Edwin [name] has a history of hypertension,. hyperlipidemia, and Type 2 DM. He is currently treated with Caduet (amlodipine/atorvastatin) 5/10 mg, but also takes an additional atorvastatin 20 mg daily. He is on metformin and sitagliptin for his diabetes management. Mr. Edwin [name] notes that he was under multiple stressors over the past year, and had stopped being adherent to his diet, exercise, and home glucose monitoring. More recently, he has returned to taking care of this. He has had 7 lbs of weight loss since last summer. He did have a recent eye exam performed. Patient requests FMLA paperwork to be completed related to his diabetes. 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  Mr. Edwin [name] has a history of erectile dysfunction. He is prescribed Viagra for this. He notes that this generally works for him.  Past Medical History: Patient Active Problem List   Diagnosis Date Noted  . Allergic rhinitis 05/30/2020  . Erectile dysfunction 05/30/2020  . Eustachian tube dysfunction, left 10/20/2019  . Seborrheic keratosis 04/15/2017  . PVC's (premature ventricular contractions)  09/03/2014  . Essential hypertension 11/03/2013  . Diabetes mellitus type II, controlled ([place]) 08/03/2013  . Hyperlipidemia 08/03/2013  . Low serum testosterone level 08/03/2013   Past Surgical History:  Procedure Laterality Date  . COLONOSCOPY  10 years ago   at [facility] "normal" exam  . NASAL SINUS SURGERY  2010  . UPPER GASTROINTESTINAL ENDOSCOPY  10 years ago  . WISDOM TOOTH EXTRACTION     Family History  Problem Relation Age of Onset  . Aneurysm Mother 72       Deceased  . Healthy Father        Living  . Prostate cancer Paternal Uncle   . Healthy Daughter        x3  . Colon cancer Neg Hx   . Esophageal cancer Neg Hx   . Rectal cancer Neg Hx   . Stomach cancer Neg Hx    Outpatient Medications Prior to Visit  Medication Sig Dispense Refill  . acetaminophen (TYLENOL) 325 MG tablet Take 2 tablets (650 mg total) by mouth every 6 (six) hours as needed for mild pain or headache (fever >/= 101).    Marland Kitchen amLODipine-atorvastatin (CADUET) 5-10 MG tablet Take 1 tablet by mouth daily. 90 tablet 3  . aspirin EC 81 MG tablet Take 1 tablet by mouth daily.    . Blood Glucose Monitoring Suppl (ONETOUCH VERIO FLEX SYSTEM) w/Device KIT 1 Units by Does not apply route daily. Dx:E11.9 1 kit 0  . fluticasone (FLONASE) 50 MCG/ACT nasal spray Place 2 sprays into  both nostrils daily as needed for allergies or rhinitis. 16 g 6  . glucose blood test strip Use as instructed daily Dx: E11.9 100 each 3  . ibuprofen (ADVIL) 600 MG tablet Take 1 tablet (600 mg total) by mouth every 8 (eight) hours as needed. 90 tablet 0  . JANUVIA 100 MG tablet TAKE 1 TABLET BY MOUTH DAILY 90 tablet 2  . metFORMIN (GLUCOPHAGE) 1000 MG tablet TAKE 1 TABLET (1,000 MG TOTAL) BY MOUTH 2 (TWO) TIMES DAILY WITH A MEAL. 180 tablet 1  . multivitamin (ONE-A-DAY MEN'S) TABS tablet Take 1 tablet by mouth daily.    Glory Edwin [name] DELICA LANCETS 31J MISC 1 Stick by Does not apply route daily. Dx:E11.9 100 each 3  . vitamin C (VITAMIN  C) 500 MG tablet Take 1 tablet (500 mg total) by mouth daily. Please take for 2 weeks    . zinc sulfate 220 (50 Zn) MG capsule Take 1 capsule (220 mg total) by mouth daily. Please take for 2 weeks    . atorvastatin (LIPITOR) 40 MG tablet Take 1 tablet (40 mg total) by mouth daily. 90 tablet 2  . sildenafil (VIAGRA) 50 MG tablet TAKE 2 TABLETS BY MOUTH DAILY AS NEEDED FOR ERECTILE DYSFUNCTION. 4 tablet 11   No facility-administered medications prior to visit.   Allergies  Allergen Reactions  . Avelox [Moxifloxacin Hcl In Nacl] Hives and Itching  . Doxycycline Rash    Objective:   Today's Vitals   05/30/20 0837  BP: 132/80  Pulse: 86  Temp: (!) 97.5 F (36.4 C)  TempSrc: Temporal  SpO2: 96%  Weight: 291 lb 3.2 oz (132.1 kg)  Height: 5' 11"  (1.803 m)   Body mass index is 40.61 kg/m.   General: Well developed, well nourished. No acute distress. CV: RRR without murmurs or rubs. Pulses 2+ bilaterally. Feet: Skin intact. Normal hair growth. DP and PT pulses are 2 +. 5.07 monofilament testing is normal. Psych: Alert and oriented. Normal mood and affect.  There are no preventive care reminders to display for this patient.    Assessment & Plan:   1. Controlled type 2 diabetes mellitus without complication, without long-term current use of insulin ([place]) I will check annual DM labs to assess current control and monitor for nephropathy. FMLA paperwork will be completed.  - Microalbumin / creatinine urine ratio - Basic metabolic panel - Hemoglobin A1c - Urinalysis, Routine w reflex microscopic  2. Essential hypertension Blood pressure is near goal today. I will consider the addition of lisinopril at his next visit, if he remains above goal.  3. Mixed hyperlipidemia I will check lipids today to assess if he is at goal on the total of 30 mg of atorvastatin a day.  - Lipid panel  4. Erectile dysfunction due to diseases classified elsewhere I will renew his Viagra.  - sildenafil  (VIAGRA) 50 MG tablet; TAKE 2 TABLETS BY MOUTH DAILY AS NEEDED FOR ERECTILE DYSFUNCTION.  Dispense: 4 tablet; Refill: 11  Edwin Salter, MD

## 2020-06-07 ENCOUNTER — Encounter: Payer: Self-pay | Admitting: Podiatry

## 2020-06-07 ENCOUNTER — Other Ambulatory Visit: Payer: Self-pay

## 2020-06-07 ENCOUNTER — Ambulatory Visit (INDEPENDENT_AMBULATORY_CARE_PROVIDER_SITE_OTHER): Payer: Commercial Managed Care - PPO | Admitting: Podiatry

## 2020-06-07 DIAGNOSIS — M2042 Other hammer toe(s) (acquired), left foot: Secondary | ICD-10-CM

## 2020-06-07 DIAGNOSIS — M2041 Other hammer toe(s) (acquired), right foot: Secondary | ICD-10-CM

## 2020-06-07 DIAGNOSIS — E119 Type 2 diabetes mellitus without complications: Secondary | ICD-10-CM | POA: Diagnosis not present

## 2020-06-07 DIAGNOSIS — E0843 Diabetes mellitus due to underlying condition with diabetic autonomic (poly)neuropathy: Secondary | ICD-10-CM | POA: Diagnosis not present

## 2020-06-07 NOTE — Progress Notes (Signed)
This patient presents to the office with chief complaint of  diabetic feet.  This patient  says there  is  no pain and discomfort in his  feet.    Patient has no history of infection or drainage from both feet.   . This patient presents  to the office today for  His annual foot exam   due to history of  diabetes.  General Appearance  Alert, conversant and in no acute stress.  Vascular  Dorsalis pedis and posterior tibial  pulses are palpable  bilaterally.  Capillary return is within normal limits  bilaterally. Temperature is within normal limits  bilaterally.  Neurologic  Senn-Weinstein monofilament wire test within normal limits  bilaterally. Vibratory sensation diminished.  Nails Thick disfigured discolored nails with subungual debris  from hallux to fifth toes bilaterally. No evidence of bacterial infection or drainage bilaterally.  Orthopedic  No limitations of motion of motion feet .  No crepitus or effusions noted.  No bony pathology or digital deformities noted. 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  No signs of infections or ulcers noted.      Diabetes with no foot complications  Diabetic foot exam reveals no evidence of vascular or neurologic pathology.  RTC 1 year   Gardiner Barefoot DPM

## 2020-06-27 ENCOUNTER — Telehealth: Payer: Self-pay

## 2020-06-27 NOTE — Telephone Encounter (Signed)
Patient is requesting refill request for caduet  to be called into [employer](patient assistance program) at [phone].  Patient's last 90 day supply was shipped to [facility] on 3/14 and received on 3/18.    Patient would like request started early stating it can sometimes take 30 days for request to process.  I have spoken with [employer].  They have stated that since last script was sent in by Elyn Aquas that Dr. Juliane Lack office would need to advise to send refill to the [facility] office location.

## 2020-06-28 ENCOUNTER — Telehealth: Payer: Self-pay

## 2020-06-28 ENCOUNTER — Encounter: Payer: Self-pay | Admitting: Family Medicine

## 2020-06-28 NOTE — Telephone Encounter (Signed)
Noted and will call patient after RX arrives.  Dm/cma

## 2020-06-28 NOTE — Telephone Encounter (Signed)
FYI: [employer] has currently processed refill for Januvia 100mg  for patient.   This medication will be sent out in a 90 day supply to the [place] office.  Please phone patient as soon as medication arrives to pick up.  Patient is currently out of this medication.  [employer] states Celesta Gentile will arrive within the next 5 business days.    Patient will have 2 more refills for 90 day supplies after this.    Dr. Juliane Lack office will have to either call in a verbal to [facility] or escribe to [facility] for the next two refills once ready.  Patient will need to get with Dr. Juliane Lack office in January 2023 to process a new application.

## 2020-06-28 NOTE — Telephone Encounter (Signed)
Called [employer] @ [phone] and spoke to [facility],  She advised to send a letter with new provider information and address so that information can be updated in their system.   As far as calling in for the next refill on medication it is too soon and will need to call them back on May 25th to schedule the next shipment which only takes 7-10 business days to get to [place].    Will call them then. Dm/cma

## 2020-06-30 NOTE — Telephone Encounter (Signed)
Medication came in and is up front, not sure if anything needs to be documented or not

## 2020-06-30 NOTE — Telephone Encounter (Signed)
Letter was faxed on 06/28/20 to [phone]. Dm/cma

## 2020-06-30 NOTE — Telephone Encounter (Signed)
lft VM that medication, Januvia, has come in from patient assistance and is up front for pick up.  Dm/cma

## 2020-07-01 NOTE — Telephone Encounter (Signed)
Spoke to patient and he will come by the office on Monday to pick up his prescription. Dm/cma

## 2020-07-15 ENCOUNTER — Telehealth: Payer: Self-pay

## 2020-07-15 NOTE — Telephone Encounter (Signed)
Patient notified VIA phone and will let him know when it comes. In. Dm/cma

## 2020-07-15 NOTE — Telephone Encounter (Signed)
The provider who originally prescribed amLODipine-atorvastatin (CADUET) 5-10 MG tablet  Advised pt Dr Gena Fray would need to prescribed this for patient now. Pt said it has to be ordered through [employer].  Any questions, pt can be reached at [phone]  Thanks

## 2020-07-15 NOTE — Telephone Encounter (Signed)
lft VM to rtn call. Dm/cma  

## 2020-07-26 NOTE — Telephone Encounter (Signed)
Called [employer], spoke to [place]; they will ship 3 bottles of (30 tabs) Caduet to our location.  RX should arrive in 5-10 days.   Order # [id number] Dm/cma

## 2020-07-29 NOTE — Telephone Encounter (Signed)
lft VM that Rx has gotten here at the office and can pick it up. Dm/cma

## 2020-08-29 ENCOUNTER — Other Ambulatory Visit: Payer: Self-pay

## 2020-08-30 ENCOUNTER — Ambulatory Visit (INDEPENDENT_AMBULATORY_CARE_PROVIDER_SITE_OTHER): Payer: Commercial Managed Care - PPO | Admitting: Family Medicine

## 2020-08-30 ENCOUNTER — Encounter: Payer: Self-pay | Admitting: Family Medicine

## 2020-08-30 ENCOUNTER — Telehealth: Payer: Self-pay

## 2020-08-30 ENCOUNTER — Other Ambulatory Visit: Payer: Self-pay

## 2020-08-30 VITALS — BP 130/78 | HR 80 | Temp 97.7°F | Ht 71.0 in | Wt 277.6 lb

## 2020-08-30 DIAGNOSIS — E119 Type 2 diabetes mellitus without complications: Secondary | ICD-10-CM | POA: Diagnosis not present

## 2020-08-30 DIAGNOSIS — E782 Mixed hyperlipidemia: Secondary | ICD-10-CM | POA: Diagnosis not present

## 2020-08-30 DIAGNOSIS — E669 Obesity, unspecified: Secondary | ICD-10-CM | POA: Insufficient documentation

## 2020-08-30 DIAGNOSIS — I10 Essential (primary) hypertension: Secondary | ICD-10-CM

## 2020-08-30 DIAGNOSIS — R591 Generalized enlarged lymph nodes: Secondary | ICD-10-CM

## 2020-08-30 LAB — LIPID PANEL
Cholesterol: 139 mg/dL (ref 0–200)
HDL: 41.7 mg/dL (ref >39.00)
LDL Cholesterol: 82 mg/dL (ref 0–99)
NonHDL: 97
Total CHOL/HDL Ratio: 3
Triglycerides: 74 mg/dL (ref 0.0–149.0)
VLDL: 14.8 mg/dL (ref 0.0–40.0)

## 2020-08-30 LAB — GLUCOSE, RANDOM: Glucose, Bld: 152 mg/dL — ABNORMAL HIGH (ref 70–99)

## 2020-08-30 LAB — HEMOGLOBIN A1C: Hgb A1c MFr Bld: 8.6 % — ABNORMAL HIGH (ref 4.6–6.5)

## 2020-08-30 MED ORDER — AMLODIPINE-ATORVASTATIN 5-20 MG PO TABS: 1.0000 | ORAL_TABLET | Freq: Every day | ORAL | 3 refills | Status: DC

## 2020-08-30 NOTE — Telephone Encounter (Signed)
Poke to [employer], she placed a note in patient account to have someone call me once the faxed RX is place into his chart so we can authorize it to be filled and sent out to our office.  Will check on this in 4-5 days if no call back. Dm/cma

## 2020-08-30 NOTE — Progress Notes (Addendum)
[facility] [address] [place] [address] Dept: [phone] Dept Fax: [phone]  Chronic Care Office Visit  Subjective:    Patient ID: Edwin [name], male    DOB: Apr 05, 1965, 55 y.o..   MRN: [id number]  Chief Complaint  Patient presents with   Follow-up    3 month f/u Diabetes.  He is fasting today.     History of Present Illness:  Patient is in today for reassessment of chronic medical issues.  Edwin [name] has a history of Type 2 diabetes. He is currently managed on metformin and Januvia. Additionally, he has been focused on weight loss. 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  Edwin [name] has a history of hypertension and hyperlipidemia. He is on Caduet (amlodipine 5 mg/atorvastatin 10 mg) and takes an additional atorvastatin 40 mg daily. He receives his Caduet through a PAP with [employer].   Edwin [name] notes, as an aside, that he has some lumps on the back of his neck. These have been present on and off for a couple of years. At times, they increase in size, but then shrink back, but never resolve. They are not necessarily tender. He denies any infections of the scalp or ear. He had these looked at once before and was told they might be lymph nodes.  Past Medical History: Patient Active Problem List   Diagnosis Date Noted   Obesity (BMI 35.0-39.9 without comorbidity) 08/30/2020   Allergic rhinitis 05/30/2020   Erectile dysfunction 05/30/2020   Eustachian tube dysfunction, left 10/20/2019   Seborrheic keratosis 04/15/2017   PVC's (premature ventricular contractions) 09/03/2014   Essential hypertension 11/03/2013   Diabetes mellitus type II, controlled ([place]) 08/03/2013   Hyperlipidemia 08/03/2013   Low serum testosterone level 08/03/2013   Past Surgical History:  Procedure Laterality Date   COLONOSCOPY  10 years ago   at [facility]  "normal" exam   NASAL SINUS SURGERY  2010   UPPER GASTROINTESTINAL ENDOSCOPY  10 years ago   WISDOM TOOTH EXTRACTION     Family History  Problem Relation Age of Onset   Aneurysm Mother 4       Deceased   Healthy Father        Living   Prostate cancer Paternal Uncle    Healthy Daughter        x3   Colon cancer Neg Hx    Esophageal cancer Neg Hx    Rectal cancer Neg Hx    Stomach cancer Neg Hx    Outpatient Medications Prior to Visit  Medication Sig Dispense Refill   acetaminophen (TYLENOL) 325 MG tablet Take 2 tablets (650 mg total) by mouth every 6 (six) hours as needed for mild pain or headache (fever >/= 101).     aspirin EC 81 MG tablet Take 1 tablet by mouth daily.     atorvastatin (LIPITOR) 40 MG tablet Take 1 tablet by mouth daily.     Blood Glucose Monitoring Suppl (ONETOUCH VERIO FLEX SYSTEM) w/Device KIT 1 Units by Does not apply route daily. Dx:E11.9 1 kit 0   fluticasone (FLONASE) 50 MCG/ACT nasal spray Place 2 sprays into both nostrils daily as needed for allergies or rhinitis. 16 g 6   glucose blood test strip Use as instructed daily Dx: E11.9 100 each 3   ibuprofen (ADVIL) 600 MG tablet Take 1 tablet (600 mg total) by mouth every 8 (  eight) hours as needed. 90 tablet 0   JANUVIA 100 MG tablet TAKE 1 TABLET BY MOUTH DAILY 90 tablet 2   metFORMIN (GLUCOPHAGE) 1000 MG tablet TAKE 1 TABLET (1,000 MG TOTAL) BY MOUTH 2 (TWO) TIMES DAILY WITH A MEAL. 180 tablet 1   multivitamin (ONE-A-DAY MEN'S) TABS tablet Take 1 tablet by mouth daily.     ONETOUCH DELICA LANCETS 72C MISC 1 Stick by Does not apply route daily. Dx:E11.9 100 each 3   sildenafil (VIAGRA) 50 MG tablet TAKE 2 TABLETS BY MOUTH DAILY AS NEEDED FOR ERECTILE DYSFUNCTION. 4 tablet 11   vitamin C (VITAMIN C) 500 MG tablet Take 1 tablet (500 mg total) by mouth daily. Please take for 2 weeks     zinc sulfate 220 (50 Zn) MG capsule Take 1 capsule (220 mg total) by mouth daily. Please take for 2 weeks      amLODipine-atorvastatin (CADUET) 5-10 MG tablet Take 1 tablet by mouth daily. 90 tablet 3   atorvastatin (LIPITOR) 20 MG tablet Take 1 tablet (20 mg total) by mouth daily. (Patient not taking: Reported on 08/30/2020) 90 tablet 3   No facility-administered medications prior to visit.   Allergies  Allergen Reactions   Avelox [Moxifloxacin Hcl In Nacl] Hives and Itching   Doxycycline Rash    Objective:   Today's Vitals   08/30/20 0908  BP: 130/78  Pulse: 80  Temp: 97.7 F (36.5 C)  TempSrc: Temporal  SpO2: 99%  Weight: 277 lb 9.6 oz (125.9 kg)  Height: $Remove'5\' 11"'InYpmFT$  (1.803 m)   Body mass index is 38.72 kg/m.   General: Well developed, well nourished. No acute distress. Neck: There are three 1.5 cm firm subcutaneous nodules in a chain over the right posterior neck (posterior to   the right SCM muscle). No redness or tenderness noted. Scalp has mild roughened appearance but no   inflammation or drainage. Psych: Alert and oriented. Normal mood and affect.  Health Maintenance Due  Topic Date Due   PNEUMOCOCCAL POLYSACCHARIDE VACCINE AGE 11-64 HIGH RISK  Never done   Pneumococcal Vaccine 10-1 Years old (1 - PCV) Never done   Hepatitis C Screening  Never done   Zoster Vaccines- Shingrix (1 of 2) Never done   COVID-19 Vaccine (4 - Booster for Pfizer series) 05/11/2020   Lab Results Lab Results  Component Value Date   HGBA1C 10.3 (H) 05/30/2020   Lab Results  Component Value Date   CHOL 159 05/30/2020   HDL 36.10 (L) 05/30/2020   LDLCALC 103 (H) 05/30/2020   LDLDIRECT 113.0 10/16/2017   TRIG 99.0 05/30/2020   CHOLHDL 4 05/30/2020     Assessment & Plan:   1. Controlled type 2 diabetes mellitus without complication, without long-term current use of insulin ([place]) We will reassess his diabetes control today. If his HbA1c remains above goal, we will consider adding a 3rd agent to his regimen. Patient is up to date on all screenings. I did recommend he consider pneumococcal  vaccination. He wants to discuss this with his wife and readdress at his next visit.  - Glucose, random - Hemoglobin A1c  2. Essential hypertension Blood pressure is at goal. Continue amlodipine.  - amLODipine-atorvastatin (CADUET) 5-20 MG tablet; Take 1 tablet by mouth daily.  Dispense: 90 tablet; Refill: 3  3. Mixed hyperlipidemia We will reassess his lipids. His last LDL remained above goal. I will change his Caduet to 5-20, which will give him a total daily dose of atorvastatin of 60 mg.  -  amLODipine-atorvastatin (CADUET) 5-20 MG tablet; Take 1 tablet by mouth daily.  Dispense: 90 tablet; Refill: 3 - Lipid panel  4. Obesity (BMI 35.0-39.9 without comorbidity) Weight is down 21 lbs in the last year. Patient will continue to focus on dietary and exercise approaches to weight loss.  5. Lymphadenopathy of head and neck The nodules appear to be lymph nodes. I suspect they may be reactive nodes, but in light of their persistence without clear sign of infection, I will refer Mr. Mccaskill to an ENT for assessment and consideration for other evaluation and/or biopsy.  - Ambulatory referral to ENT  Haydee Salter, MD

## 2020-08-30 NOTE — Telephone Encounter (Signed)
New Rx for the [facility] faxed to [facility] @ [phone]. Dm/cma

## 2020-08-31 ENCOUNTER — Telehealth: Payer: Self-pay

## 2020-08-31 DIAGNOSIS — R591 Generalized enlarged lymph nodes: Secondary | ICD-10-CM | POA: Insufficient documentation

## 2020-08-31 NOTE — Addendum Note (Signed)
Addended by: Haydee Salter on: 08/31/2020 08:32 AM   Modules accepted: Orders

## 2020-08-31 NOTE — Telephone Encounter (Signed)
New message   Patient assistance calling   amLODipine-atorvastatin (CADUET) 5-20 MG tablet was placed on yesterday the office  should receive it by 5-7- business day.

## 2020-08-31 NOTE — Telephone Encounter (Signed)
Noted. Thanks.  Will keep my eye for it to arrive. Dm/cma

## 2020-09-15 NOTE — Telephone Encounter (Signed)
Called [employer] @ [phone] spoke to [place].  Was advised that the medication was mailed out 08/30/20 and received by Santiago Glad on 09/01/20, tracking number [id number] by [employer]. Not sure if patient picked up medication or not (not documented).  Left VM to rtn call on patients number to ask.  Dm/cma

## 2020-09-16 NOTE — Telephone Encounter (Signed)
Spoke to patient VIA phone, he did pick up his meds. Dm/cma

## 2020-09-28 ENCOUNTER — Telehealth: Payer: Self-pay | Admitting: Family Medicine

## 2020-09-28 NOTE — Telephone Encounter (Signed)
Called [facility] for refill on the Januvia 100 mg through [facility]  @ [phone].  Spoke to [place] and medication will be sent out to [place] within 7-10 business days. Dm/cma

## 2020-09-28 NOTE — Telephone Encounter (Signed)
Caller Name: Pt Call back phone #: [phone]  MEDICATION(S): JANUVIA 100 MG tablet   Pt has 2 weeks on hand and said this is ordered thru pt assistance program via manufacturer and mailed to MD office

## 2020-09-30 NOTE — Telephone Encounter (Signed)
Patient notified VIA phone and meds placed up front in the folders for pick up. Dm/cma

## 2020-10-05 ENCOUNTER — Ambulatory Visit: Payer: Commercial Managed Care - PPO | Admitting: Family Medicine

## 2020-10-06 ENCOUNTER — Ambulatory Visit (INDEPENDENT_AMBULATORY_CARE_PROVIDER_SITE_OTHER): Payer: Commercial Managed Care - PPO | Admitting: Otolaryngology

## 2020-10-17 ENCOUNTER — Other Ambulatory Visit: Payer: Self-pay

## 2020-10-17 DIAGNOSIS — E119 Type 2 diabetes mellitus without complications: Secondary | ICD-10-CM

## 2020-10-21 ENCOUNTER — Telehealth: Payer: Self-pay | Admitting: Family Medicine

## 2020-10-21 NOTE — Telephone Encounter (Signed)
Pt dropped off forms to be filled out. They are in Dr Rudd's folder up front.

## 2020-10-21 NOTE — Telephone Encounter (Signed)
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

## 2020-10-24 ENCOUNTER — Other Ambulatory Visit: Payer: Self-pay

## 2020-10-24 DIAGNOSIS — E119 Type 2 diabetes mellitus without complications: Secondary | ICD-10-CM

## 2020-10-24 MED ORDER — METFORMIN HCL 1000 MG PO TABS: 1000.0000 mg | ORAL_TABLET | Freq: Two times a day (BID) | ORAL | 1 refills | Status: DC

## 2020-10-24 NOTE — Telephone Encounter (Signed)
Spoke to patient VIA regarding form and advised that form is on Dr [employer].  Will call when done and faxed.  Dm/cma

## 2020-10-24 NOTE — Telephone Encounter (Signed)
Pt called for status update. I advised him 3-5 business days needed to complete forms and return. Pt states they are due w/in 7 days to his employer/FMLA company.

## 2020-10-25 NOTE — Telephone Encounter (Signed)
Called Patient to advise that per provider that appointment needed for [facility] paperwork.  Scheduled him for 10/26/20 '@8'$ :00 am.  Patient agreeable. Dm/cma

## 2020-10-26 ENCOUNTER — Encounter: Payer: Self-pay | Admitting: Family Medicine

## 2020-10-26 ENCOUNTER — Ambulatory Visit (INDEPENDENT_AMBULATORY_CARE_PROVIDER_SITE_OTHER): Payer: BLUE CROSS/BLUE SHIELD | Admitting: Family Medicine

## 2020-10-26 ENCOUNTER — Other Ambulatory Visit: Payer: Self-pay

## 2020-10-26 VITALS — BP 110/70 | HR 95 | Temp 96.9°F | Ht 71.0 in | Wt 287.4 lb

## 2020-10-26 DIAGNOSIS — I10 Essential (primary) hypertension: Secondary | ICD-10-CM | POA: Diagnosis not present

## 2020-10-26 DIAGNOSIS — E669 Obesity, unspecified: Secondary | ICD-10-CM

## 2020-10-26 DIAGNOSIS — E119 Type 2 diabetes mellitus without complications: Secondary | ICD-10-CM

## 2020-10-26 NOTE — Progress Notes (Signed)
[facility] [address] [place] [address] Dept: [phone] Dept Fax: [phone]  Office Visit  Subjective:    Patient ID: Edwin [name], male    DOB: [birth year], 55 y.o..   MRN: [id number]  Chief Complaint  Patient presents with   Follow-up    Discuss fmla paperwork   History of Present Illness:  Patient is in today for evaluation of his diabetes related to ongoing FMLA needs. Mr. Vinzant notes that he previously had FMLA approved related to intermittent issues with either hyperglycemia or hypoglycemia. 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 He has had two episodes of hypoglycemia in the past year. These responded to eating food and came up and stabilized over a matter of a couple of hours. He notes that when his sugars are high, he increases his physical activity and water intake and is able to resolve this within a day. His previously FMLA had noted that he could have need for episodic absences lasting 1-2 days, 1-3 times a month.  Additionally, Mr. 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 He notes with the electric bus, he has to keep constant pressure on the pedal while driving to keep the bus in motion. There was concern that this could put him at risk related to his diabetes.  Mr. Caspers notes that he went on a recent cruise. Although he did exercise on ship, he admits to being off his regular diet.  Past Medical History: Patient Active Problem List   Diagnosis Date Noted   Lymphadenopathy of head and neck 08/31/2020   Obesity (BMI 35.0-39.9 without comorbidity) 08/30/2020   Allergic  rhinitis 05/30/2020   Erectile dysfunction 05/30/2020   Eustachian tube dysfunction, left 10/20/2019   Seborrheic keratosis 04/15/2017   PVC's (premature ventricular contractions) 09/03/2014   Essential hypertension 11/03/2013   Diabetes mellitus type II, controlled ([place]) 08/03/2013   Hyperlipidemia 08/03/2013   Low serum testosterone level 08/03/2013   Past Surgical History:  Procedure Laterality Date   COLONOSCOPY  10 years ago   at [facility] "normal" exam   NASAL SINUS SURGERY  2010   UPPER GASTROINTESTINAL ENDOSCOPY  10 years ago   WISDOM TOOTH EXTRACTION     Family History  Problem Relation Age of Onset   Aneurysm Mother 41       Deceased   Healthy Father        Living   Prostate cancer Paternal Uncle    Healthy Daughter        x3   Colon cancer Neg Hx    Esophageal cancer Neg Hx    Rectal cancer Neg Hx    Stomach cancer Neg Hx    Outpatient Medications Prior to Visit  Medication Sig Dispense Refill   acetaminophen (TYLENOL) 325 MG tablet Take 2 tablets (650 mg total) by mouth every 6 (six) hours as needed for mild pain or headache (fever >/= 101).     amLODipine-atorvastatin (CADUET) 5-20 MG tablet Take 1 tablet by mouth daily. 90 tablet 3   aspirin EC 81 MG tablet Take 1 tablet by mouth daily.     atorvastatin (LIPITOR) 40 MG tablet Take 1 tablet  by mouth daily.     Blood Glucose Monitoring Suppl (ONETOUCH VERIO FLEX SYSTEM) w/Device KIT 1 Units by Does not apply route daily. Dx:E11.9 1 kit 0   fluticasone (FLONASE) 50 MCG/ACT nasal spray Place 2 sprays into both nostrils daily as needed for allergies or rhinitis. 16 g 6   glucose blood test strip Use as instructed daily Dx: E11.9 100 each 3   ibuprofen (ADVIL) 600 MG tablet Take 1 tablet (600 mg total) by mouth every 8 (eight) hours as needed. 90 tablet 0   JANUVIA 100 MG tablet TAKE 1 TABLET BY MOUTH DAILY 90 tablet 2   metFORMIN (GLUCOPHAGE) 1000 MG tablet Take 1 tablet (1,000 mg total) by mouth 2 (two) times  daily with a meal. 180 tablet 1   multivitamin (ONE-A-DAY MEN'S) TABS tablet Take 1 tablet by mouth daily.     ONETOUCH DELICA LANCETS 44R MISC 1 Stick by Does not apply route daily. Dx:E11.9 100 each 3   sildenafil (VIAGRA) 50 MG tablet TAKE 2 TABLETS BY MOUTH DAILY AS NEEDED FOR ERECTILE DYSFUNCTION. 4 tablet 11   vitamin C (VITAMIN C) 500 MG tablet Take 1 tablet (500 mg total) by mouth daily. Please take for 2 weeks     zinc sulfate 220 (50 Zn) MG capsule Take 1 capsule (220 mg total) by mouth daily. Please take for 2 weeks     No facility-administered medications prior to visit.   Allergies  Allergen Reactions   Avelox [Moxifloxacin Hcl In Nacl] Hives and Itching   Doxycycline Rash    Objective:   Today's Vitals   10/26/20 0808  BP: 110/70  Pulse: 95  Temp: (!) 96.9 F (36.1 C)  TempSrc: Temporal  SpO2: 98%  Weight: 287 lb 6.4 oz (130.4 kg)  Height: 5' 11"  (1.803 m)   Body mass index is 40.08 kg/m.   General: Well developed, well nourished. No acute distress. Feet: Skin intact. Normal pulses. Psych: Alert and oriented. Normal mood and affect.  Health Maintenance Due  Topic Date Due   PNEUMOCOCCAL POLYSACCHARIDE VACCINE AGE 101-64 HIGH RISK  Never done   Pneumococcal Vaccine 44-66 Years old (1 - PCV) Never done   Hepatitis C Screening  Never done   Zoster Vaccines- Shingrix (1 of 2) Never done   COVID-19 Vaccine (4 - Booster for Pfizer series) 05/11/2020   INFLUENZA VACCINE  10/03/2020     Lab results: Lab Results  Component Value Date   HGBA1C 8.6 (H) 08/30/2020   Lab Results  Component Value Date   CHOL 139 08/30/2020   HDL 41.70 08/30/2020   LDLCALC 82 08/30/2020   LDLDIRECT 113.0 10/16/2017   TRIG 74.0 08/30/2020   CHOLHDL 3 08/30/2020   Assessment & Plan:   1. Controlled type 2 diabetes mellitus without complication, without long-term current use of insulin ([place]) FMLA paperwork completed. Letter provided for reasonable accomodation.Recommend follow-up  in 1 month for repeat HbA1c.  2. Essential hypertension Blood pressure is at goal. Continue Caduet.  3. Obesity (BMI 35.0-39.9 without comorbidity) Weight is up 10 lbs, but still lower than 6 months ago. I cautioned him about the need for continued efforts at weight loss.  Haydee Salter, MD

## 2020-11-11 ENCOUNTER — Telehealth: Payer: Self-pay | Admitting: Family Medicine

## 2020-11-11 DIAGNOSIS — J3089 Other allergic rhinitis: Secondary | ICD-10-CM

## 2020-11-11 DIAGNOSIS — M542 Cervicalgia: Secondary | ICD-10-CM

## 2020-11-11 NOTE — Telephone Encounter (Signed)
Pt requesting refill on IB profin, 600 or 800 is fine. Also needs new nasal spray.

## 2020-11-14 MED ORDER — FLUTICASONE PROPIONATE 50 MCG/ACT NA SUSP: 2.0000 | Freq: Every day | NASAL | 6 refills | Status: DC | PRN

## 2020-11-14 MED ORDER — IBUPROFEN 600 MG PO TABS: 600.0000 mg | ORAL_TABLET | Freq: Three times a day (TID) | ORAL | 0 refills | Status: DC | PRN

## 2020-11-14 NOTE — Telephone Encounter (Signed)
Please review and advise. Thanks. Dm/cma  

## 2020-11-15 NOTE — Telephone Encounter (Signed)
Patient notified VIA phone. Dm/cma  

## 2020-11-16 ENCOUNTER — Telehealth: Payer: Self-pay | Admitting: Family Medicine

## 2020-11-16 DIAGNOSIS — M542 Cervicalgia: Secondary | ICD-10-CM

## 2020-11-16 DIAGNOSIS — J3089 Other allergic rhinitis: Secondary | ICD-10-CM

## 2020-11-16 MED ORDER — FLUTICASONE PROPIONATE 50 MCG/ACT NA SUSP: 2.0000 | Freq: Every day | NASAL | 6 refills | Status: AC | PRN

## 2020-11-16 MED ORDER — IBUPROFEN 600 MG PO TABS: 600.0000 mg | ORAL_TABLET | Freq: Three times a day (TID) | ORAL | 0 refills | Status: DC | PRN

## 2020-11-16 NOTE — Telephone Encounter (Signed)
[facility] and canceled the prescriptions sent in yesterday and [facility].  Patient notified [facility] phone.  Dm/cma

## 2020-11-16 NOTE — Telephone Encounter (Signed)
Pt is needing his fluticasone (FLONASE) 50 MCG/ACT nasal spray [id number]  and his ibuprofen (ADVIL) 600 MG tablet [id number] [facility] [address] His insurance will only pay for these there.

## 2020-11-29 ENCOUNTER — Other Ambulatory Visit: Payer: Self-pay

## 2020-11-29 ENCOUNTER — Ambulatory Visit: Payer: BLUE CROSS/BLUE SHIELD | Admitting: Family Medicine

## 2020-11-29 VITALS — BP 122/78 | HR 79 | Temp 98.6°F | Ht 71.0 in | Wt 280.6 lb

## 2020-11-29 DIAGNOSIS — E119 Type 2 diabetes mellitus without complications: Secondary | ICD-10-CM

## 2020-11-29 DIAGNOSIS — E669 Obesity, unspecified: Secondary | ICD-10-CM

## 2020-11-29 DIAGNOSIS — I10 Essential (primary) hypertension: Secondary | ICD-10-CM | POA: Diagnosis not present

## 2020-11-29 DIAGNOSIS — E782 Mixed hyperlipidemia: Secondary | ICD-10-CM | POA: Diagnosis not present

## 2020-11-29 LAB — LIPID PANEL
Cholesterol: 153 mg/dL (ref 0–200)
HDL: 43.8 mg/dL (ref >39.00)
LDL Cholesterol: 88 mg/dL (ref 0–99)
NonHDL: 108.82
Total CHOL/HDL Ratio: 3
Triglycerides: 102 mg/dL (ref 0.0–149.0)
VLDL: 20.4 mg/dL (ref 0.0–40.0)

## 2020-11-29 LAB — HEMOGLOBIN A1C: Hgb A1c MFr Bld: 8.8 % — ABNORMAL HIGH (ref 4.6–6.5)

## 2020-11-29 LAB — GLUCOSE, RANDOM: Glucose, Bld: 221 mg/dL — ABNORMAL HIGH (ref 70–99)

## 2020-11-29 MED ORDER — JANUVIA 100 MG PO TABS: 100.0000 mg | ORAL_TABLET | Freq: Every day | ORAL | 3 refills | Status: DC

## 2020-11-29 NOTE — Progress Notes (Signed)
[facility] [address] [place] [address] Dept: [phone] Dept Fax: [phone]  Chronic Care Office Visit  Subjective:    Patient ID: Edwin [name], male    DOB: 1965/03/14, 55 y.o..   MRN: [id number]  Chief Complaint  Patient presents with   Follow-up    3 month f/u DM/HTN/chol. Fasting today.  Declines flu shot.    History of Present Illness:  Patient is in today for reassessment of chronic medical issues.  Edwin [name] has a history of Type 2 diabetes. He is currently managed on metformin and Januvia. Additionally, he has been focused on weight loss. 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   Edwin [name] has a history of hypertension and hyperlipidemia. He is on Caduet (amlodipine 5 mg/atorvastatin 10 mg) and takes an additional atorvastatin 40 mg daily. He receives his Caduet through a PAP with [employer].  Past Medical History: Patient Active Problem List   Diagnosis Date Noted   Lymphadenopathy of head and neck 08/31/2020   Obesity (BMI 35.0-39.9 without comorbidity) 08/30/2020   Allergic rhinitis 05/30/2020   Erectile dysfunction 05/30/2020   Eustachian tube dysfunction, left 10/20/2019   Seborrheic keratosis 04/15/2017   PVC's (premature ventricular contractions) 09/03/2014   Essential hypertension 11/03/2013   Diabetes mellitus type II, controlled ([place]) 08/03/2013   Hyperlipidemia 08/03/2013   Low serum testosterone level 08/03/2013   Past Surgical History:  Procedure Laterality Date   COLONOSCOPY  10 years ago   at [facility] "normal" exam   NASAL SINUS SURGERY  2010   UPPER GASTROINTESTINAL ENDOSCOPY  10 years ago   WISDOM TOOTH EXTRACTION     Family History  Problem Relation Age of Onset   Aneurysm Mother 62       Deceased   Healthy Father        Living   Prostate cancer Paternal Uncle    Healthy Daughter        x3   Colon cancer Neg Hx     Esophageal cancer Neg Hx    Rectal cancer Neg Hx    Stomach cancer Neg Hx    Outpatient Medications Prior to Visit  Medication Sig Dispense Refill   acetaminophen (TYLENOL) 325 MG tablet Take 2 tablets (650 mg total) by mouth every 6 (six) hours as needed for mild pain or headache (fever >/= 101).     amLODipine-atorvastatin (CADUET) 5-20 MG tablet Take 1 tablet by mouth daily. 90 tablet 3   aspirin EC 81 MG tablet Take 1 tablet by mouth daily.     atorvastatin (LIPITOR) 40 MG tablet Take 1 tablet by mouth daily.     Blood Glucose Monitoring Suppl (ONETOUCH VERIO FLEX SYSTEM) w/Device KIT 1 Units by Does not apply route daily. Dx:E11.9 1 kit 0   fluticasone (FLONASE) 50 MCG/ACT nasal spray Place 2 sprays into both nostrils daily as needed for allergies or rhinitis. 16 g 6   glucose blood test strip Use as instructed daily Dx: E11.9 100 each 3   ibuprofen (ADVIL) 600 MG tablet Take 1 tablet (600 mg total) by mouth every 8 (eight) hours as needed. 90 tablet 0   metFORMIN (GLUCOPHAGE) 1000 MG tablet Take 1 tablet (1,000 mg total) by mouth 2 (two) times daily with a meal. 180 tablet 1   multivitamin (ONE-A-DAY MEN'S) TABS tablet Take 1 tablet by mouth daily.     ONETOUCH DELICA LANCETS 16L MISC  1 Stick by Does not apply route daily. Dx:E11.9 100 each 3   sildenafil (VIAGRA) 50 MG tablet TAKE 2 TABLETS BY MOUTH DAILY AS NEEDED FOR ERECTILE DYSFUNCTION. 4 tablet 11   vitamin C (VITAMIN C) 500 MG tablet Take 1 tablet (500 mg total) by mouth daily. Please take for 2 weeks     JANUVIA 100 MG tablet TAKE 1 TABLET BY MOUTH DAILY 90 tablet 2   zinc sulfate 220 (50 Zn) MG capsule Take 1 capsule (220 mg total) by mouth daily. Please take for 2 weeks (Patient not taking: Reported on 11/29/2020)     No facility-administered medications prior to visit.   Allergies  Allergen Reactions   Avelox [Moxifloxacin Hcl In Nacl] Hives and Itching   Doxycycline Rash   Objective:   Today's Vitals   11/29/20  1117  BP: 122/78  Pulse: 79  Temp: 98.6 F (37 C)  TempSrc: Temporal  SpO2: 95%  Weight: 280 lb 9.6 oz (127.3 kg)  Height: _0  (1.803 m)   Body mass index is 39.14 kg/m.   General: Well developed, well nourished. No acute distress. Psych: Alert and oriented. Normal mood and affect.  Health Maintenance Due  Topic Date Due   Hepatitis C Screening  Never done   Zoster Vaccines- Shingrix (1 of 2) Never done   COVID-19 Vaccine (4 - Booster for Pfizer series) 05/05/2020   INFLUENZA VACCINE  Never done   OPHTHALMOLOGY EXAM  11/24/2020   Lab Results Lab Results  Component Value Date   HGBA1C 8.6 (H) 08/30/2020   Lab Results  Component Value Date   CHOL 139 08/30/2020   HDL 41.70 08/30/2020   LDLCALC 82 08/30/2020   LDLDIRECT 113.0 10/16/2017   TRIG 74.0 08/30/2020   CHOLHDL 3 08/30/2020     Assessment & Plan:   1. Controlled type 2 diabetes mellitus without complication, without long-term current use of insulin ([facility]) HbA1c has been improving (was 10.3 in March 2022). We will reassess today. I would consider adding a SGLT2 if he remains above goal.  - JANUVIA 100 MG tablet; Take 1 tablet (100 mg total) by mouth daily.  Dispense: 90 tablet; Refill: 3 - Glucose, random - Hemoglobin A1c  2. Essential hypertension Blood pressure is at goal. Continue amlodipine.  3. Mixed hyperlipidemia Lipids were improved at last check, after an increase in his statin. We will reassess today.  - Lipid panel  4. Obesity (BMI 35.0-39.9 without comorbidity) Weight is down 11 lbs. since March. Encouraged ongoing regular physical activity.  Haydee Salter, MD

## 2020-11-30 ENCOUNTER — Ambulatory Visit: Payer: Commercial Managed Care - PPO | Admitting: Family Medicine

## 2020-11-30 ENCOUNTER — Telehealth: Payer: Self-pay

## 2020-11-30 MED ORDER — DAPAGLIFLOZIN PROPANEDIOL 5 MG PO TABS: 5.0000 mg | ORAL_TABLET | Freq: Every day | ORAL | 2 refills | Status: DC

## 2020-11-30 NOTE — Telephone Encounter (Signed)
Called [facility] and ordered a 90 day supply of the [name] 5-20 mg and it will be her in 7- 10 days (conf # [id number]).  Also called [facility] 2 [phone], he is too early for a refill due to last one was sent out 09/28/20, #90.  Will call them back on 12/13/20 to order the next fill.  Patient aware.  Dm/cma

## 2020-11-30 NOTE — Addendum Note (Signed)
Addended by: Haydee Salter on: 11/30/2020 04:57 PM   Modules accepted: Orders

## 2020-12-13 NOTE — Telephone Encounter (Signed)
Spoke to [employer] at [phone] and they will ship out a 90 day supply of Juanumet and should arrive in 7-10 days. Dm/cma

## 2020-12-15 ENCOUNTER — Other Ambulatory Visit: Payer: Self-pay | Admitting: Family Medicine

## 2020-12-15 DIAGNOSIS — M542 Cervicalgia: Secondary | ICD-10-CM

## 2020-12-16 NOTE — Telephone Encounter (Signed)
Patient notified VIA phone that Celesta Gentile is here and will come by to pick up.   Placed up front.  Dm/cma

## 2021-01-16 ENCOUNTER — Other Ambulatory Visit: Payer: Self-pay | Admitting: Family Medicine

## 2021-01-16 DIAGNOSIS — M542 Cervicalgia: Secondary | ICD-10-CM

## 2021-01-19 ENCOUNTER — Telehealth: Payer: Self-pay | Admitting: Family Medicine

## 2021-01-19 DIAGNOSIS — L03019 Cellulitis of unspecified finger: Secondary | ICD-10-CM

## 2021-01-19 DIAGNOSIS — E119 Type 2 diabetes mellitus without complications: Secondary | ICD-10-CM

## 2021-01-19 MED ORDER — ATORVASTATIN CALCIUM 40 MG PO TABS: 40.0000 mg | ORAL_TABLET | Freq: Every day | ORAL | 1 refills | Status: DC

## 2021-01-19 MED ORDER — METFORMIN HCL 1000 MG PO TABS: 1000.0000 mg | ORAL_TABLET | Freq: Two times a day (BID) | ORAL | 1 refills | Status: DC

## 2021-01-19 NOTE — Telephone Encounter (Signed)
Called [facility] @[phone] for [name] 5/10 mg refills and they will send it out and should receive it here at the office by 01/25/21.   Refill for Metformin and Atorvastatin are sen to the pharmacy.   Patient notified [facility] phone. Dm/cma

## 2021-01-19 NOTE — Addendum Note (Signed)
Addended by: Konrad Saha on: 01/19/2021 02:08 PM   Modules accepted: Orders

## 2021-01-19 NOTE — Telephone Encounter (Signed)
Will call [facility] after lunch.  Dm/cma

## 2021-01-23 ENCOUNTER — Ambulatory Visit (INDEPENDENT_AMBULATORY_CARE_PROVIDER_SITE_OTHER): Payer: BLUE CROSS/BLUE SHIELD | Admitting: Podiatry

## 2021-01-23 ENCOUNTER — Other Ambulatory Visit: Payer: Self-pay

## 2021-01-23 ENCOUNTER — Encounter: Payer: Self-pay | Admitting: Podiatry

## 2021-01-23 DIAGNOSIS — E0843 Diabetes mellitus due to underlying condition with diabetic autonomic (poly)neuropathy: Secondary | ICD-10-CM

## 2021-01-23 DIAGNOSIS — M79675 Pain in left toe(s): Secondary | ICD-10-CM | POA: Diagnosis not present

## 2021-01-23 DIAGNOSIS — M79674 Pain in right toe(s): Secondary | ICD-10-CM | POA: Diagnosis not present

## 2021-01-23 DIAGNOSIS — B351 Tinea unguium: Secondary | ICD-10-CM

## 2021-01-23 NOTE — Progress Notes (Signed)
This patient presents to the office with chief complaint of  diabetic feet.  This patient  says there  is  no pain and discomfort in his  feet.    Patient has no history of infection or drainage from both feet.   . This patient presents  to the office today for evaluation of his diabetic feet.  He says he has a new doctor. Dr.  Arlester Marker,  desires he receive a diabetic foot exam.   General Appearance  Alert, conversant and in no acute stress.  Vascular  Dorsalis pedis and posterior tibial  pulses are palpable  bilaterally.  Capillary return is within normal limits  bilaterally. Temperature is within normal limits  bilaterally.  Neurologic  Senn-Weinstein monofilament wire test within normal limits  bilaterally. Vibratory sensation WNL.  Nails Thick disfigured discolored nails with subungual debris  hallux nails bilaterally. No evidence of bacterial infection or drainage bilaterally.  Orthopedic  No limitations of motion of motion feet .  No crepitus or effusions noted.  No bony pathology or digital deformities noted. 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  No signs of infections or ulcers noted.      Diabetes with no foot complications  Onychomycosis  Hallux  B/L  Diabetic foot exam reveals no evidence of vascular or neurologic pathology. Debride her nails with nail nipper followed by dremel tool.  RTC 1 year/prn   Gardiner Barefoot DPM

## 2021-01-23 NOTE — Telephone Encounter (Signed)
Pt did not receive a refill for Atorvastatin.  He couldn't remember the name of the new medication but it was for diabetes and it is making him break out in hives  Requesting call back and refill for Atorvastatin  Thank you

## 2021-01-24 ENCOUNTER — Encounter: Payer: Self-pay | Admitting: Family Medicine

## 2021-01-24 MED ORDER — ATORVASTATIN CALCIUM 40 MG PO TABS: 40.0000 mg | ORAL_TABLET | Freq: Every day | ORAL | 1 refills | Status: DC

## 2021-01-24 MED ORDER — METFORMIN HCL 1000 MG PO TABS: 1000.0000 mg | ORAL_TABLET | Freq: Two times a day (BID) | ORAL | 1 refills | Status: DC

## 2021-01-24 NOTE — Telephone Encounter (Signed)
Lft message to RTN call. Dm/cma

## 2021-01-24 NOTE — Addendum Note (Signed)
Addended by: Konrad Saha on: 01/24/2021 03:36 PM   Modules accepted: Orders

## 2021-01-24 NOTE — Telephone Encounter (Signed)
[facility] [address]  Patient states that when he took the [place] that he borke out in hives. He stopped taking this. Please advise on next steps.  Thanks.  Dm/cma

## 2021-01-25 NOTE — Telephone Encounter (Signed)
Patient notified VIA phone and will discuss at upcoming appointment on 1227/22.  Dm/cma

## 2021-01-29 ENCOUNTER — Other Ambulatory Visit: Payer: Self-pay | Admitting: Family Medicine

## 2021-01-29 DIAGNOSIS — M542 Cervicalgia: Secondary | ICD-10-CM

## 2021-02-03 MED ORDER — CEPHALEXIN 500 MG PO CAPS: 500.0000 mg | ORAL_CAPSULE | Freq: Four times a day (QID) | ORAL | 0 refills | Status: DC

## 2021-02-03 NOTE — Telephone Encounter (Signed)
Pt is having his hives come back, he's very concerned. He is wanting a call back at [phone].

## 2021-02-03 NOTE — Telephone Encounter (Signed)
Spoke to [place] @ [facility] [phone] regarding the [employer], they are on back order and no ETA on when it will come in.  They will send it out as soon as they get it.  Patient notified and he is okay for now and will call if we need to send anything in for him. Dm/cma

## 2021-02-03 NOTE — Telephone Encounter (Signed)
Spoke to patient and he will call me back with name of antibiotic that was given at the urgent care that could be causing the hives.  Dm/cma

## 2021-02-03 NOTE — Telephone Encounter (Signed)
Here are no appointment available with Dr. Gena Fray today.

## 2021-02-03 NOTE — Addendum Note (Signed)
Addended by: Haydee Salter on: 02/03/2021 11:25 AM   Modules accepted: Orders

## 2021-02-03 NOTE — Telephone Encounter (Signed)
Spoke to patient, he saw Urgent Care on 01/28/21 for finger infection around cuticle area.  Was given RX for Doxicyline, he started taking it on Sunday and broke out with hives and then stopped taking it on Thursday.   Still has hives and the finger is still tender to the touch.    Allergic to Doxicycline.  If RX sent to pharmacy [facility] [address]   Please review and advise.  Thanks.  Dm/cma

## 2021-02-20 ENCOUNTER — Ambulatory Visit: Payer: BLUE CROSS/BLUE SHIELD | Admitting: Family Medicine

## 2021-02-20 ENCOUNTER — Other Ambulatory Visit: Payer: Self-pay

## 2021-02-20 ENCOUNTER — Encounter: Payer: Self-pay | Admitting: Family Medicine

## 2021-02-20 VITALS — BP 134/76 | HR 85 | Temp 98.4°F | Wt 287.4 lb

## 2021-02-20 DIAGNOSIS — L03011 Cellulitis of right finger: Secondary | ICD-10-CM

## 2021-02-20 DIAGNOSIS — M545 Low back pain, unspecified: Secondary | ICD-10-CM

## 2021-02-20 DIAGNOSIS — M542 Cervicalgia: Secondary | ICD-10-CM | POA: Diagnosis not present

## 2021-02-20 DIAGNOSIS — N481 Balanitis: Secondary | ICD-10-CM

## 2021-02-20 DIAGNOSIS — E119 Type 2 diabetes mellitus without complications: Secondary | ICD-10-CM | POA: Diagnosis not present

## 2021-02-20 MED ORDER — CLOTRIMAZOLE 1 % EX CREA: 1.0000 "application " | TOPICAL_CREAM | Freq: Two times a day (BID) | CUTANEOUS | 0 refills | Status: DC

## 2021-02-20 MED ORDER — PIOGLITAZONE HCL 15 MG PO TABS: 15.0000 mg | ORAL_TABLET | Freq: Every day | ORAL | 3 refills | Status: DC

## 2021-02-20 MED ORDER — CYCLOBENZAPRINE HCL 10 MG PO TABS: 10.0000 mg | ORAL_TABLET | Freq: Three times a day (TID) | ORAL | 0 refills | Status: DC | PRN
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
## 2021-02-28 ENCOUNTER — Ambulatory Visit: Payer: BLUE CROSS/BLUE SHIELD | Admitting: Family Medicine

## 2021-03-08 ENCOUNTER — Telehealth: Payer: Self-pay | Admitting: Family Medicine

## 2021-03-08 ENCOUNTER — Other Ambulatory Visit: Payer: Self-pay

## 2021-03-08 ENCOUNTER — Encounter: Payer: Self-pay | Admitting: Family Medicine

## 2021-03-08 ENCOUNTER — Telehealth: Payer: Self-pay

## 2021-03-08 ENCOUNTER — Ambulatory Visit: Payer: Commercial Managed Care - PPO | Admitting: Family Medicine

## 2021-03-08 VITALS — BP 132/84 | HR 95 | Temp 98.0°F | Ht 71.0 in | Wt 289.6 lb

## 2021-03-08 DIAGNOSIS — E119 Type 2 diabetes mellitus without complications: Secondary | ICD-10-CM | POA: Diagnosis not present

## 2021-03-08 DIAGNOSIS — E782 Mixed hyperlipidemia: Secondary | ICD-10-CM | POA: Diagnosis not present

## 2021-03-08 DIAGNOSIS — N521 Erectile dysfunction due to diseases classified elsewhere: Secondary | ICD-10-CM

## 2021-03-08 DIAGNOSIS — Z1159 Encounter for screening for other viral diseases: Secondary | ICD-10-CM

## 2021-03-08 DIAGNOSIS — I10 Essential (primary) hypertension: Secondary | ICD-10-CM

## 2021-03-08 DIAGNOSIS — M542 Cervicalgia: Secondary | ICD-10-CM

## 2021-03-08 DIAGNOSIS — Z125 Encounter for screening for malignant neoplasm of prostate: Secondary | ICD-10-CM

## 2021-03-08 DIAGNOSIS — B009 Herpesviral infection, unspecified: Secondary | ICD-10-CM

## 2021-03-08 LAB — PSA: PSA: 0.32 ng/mL (ref 0.10–4.00)

## 2021-03-08 MED ORDER — SILDENAFIL CITRATE 50 MG PO TABS: ORAL_TABLET | ORAL | 11 refills | Status: DC

## 2021-03-08 MED ORDER — JANUVIA 100 MG PO TABS: 100.0000 mg | ORAL_TABLET | Freq: Every day | ORAL | 3 refills | Status: DC

## 2021-03-08 MED ORDER — IBUPROFEN 600 MG PO TABS: 600.0000 mg | ORAL_TABLET | Freq: Three times a day (TID) | ORAL | 0 refills | Status: DC | PRN

## 2021-03-08 MED ORDER — ONETOUCH VERIO FLEX SYSTEM W/DEVICE KIT: 1.0000 [IU] | PACK | Freq: Every day | 0 refills | Status: AC

## 2021-03-08 MED ORDER — ATORVASTATIN CALCIUM 40 MG PO TABS: 60.0000 mg | ORAL_TABLET | Freq: Every day | ORAL | 3 refills | Status: DC

## 2021-03-08 MED ORDER — ONETOUCH DELICA LANCETS 33G MISC: 1.0000 | Freq: Every day | 3 refills | Status: AC

## 2021-03-08 MED ORDER — AMLODIPINE BESYLATE 5 MG PO TABS: 5.0000 mg | ORAL_TABLET | Freq: Every day | ORAL | 3 refills | Status: DC

## 2021-03-08 NOTE — Telephone Encounter (Signed)
Called [employer] program for patients refill on Januvia.  Got advised that they can't do it cause the lst paperwork done was with Raiford Noble as PCP.   Went to [email or web address] and printed out enrollment forms and filled out 2nd page with Dr Rudd's information.  Then faxed it directly to [employer] @ [phone].  Dm/cma

## 2021-03-08 NOTE — Telephone Encounter (Signed)
Pt came in after his appt saying the ibprofen was not on his med refill list.

## 2021-03-08 NOTE — Telephone Encounter (Signed)
Message given to provider. Dm/cma
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
## 2021-03-09 LAB — HCV INTERPRETATION

## 2021-03-09 LAB — HCV AB W REFLEX TO QUANT PCR: HCV Ab: <0.1 s/co ratio (ref 0.0–0.9)

## 2021-03-14 NOTE — Telephone Encounter (Signed)
Patient notified VIA phone that Celesta Gentile is here at the office.  He will pick it up at front desk.  Dm/cma

## 2021-04-05 ENCOUNTER — Other Ambulatory Visit: Payer: Self-pay | Admitting: Family Medicine

## 2021-04-05 DIAGNOSIS — E119 Type 2 diabetes mellitus without complications: Secondary | ICD-10-CM

## 2021-04-14 ENCOUNTER — Other Ambulatory Visit: Payer: Self-pay | Admitting: Family Medicine

## 2021-04-14 DIAGNOSIS — M542 Cervicalgia: Secondary | ICD-10-CM

## 2021-05-11 ENCOUNTER — Telehealth: Payer: Self-pay | Admitting: Family Medicine

## 2021-05-11 NOTE — Telephone Encounter (Signed)
Lft VM to rtn call. Dm/cma  

## 2021-05-11 NOTE — Telephone Encounter (Signed)
Called patient, he states that in the morning he gets lots of drainage that is making him cough.  He has taken TheraFlu x 1 week (no other symptoms).  He does take Flonase daily.  Advised him to try an OTC Claritin or Zyrtec to see if that helps and if not call us to make an appointment to be evaluated.  Patient agreeable to plan.  Dm/cma ? ?

## 2021-05-11 NOTE — Telephone Encounter (Signed)
Pt has green mucas coming up when he coughs, mostly in the morning thought it was a cold but doesn't get better.Marland Kitchen He would like to know what he should take or can Dr. Gena Fray call something in? ?

## 2021-05-20 ENCOUNTER — Other Ambulatory Visit: Payer: Self-pay | Admitting: Family Medicine

## 2021-05-20 DIAGNOSIS — E782 Mixed hyperlipidemia: Secondary | ICD-10-CM

## 2021-05-20 DIAGNOSIS — M542 Cervicalgia: Secondary | ICD-10-CM

## 2021-06-06 ENCOUNTER — Ambulatory Visit: Payer: BLUE CROSS/BLUE SHIELD | Admitting: Family Medicine

## 2021-06-08 ENCOUNTER — Ambulatory Visit: Payer: Commercial Managed Care - PPO | Admitting: Podiatry

## 2021-06-14 ENCOUNTER — Telehealth: Payer: Self-pay | Admitting: Family Medicine

## 2021-06-14 NOTE — Telephone Encounter (Signed)
Pt called asking if he should resched his appt for Friday 4/14 because he has not received his FMLA paperwork. He is also confused as to what this appointment is for, he thought it was for his physical.. it only says FU for FMLA.  ? ?Please advise :)  ?

## 2021-06-14 NOTE — Telephone Encounter (Signed)
Spoke to patient and rescheduled to 06/26/21.  Dm/cma ? ?

## 2021-06-16 ENCOUNTER — Ambulatory Visit: Payer: BLUE CROSS/BLUE SHIELD | Admitting: Family Medicine

## 2021-06-26 ENCOUNTER — Ambulatory Visit: Payer: BLUE CROSS/BLUE SHIELD | Admitting: Family Medicine

## 2021-06-28 ENCOUNTER — Encounter: Payer: Self-pay | Admitting: Family Medicine

## 2021-06-28 ENCOUNTER — Ambulatory Visit (INDEPENDENT_AMBULATORY_CARE_PROVIDER_SITE_OTHER): Payer: Commercial Managed Care - PPO | Admitting: Family Medicine

## 2021-06-28 VITALS — BP 130/84 | HR 94 | Temp 96.7°F | Ht 71.0 in | Wt 269.7 lb

## 2021-06-28 DIAGNOSIS — I10 Essential (primary) hypertension: Secondary | ICD-10-CM

## 2021-06-28 DIAGNOSIS — E782 Mixed hyperlipidemia: Secondary | ICD-10-CM | POA: Diagnosis not present

## 2021-06-28 DIAGNOSIS — M542 Cervicalgia: Secondary | ICD-10-CM

## 2021-06-28 DIAGNOSIS — E669 Obesity, unspecified: Secondary | ICD-10-CM | POA: Diagnosis not present

## 2021-06-28 DIAGNOSIS — E119 Type 2 diabetes mellitus without complications: Secondary | ICD-10-CM | POA: Diagnosis not present

## 2021-06-28 MED ORDER — METFORMIN HCL 1000 MG PO TABS: 1000.0000 mg | ORAL_TABLET | Freq: Two times a day (BID) | ORAL | 3 refills | Status: DC

## 2021-06-28 MED ORDER — IBUPROFEN 600 MG PO TABS: 600.0000 mg | ORAL_TABLET | Freq: Three times a day (TID) | ORAL | 3 refills | Status: DC | PRN

## 2021-06-28 MED ORDER — PIOGLITAZONE HCL 15 MG PO TABS: 15.0000 mg | ORAL_TABLET | Freq: Every day | ORAL | 3 refills | Status: DC

## 2021-06-28 NOTE — Progress Notes (Signed)
?[facility] ?[address] ?[place] [address] ?Dept: [phone] ?Dept Fax: [phone] ? ?Chronic Care Office Visit ? ?Subjective:  ? ? Patient ID: Edwin [name], male    DOB: 09/23/1965, 56 y.o..   MRN: [id number] ? ?No chief complaint on file. ? ? ?History of Present Illness: ? ?Patient is in today for reassessment of chronic medical issues. ? ?Edwin [name] has a history of Type 2 diabetes. He has been managed on metformin, sitagliptin (Januvia), and pioglitazone. He notes that he has been increasing his physical activity and is pleased to see his weight has gone down 20 lbs. ?  ?Edwin [name] has a history of hypertension. He is managed on amlodipine. He notes his home blood pressure average is 120/75. ? ?Edwin [name] has a history of hyperlipidemia. He is managed on atorvastatin. ? ?Edwin [name] has period ic neck pain> he manages this with ibuprofen.  ? ?Past Medical History: ?Patient Active Problem List  ? Diagnosis Date Noted  ? Pain due to onychomycosis of toenails of both feet 01/23/2021  ? Lymphadenopathy of head and neck 08/31/2020  ? Obesity (BMI 35.0-39.9 without comorbidity) 08/30/2020  ? Allergic rhinitis 05/30/2020  ? Erectile dysfunction 05/30/2020  ? Eustachian tube dysfunction, left 10/20/2019  ? Seborrheic keratosis 04/15/2017  ? PVC's (premature ventricular contractions) 09/03/2014  ? Essential hypertension 11/03/2013  ? Diabetes mellitus type II, controlled ([place]) 08/03/2013  ? Hyperlipidemia 08/03/2013  ? Low serum testosterone level 08/03/2013  ? ?Past Surgical History:  ?Procedure Laterality Date  ? COLONOSCOPY  10 years ago  ? at [facility] "normal" exam  ? NASAL SINUS SURGERY  2010  ? UPPER GASTROINTESTINAL ENDOSCOPY  10 years ago  ? WISDOM TOOTH EXTRACTION    ? ?Family History  ?Problem Relation Age of Onset  ? Aneurysm Mother 65  ?     Deceased  ? Healthy Father   ?     Living  ? Prostate cancer Paternal Uncle   ? Healthy Daughter   ?      x3  ? Colon cancer Neg Hx   ? Esophageal cancer Neg Hx   ? Rectal cancer Neg Hx   ? Stomach cancer Neg Hx   ? ?Outpatient Medications Prior to Visit  ?Medication Sig Dispense Refill  ? acetaminophen (TYLENOL) 325 MG tablet Take 2 tablets (650 mg total) by mouth every 6 (six) hours as needed for mild pain or headache (fever >/= 101).    ? amLODipine (NORVASC) 5 MG tablet Take 1 tablet (5 mg total) by mouth daily. 90 tablet 3  ? aspirin EC 81 MG tablet Take 1 tablet by mouth daily.    ? atorvastatin (LIPITOR) 40 MG tablet Take 1.5 tablets (60 mg total) by mouth daily. 135 tablet 3  ? Blood Glucose Monitoring Suppl (ONETOUCH VERIO FLEX SYSTEM) w/Device KIT 1 Units by Does not apply route daily. Dx:E11.9 1 kit 0  ? fluticasone (FLONASE) 50 MCG/ACT nasal spray Place 2 sprays into both nostrils daily as needed for allergies or rhinitis. 16 g 6  ? glucose blood test strip Use as instructed daily Dx: E11.9 100 each 3  ? JANUVIA 100 MG tablet Take 1 tablet (100 mg total) by mouth daily. 90 tablet 3  ? multivitamin (ONE-A-DAY MEN'S) TABS tablet Take 1 tablet by mouth daily.    ? OneTouch Delica Lancets 65K MISC 1 Stick by Does not apply route daily. Dx:E11.9 100 each 3  ? sildenafil (VIAGRA) 50 MG tablet  TAKE 2 TABLETS BY MOUTH DAILY AS NEEDED FOR ERECTILE DYSFUNCTION. 4 tablet 11  ? vitamin C (VITAMIN C) 500 MG tablet Take 1 tablet (500 mg total) by mouth daily. Please take for 2 weeks    ? ibuprofen (ADVIL) 600 MG tablet TAKE 1 TABLET BY MOUTH EVERY 8 HOURS AS NEEDED 90 tablet 0  ? metFORMIN (GLUCOPHAGE) 1000 MG tablet Take 1 tablet (1,000 mg total) by mouth 2 (two) times daily with a meal. 180 tablet 1  ? pioglitazone (ACTOS) 15 MG tablet TAKE 1 TABLET (15 MG TOTAL) BY MOUTH DAILY. 90 tablet 1  ? clotrimazole (CLOTRIMAZOLE ANTI-FUNGAL) 1 % cream Apply 1 application topically 2 (two) times daily. 30 g 0  ? cyclobenzaprine (FLEXERIL) 10 MG tablet Take 1 tablet (10 mg total) by mouth 3 (three) times daily as needed for  muscle spasms. 30 tablet 0  ? ?No facility-administered medications prior to visit.  ? ?Allergies  ?Allergen Reactions  ? Avelox [Moxifloxacin Hcl In Nacl] Hives and Itching  ? [place] [Dapagliflozin] Hives  ? Doxycycline Rash  ? ?Objective:  ? ?Today's Vitals  ? 06/28/21 1526  ?BP: 130/84  ?Pulse: 94  ?Temp: (!) 96.7 ?F (35.9 ?C)  ?TempSrc: Temporal  ?SpO2: 99%  ?Weight: 269 lb 11.2 oz (122.3 kg)  ?Height: 5' 11"  (1.803 m)  ? ?Body mass index is 37.62 kg/m?.  ? ?General: Well developed, well nourished. No acute distress. ?Psych: Alert and oriented. Normal mood and affect. ? ?Health Maintenance Due  ?Topic Date Due  ? Zoster Vaccines- Shingrix (1 of 2) Never done  ? HEMOGLOBIN A1C  05/29/2021  ? URINE MICROALBUMIN  05/30/2021  ?   ?Assessment & Plan:  ? ?1. Controlled type 2 diabetes mellitus without complication, without long-term current use of insulin ([place]) ?Due for annual DM labs today. We will continue him on sitagliptin, metformin, and pioglitazone. ? ?- Microalbumin / creatinine urine ratio ?- Basic metabolic panel ?- Hemoglobin A1c ?- Urinalysis, Routine w reflex microscopic ?- metFORMIN (GLUCOPHAGE) 1000 MG tablet; Take 1 tablet (1,000 mg total) by mouth 2 (two) times daily with a meal.  Dispense: 180 tablet; Refill: 3 ?- pioglitazone (ACTOS) 15 MG tablet; Take 1 tablet (15 mg total) by mouth daily.  Dispense: 90 tablet; Refill: 3 ? ?2. Mixed hyperlipidemia ?We will check lipids today. Continue atorvastatin. ? ?- Lipid panel ? ?3. Essential hypertension ?Blood pressure at home is at goal. Continue amlodipine. ? ?4. Obesity (BMI 35.0-39.9 without comorbidity) ?Weight is down 20 lbs since last visit. I encouraged him to continue his diet and exercise efforts. He has an upcoming cruise. We discussed strategies to limit weight gain. ? ?5. Cervical pain (neck) ?I will renew his ibuprofen for as needed use. ? ?- ibuprofen (ADVIL) 600 MG tablet; Take 1 tablet (600 mg total) by mouth every 8 (eight) hours as  needed.  Dispense: 90 tablet; Refill: 3 ? ? ?Return in about 3 months (around 09/27/2021) for Reassessment.  ? ?Haydee Salter, MD ?

## 2021-06-29 LAB — MICROALBUMIN / CREATININE URINE RATIO
Creatinine,U: 109.8 mg/dL
Microalb Creat Ratio: 2.5 mg/g (ref 0.0–30.0)
Microalb, Ur: 2.7 mg/dL — ABNORMAL HIGH (ref 0.0–1.9)

## 2021-06-29 LAB — URINALYSIS, ROUTINE W REFLEX MICROSCOPIC
Bilirubin Urine: NEGATIVE
Hgb urine dipstick: NEGATIVE
Ketones, ur: NEGATIVE
Leukocytes,Ua: NEGATIVE
Nitrite: NEGATIVE
Specific Gravity, Urine: 1.015 (ref 1.000–1.030)
Total Protein, Urine: NEGATIVE
Urine Glucose: >=1000 — AB
Urobilinogen, UA: 0.2 (ref 0.0–1.0)
pH: 6 (ref 5.0–8.0)

## 2021-06-29 LAB — HEMOGLOBIN A1C: Hgb A1c MFr Bld: 14 % — ABNORMAL HIGH (ref 4.6–6.5)

## 2021-06-29 LAB — LIPID PANEL
Cholesterol: 176 mg/dL (ref 0–200)
HDL: 44 mg/dL
LDL Cholesterol: 110 mg/dL — ABNORMAL HIGH (ref 0–99)
NonHDL: 131.71
Total CHOL/HDL Ratio: 4
Triglycerides: 108 mg/dL (ref 0.0–149.0)
VLDL: 21.6 mg/dL (ref 0.0–40.0)

## 2021-06-29 LAB — BASIC METABOLIC PANEL WITH GFR
BUN: 16 mg/dL (ref 6–23)
CO2: 25 meq/L (ref 19–32)
Calcium: 9.7 mg/dL (ref 8.4–10.5)
Chloride: 99 meq/L (ref 96–112)
Creatinine, Ser: 1.15 mg/dL (ref 0.40–1.50)
GFR: 71.54 mL/min
Glucose, Bld: 272 mg/dL — ABNORMAL HIGH (ref 70–99)
Potassium: 4.2 meq/L (ref 3.5–5.1)
Sodium: 135 meq/L (ref 135–145)

## 2021-06-30 MED ORDER — PIOGLITAZONE HCL 30 MG PO TABS: 30.0000 mg | ORAL_TABLET | Freq: Every day | ORAL | 3 refills | Status: DC

## 2021-06-30 NOTE — Addendum Note (Signed)
Addended by: Haydee Salter on: 06/30/2021 10:02 AM ? ? Modules accepted: Orders ? ?

## 2021-06-30 NOTE — Progress Notes (Signed)
Lab results, provider instructions and comments viewed by patient via mychart.

## 2021-07-08 ENCOUNTER — Other Ambulatory Visit: Payer: Self-pay | Admitting: Family Medicine

## 2021-07-08 DIAGNOSIS — E782 Mixed hyperlipidemia: Secondary | ICD-10-CM

## 2021-07-10 ENCOUNTER — Telehealth: Payer: Self-pay | Admitting: Family Medicine

## 2021-07-10 NOTE — Telephone Encounter (Signed)
Pt would like to speak with Langley Gauss about his med refills. ?

## 2021-07-10 NOTE — Telephone Encounter (Signed)
Patient assistance forms filled out for Januvia 100 mg tabs. Provider signed and patient is coming by to sign them.  Once that is done I will either call and get a fax number or maile them to [employer].  Dm/cma ? ?

## 2021-07-12 ENCOUNTER — Other Ambulatory Visit: Payer: Self-pay | Admitting: Family Medicine

## 2021-07-12 ENCOUNTER — Telehealth: Payer: Self-pay

## 2021-07-12 DIAGNOSIS — Z0189 Encounter for other specified special examinations: Secondary | ICD-10-CM

## 2021-07-12 NOTE — Telephone Encounter (Signed)
Form filled out and placed in envelope to go out in the mail to: ?[facility] ?[id number] ?[place], [place] ? ?Dm/cma ? ?

## 2021-07-12 NOTE — Progress Notes (Signed)
Received letter from DOT Medical Examiner requesting Edwin [name] be considered for a sleep apnea test based on age, BMI, neck circumference, and hypertension. ? ?He does have these risk factors. I will place an order for screening. ? ?Haydee Salter, MD ?

## 2021-07-12 NOTE — Telephone Encounter (Signed)
Patient came in and signed his patient Assistance Paperwork.  He was given a form stating that he needed to get a sleep study due to risk factors.  Can you order this for him?  ?Thanks. Dm/cma ? ?

## 2021-08-01 ENCOUNTER — Telehealth: Payer: Self-pay | Admitting: Family Medicine

## 2021-08-01 NOTE — Telephone Encounter (Signed)
Pt called noting he is coming by with paperwork for patient assistance program for his Januvia. It was missing the prescriber signature and needs updated.

## 2021-08-01 NOTE — Telephone Encounter (Signed)
Lft VM to rtn call. Dm/cma  

## 2021-08-01 NOTE — Telephone Encounter (Signed)
Form information updated and place in envelope to be mailed to [employer] PT assistance.  Copy made for chart and patient. Will call him in morning to advise.  Dm/cma

## 2021-08-02 NOTE — Telephone Encounter (Signed)
Lft VM that paperwork has been filled out and re-sent to [employer] patient assistance through mail.  Copy up front for pick up. Dm/cma

## 2021-08-21 LAB — HM DIABETES EYE EXAM

## 2021-09-04 ENCOUNTER — Institutional Professional Consult (permissible substitution): Payer: BLUE CROSS/BLUE SHIELD | Admitting: Neurology

## 2021-09-12 ENCOUNTER — Ambulatory Visit (INDEPENDENT_AMBULATORY_CARE_PROVIDER_SITE_OTHER): Payer: Commercial Managed Care - PPO | Admitting: Neurology

## 2021-09-12 ENCOUNTER — Telehealth: Payer: Self-pay | Admitting: Family Medicine

## 2021-09-12 ENCOUNTER — Encounter: Payer: Self-pay | Admitting: Neurology

## 2021-09-12 VITALS — BP 129/81 | HR 74 | Ht 71.0 in | Wt 291.2 lb

## 2021-09-12 DIAGNOSIS — Z9189 Other specified personal risk factors, not elsewhere classified: Secondary | ICD-10-CM | POA: Diagnosis not present

## 2021-09-12 DIAGNOSIS — R0683 Snoring: Secondary | ICD-10-CM

## 2021-09-12 DIAGNOSIS — R7989 Other specified abnormal findings of blood chemistry: Secondary | ICD-10-CM | POA: Diagnosis not present

## 2021-09-12 DIAGNOSIS — R351 Nocturia: Secondary | ICD-10-CM

## 2021-09-12 NOTE — Telephone Encounter (Signed)
Pt is needing a refill on his atorvastatin (LIPITOR) 40 MG tablet [[id number]]  [id number] [place] [id number] Phone:  [phone] Fax:  [phone] DEA #:  [id number] He also needs JANUVIA 100 MG tablet [[[id number]]this he wants sent to [facility], he get this through a patient assistance program  If any questions pt's #[phone]

## 2021-09-12 NOTE — Telephone Encounter (Signed)
[facility] and they are getting the Atorvastain ready from RX they already had.  Also called [employer] Assistance at [phone] for [place]; was advise that it was sent out on 08/08/21 and delivered on 08/11/21.  Don't remember seeing it and asked around if anyone had.  Left VM to speak to patient to find out if he did get the medication? Dm/cma

## 2021-09-12 NOTE — Patient Instructions (Signed)

## 2021-09-12 NOTE — Progress Notes (Signed)
Subjective:    Patient ID: Edwin Edwin [name] is a 56 y.o. male.  HPI    Edwin [name] Age, MD, PhD [facility] [name]. [place],  [address]  Dear Dr. Gena [name],   I saw your patient, Edwin Edwin [name], upon your kind request, in my Sleep clinic today for initial consultation of his sleep disorder, in particular, concern for underlying obstructive sleep apnea.  The patient is unaccompanied today.  As you know, Edwin Edwin [name] is a 56 year old right-handed Edwin [name] with an underlying medical history of Edwin [name], Edwin Edwin [name], Edwin Edwin [name], Edwin Edwin [name], Edwin Edwin [name], Edwin Edwin [name], Edwin Edwin [name], who reports snoring.  He had a recent DOT physical Edwin was advised to pursue sleep apnea evaluation, due to risk factors.  I reviewed your office note from 06/28/2021.  His Epworth sleepiness score is 3 out of 24, fatigue severity score is 25 out of 63.  He is not aware of any family history of sleep apnea.  He lives with his fiance who snores which can be disturbing to him.  She has never mentioned any breathing pauses to him while he is asleep.  He denies waking up with a sense of gasping for air.  He denies recurrent morning or nocturnal headaches.  He does have nocturia about once per average night.  He drinks caffeine in the form of soda, about 3 bottles per day, 20 ounce each.  He quit smoking in 2004, he drinks very little alcohol, maybe once or twice a year on special occasions.  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  Bedtime is typically around 10 PM but could be as late as 2 AM.  Rise time is anywhere between 8 Edwin 10 AM.  He has lost weight in the past 3 years in the realm of 40 pounds.  They have a TV in the bedroom Edwin it typically stays on all night, on Edwin volume or white noise.  He has grown children.  He has no four-legged pets in the household, 2 parakeets.   His Past Medical History Is  Significant For: Past Medical History:  Diagnosis Date   Allergy    DM (Edwin [name]) ([place])    type 2   High cholesterol    Edwin Edwin [name]    Edwin Edwin [name]     His Past Surgical History Is Significant For: Past Surgical History:  Procedure Laterality Date   COLONOSCOPY  10 years ago   at [facility] "normal" exam   NASAL SINUS SURGERY  2010   UPPER GASTROINTESTINAL ENDOSCOPY  10 years ago   WISDOM TOOTH EXTRACTION      His Family History Is Significant For: Family History  Problem Relation Age of Onset   Aneurysm Mother 105       Deceased   Healthy Father        Living   Prostate cancer Paternal Uncle    Healthy Daughter        x3   Colon cancer Neg Hx    Esophageal cancer Neg Hx    Rectal cancer Neg Hx    Stomach cancer Neg Hx     His Social History Is Significant For: Social History   Socioeconomic History   Marital status: Single    Spouse name: Not on file   Number of children: Not on file   Years of education: Not on file   Highest education level: Not on file  Occupational History  Not on file  Tobacco Use   Smoking status: Former    Packs/day: 0.50    Years: 15.00    Total pack years: 7.50    Types: Cigarettes    Quit date: 04/17/2005    Years since quitting: 16.4   Smokeless tobacco: Never  Vaping Use   Vaping Use: Never used  Substance Edwin Sexual Activity   Alcohol use: No   Drug use: No   Sexual activity: Yes  Other Topics Concern   Not on file  Social History Narrative   Caffiene soda 2-3 daily.    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   Social Determinants of Health   Financial Resource Strain: Not on file  Food Insecurity: Not on file  Transportation Needs: Not on file  Physical Activity: Not on file  Stress: Not on file  Social Connections: Not on file    His Edwin Edwin [name] Are:  Edwin Edwin [name]  Allergen Reactions   Avelox [Moxifloxacin Hcl In Nacl] Hives Edwin Itching   Farxiga [Dapagliflozin] Hives   Doxycycline Rash  :   His Current  Medications Are:  Outpatient Encounter Medications as of 09/12/2021  Medication Sig   acetaminophen (TYLENOL) 325 MG tablet Take 2 tablets (650 mg total) by mouth every 6 (six) hours as needed for mild Edwin [name] or headache (fever >/= 101).   amLODipine (NORVASC) 5 MG tablet Take 1 tablet (5 mg total) by mouth daily.   aspirin EC 81 MG tablet Take 1 tablet by mouth daily.   atorvastatin (LIPITOR) 40 MG tablet Take 1.5 tablets (60 mg total) by mouth daily.   Blood Glucose Monitoring Suppl (ONETOUCH VERIO FLEX SYSTEM) w/Device KIT 1 Units by Does not apply route daily. Dx:E11.9   fluticasone (FLONASE) 50 MCG/ACT nasal spray Place 2 sprays into both nostrils daily as needed for Edwin Edwin [name] or rhinitis.   glucose blood test strip Use as instructed daily Dx: E11.9   ibuprofen (ADVIL) 600 MG tablet Take 1 tablet (600 mg total) by mouth every 8 (eight) hours as needed.   JANUVIA 100 MG tablet Take 1 tablet (100 mg total) by mouth daily.   metFORMIN (GLUCOPHAGE) 1000 MG tablet Take 1 tablet (1,000 mg total) by mouth 2 (two) times daily with a meal.   multivitamin (ONE-A-DAY MEN'S) TABS tablet Take 1 tablet by mouth daily.   OneTouch Delica Lancets 16X MISC 1 Stick by Does not apply route daily. Dx:E11.9   pioglitazone (ACTOS) 30 MG tablet Take 1 tablet (30 mg total) by mouth daily.   sildenafil (VIAGRA) 50 MG tablet TAKE 2 TABLETS BY MOUTH DAILY AS NEEDED FOR ERECTILE DYSFUNCTION.   vitamin C (VITAMIN C) 500 MG tablet Take 1 tablet (500 mg total) by mouth daily. Please take for 2 weeks   No facility-administered encounter medications on file as of 09/12/2021.  :   Review of Systems:  Out of a complete 14 point review of systems, all are reviewed Edwin negative with the exception of these symptoms as listed below:  Review of Systems  Neurological:        DOT MD wanted him checked due to big Edwin circumference  19. Edwin Edwin Edwin [name].  Has been driving for 22 yrs now.  ESS 3, FSS 25.    Objective:   Neurological Exam  Physical Exam Physical Examination:   Vitals:   09/12/21 0904  BP: 129/81  Pulse: 74    General Examination: The patient is a very pleasant 56 y.o. male in no acute distress. He appears well-developed Edwin  well-nourished Edwin well groomed.   HEENT: Normocephalic, atraumatic, pupils are equal, round Edwin reactive to light, extraocular tracking is good without limitation to gaze excursion or nystagmus noted.  Corrective eyeglasses in place.  Hearing is grossly intact. Face is symmetric with normal facial animation. Speech is clear with no dysarthria noted. There is no hypophonia. There is no lip, Edwin/head, jaw or voice tremor. Edwin is supple with full range of passive Edwin active motion. There are no carotid bruits on auscultation. Oropharynx exam reveals: Mouth dryness, good dental hygiene, moderate Edwin recurrent noting secondary to large uvula Edwin tonsillar size of 2+.  Mallampati class III.  Edwin circumference 19 inches.  He has a minimal overbite.  Tongue protrudes centrally Edwin palate elevates symmetrically.  Chest: Clear to auscultation without wheezing, rhonchi or crackles noted.  Heart: S1+S2+0, regular Edwin normal without murmurs, rubs or gallops noted.   Abdomen: Soft, non-tender Edwin non-distended.  Extremities: There is no pitting edema in the distal lower extremities bilaterally.   Skin: Warm Edwin dry without trophic changes noted.   Musculoskeletal: exam reveals no obvious joint deformities.   Neurologically:  Mental status: The patient is awake, alert Edwin oriented in all 4 spheres. His immediate Edwin remote memory, attention, language skills Edwin fund of knowledge are appropriate. There is no evidence of aphasia, agnosia, apraxia or anomia. Speech is clear with normal prosody Edwin enunciation. Thought process is linear. Mood is normal Edwin affect is normal.  Cranial nerves II - XII are as described above under HEENT exam.  Motor exam: Normal bulk, strength Edwin  tone is noted. There is no obvious tremor.  Fine motor skills Edwin coordination: grossly intact.  Cerebellar testing: No dysmetria or intention tremor. There is no truncal or gait ataxia.  Sensory exam: intact to light touch in the upper Edwin lower extremities.  Gait, station Edwin balance: He stands easily. No veering to one side is noted. No leaning to one side is noted. Posture is age-appropriate Edwin stance is narrow based. Gait shows normal stride length Edwin normal pace. No problems turning are noted.   Assessment Edwin Plan:  In summary, Jerad Leite is a very pleasant 56 y.o.-year old male with an underlying medical history of Edwin [name], Edwin Edwin [name], Edwin Edwin [name], Edwin Edwin [name], Edwin Edwin [name], Edwin Edwin [name], Edwin Edwin [name], whose history Edwin physical exam are concerning for sleep disordered breathing, supporting a current working diagnosis of unspecified sleep apnea, with the main differential diagnoses of obstructive sleep apnea (OSA) versus upper airway resistance syndrome (UARS) versus central sleep apnea (CSA), or mixed sleep apnea. A laboratory attended sleep study is considered gold standard for evaluation of sleep disordered breathing Edwin is recommended at this time Edwin clinically justified.   I had a long chat with the patient about my findings Edwin the diagnosis of sleep apnea , particularly OSA, its prognosis Edwin treatment options. We talked about medical/conservative treatments, surgical interventions Edwin non-pharmacological approaches for symptom control. I explained, in particular, the risks Edwin ramifications of untreated moderate to severe OSA, especially with respect to developing cardiovascular disease down the road, including congestive heart failure (CHF), difficult to treat Edwin Edwin [name], cardiac arrhythmias (particularly A-fib), neurovascular complications including TIA, stroke Edwin dementia. Even type 2 Edwin [name] has, in part, been linked to untreated OSA. Symptoms of untreated OSA may include  (but may not be limited to) daytime sleepiness, nocturia (i.e. frequent nighttime urination), memory problems, mood irritability Edwin suboptimally controlled or worsening mood disorder such as depression Edwin/or anxiety, lack of energy, lack of motivation, physical  discomfort, as well as recurrent headaches, especially morning or nocturnal headaches. We talked about the importance of maintaining a healthy lifestyle Edwin striving for healthy weight. In addition, we talked about the importance of striving for Edwin maintaining good sleep hygiene. I recommended the following at this time: sleep study.  I outlined the differences between a laboratory attended sleep study which is considered more comprehensive Edwin accurate over the option of a home sleep test (HST); the latter may lead to underestimation of sleep disordered breathing in some instances Edwin does not help with diagnosing upper airway resistance syndrome Edwin is not accurate enough to diagnose primary central sleep apnea typically. I explained the different sleep test procedures to the patient in detail Edwin also outlined possible surgical Edwin non-surgical treatment options of OSA, including the use of a pressure airway pressure (PAP) device (ie CPAP, AutoPAP/APAP or BiPAP in certain circumstances), a custom-made dental device (aka oral appliance, which would require a referral to a specialist dentist or orthodontist typically, Edwin is generally speaking not considered a good choice for patients with full dentures or edentulous state), upper airway surgical options, such as traditional UPPP (which is not considered a first-line treatment) or the Inspire device (hypoglossal nerve stimulator, which would involve a referral for consultation with an ENT surgeon, after careful selection, following inclusion criteria). I explained the PAP treatment option to the patient in detail, as this is generally considered first-line treatment.  The patient indicated that he would  be willing to try PAP therapy, if the need arises. I explained the importance of being compliant with PAP treatment, not only for insurance purposes but primarily to improve patient's symptoms symptoms, Edwin for the patient's long term health benefit, including to reduce His cardiovascular risks longer-term.    We will pick up our discussion about the next steps Edwin treatment options after testing.  We will keep him posted as to the test results by phone call Edwin/or MyChart messaging where possible.  We will plan to follow-up in sleep clinic accordingly as well.  I answered all his questions today Edwin the patient was in agreement.   I encouraged him to call with any interim questions, concerns, problems or updates or email [place] through [place].  Generally speaking, sleep test authorizations may take up to 2 weeks, sometimes less, sometimes longer, the patient is encouraged to get in touch with [place] if they do not hear back from the sleep lab staff directly within the next 2 weeks.  Thank you very much for allowing me to participate in the care of this nice patient. If I can be of any further assistance to you please do not hesitate to call me at [phone].  Sincerely,   Edwin [name] Age, MD, PhD

## 2021-09-27 ENCOUNTER — Ambulatory Visit: Payer: Commercial Managed Care - PPO | Admitting: Family Medicine

## 2021-09-27 ENCOUNTER — Other Ambulatory Visit: Payer: Commercial Managed Care - PPO

## 2021-09-27 ENCOUNTER — Telehealth: Payer: Self-pay | Admitting: Family Medicine

## 2021-09-27 ENCOUNTER — Telehealth: Payer: Self-pay | Admitting: Neurology

## 2021-09-27 ENCOUNTER — Encounter: Payer: Self-pay | Admitting: Family Medicine

## 2021-09-27 VITALS — BP 130/78 | HR 78 | Temp 97.4°F | Ht 71.0 in | Wt 289.0 lb

## 2021-09-27 DIAGNOSIS — Z6841 Body Mass Index (BMI) 40.0 and over, adult: Secondary | ICD-10-CM

## 2021-09-27 DIAGNOSIS — E66813 Obesity, class 3: Secondary | ICD-10-CM

## 2021-09-27 DIAGNOSIS — I10 Essential (primary) hypertension: Secondary | ICD-10-CM

## 2021-09-27 DIAGNOSIS — E119 Type 2 diabetes mellitus without complications: Secondary | ICD-10-CM | POA: Diagnosis not present

## 2021-09-27 DIAGNOSIS — E782 Mixed hyperlipidemia: Secondary | ICD-10-CM

## 2021-09-27 DIAGNOSIS — Z9189 Other specified personal risk factors, not elsewhere classified: Secondary | ICD-10-CM

## 2021-09-27 LAB — LIPID PANEL
Cholesterol: 153 mg/dL (ref 0–200)
HDL: 48.8 mg/dL (ref >39.00)
LDL Cholesterol: 91 mg/dL (ref 0–99)
NonHDL: 104.04
Total CHOL/HDL Ratio: 3
Triglycerides: 65 mg/dL (ref 0.0–149.0)
VLDL: 13 mg/dL (ref 0.0–40.0)

## 2021-09-27 LAB — GLUCOSE, RANDOM: Glucose, Bld: 164 mg/dL — ABNORMAL HIGH (ref 70–99)

## 2021-09-27 NOTE — Telephone Encounter (Signed)
[employer]- UMR no auth req ref # [id number] L on 09/27/21. Patient is scheduled at [facility] for 10/15/21 at 8 pm.  I mailed packet to the patient.

## 2021-09-27 NOTE — Progress Notes (Signed)
[facility] [address] [place] [address] Dept: [phone] Dept Fax: [phone]  Chronic Care Office Visit  Subjective:    Patient ID: Edwin [name], male    DOB: 04-05-1965, 56 y.o..   MRN: [id number]  Chief Complaint  Patient presents with   Follow-up    3 month f/u. C/o having pain in the LT ear x 2 weeks. Used Flonase and Afrin.     History of Present Illness:  Patient is in today for reassessment of chronic medical issues.  Edwin [name] has a history of Type 2 diabetes. He has been managed on metformin 1000 mg bid, sitagliptin (Januvia) 100 mg daily, and pioglitazone 30 mg daily. At his last visit, his weight was down 20 lbs. His A1c returned at 14%, indicating the weight loss was due to uncontrolled diabetes. He is now back to consistently taking his medications. Edwin [name] notes he was on Ozempic in the past and had done well, but his insurance changed and would no longer cover this medicine. He was tried on dapagliflozin, but developed hives.   Edwin [name] has a history of hypertension. He is managed on amlodipine 5 mg daily.    Edwin [name] has a history of hyperlipidemia. He is managed on atorvastatin 40 mg daily.   Edwin [name] had a request to complete a sleep study by his DOT examiner as he was deemed at risk for obstructive sleep apnea. He has had an initial consultation with Dr. Rexene [name] and is scheduled to have the study performed.  Edwin [name] has had some recent nasal congestion and ear pressure. He has a history of eustachian tube dysfunction and allergic rhinitis. He is using a daily nasal steroid spray. Recently, he used Afrin spray for three days. He wonders what else he can use.  Past Medical History: Patient Active Problem List   Diagnosis Date Noted   Pain due to onychomycosis of toenails of both feet 01/23/2021   Lymphadenopathy of head and neck 08/31/2020   Class 3 severe obesity due to excess  calories with serious comorbidity and body mass index (BMI) of 40.0 to 44.9 in adult ([place]) 08/30/2020   Allergic rhinitis 05/30/2020   Erectile dysfunction 05/30/2020   Eustachian tube dysfunction, left 10/20/2019   Seborrheic keratosis 04/15/2017   PVC's (premature ventricular contractions) 09/03/2014   Essential hypertension 11/03/2013   Type 2 diabetes mellitus ([place]) 08/03/2013   Hyperlipidemia 08/03/2013   Low serum testosterone level 08/03/2013   Past Surgical History:  Procedure Laterality Date   COLONOSCOPY  10 years ago   at [facility] "normal" exam   NASAL SINUS SURGERY  2010   UPPER GASTROINTESTINAL ENDOSCOPY  10 years ago   WISDOM TOOTH EXTRACTION     Family History  Problem Relation Age of Onset   Aneurysm Mother 27       Deceased   Healthy Father        Living   Prostate cancer Paternal Uncle    Healthy Daughter        x3   Colon cancer Neg Hx    Esophageal cancer Neg Hx    Rectal cancer Neg Hx    Stomach cancer Neg Hx    Outpatient Medications Prior to Visit  Medication Sig Dispense Refill   acetaminophen (TYLENOL) 325 MG tablet Take 2 tablets (650 mg total) by mouth every 6 (six) hours as needed for mild pain or headache (fever >/= 101).     amLODipine (NORVASC) 5  MG tablet Take 1 tablet (5 mg total) by mouth daily. 90 tablet 3   aspirin EC 81 MG tablet Take 1 tablet by mouth daily.     atorvastatin (LIPITOR) 40 MG tablet Take 1.5 tablets (60 mg total) by mouth daily. 135 tablet 3   Blood Glucose Monitoring Suppl ([place]) w/Device KIT 1 Units by Does not apply route daily. Dx:E11.9 1 kit 0   fluticasone (FLONASE) 50 MCG/ACT nasal spray Place 2 sprays into both nostrils daily as needed for allergies or rhinitis. 16 g 6   glucose blood test strip Use as instructed daily Dx: E11.9 100 each 3   ibuprofen (ADVIL) 600 MG tablet Take 1 tablet (600 mg total) by mouth every 8 (eight) hours as needed. 90 tablet 3   JANUVIA 100 MG tablet Take 1  tablet (100 mg total) by mouth daily. 90 tablet 3   metFORMIN (GLUCOPHAGE) 1000 MG tablet Take 1 tablet (1,000 mg total) by mouth 2 (two) times daily with a meal. 180 tablet 3   multivitamin (ONE-A-DAY MEN'S) TABS tablet Take 1 tablet by mouth daily.     OneTouch Delica Lancets 74Y MISC 1 Stick by Does not apply route daily. Dx:E11.9 100 each 3   pioglitazone (ACTOS) 30 MG tablet Take 1 tablet (30 mg total) by mouth daily. 90 tablet 3   sildenafil (VIAGRA) 50 MG tablet TAKE 2 TABLETS BY MOUTH DAILY AS NEEDED FOR ERECTILE DYSFUNCTION. 4 tablet 11   vitamin C (VITAMIN C) 500 MG tablet Take 1 tablet (500 mg total) by mouth daily. Please take for 2 weeks     No facility-administered medications prior to visit.   Allergies  Allergen Reactions   Avelox [Moxifloxacin Hcl In Nacl] Hives and Itching   Farxiga [Dapagliflozin] Hives   Doxycycline Rash   Objective:   Today's Vitals   09/27/21 0923  BP: 130/78  Pulse: 78  Temp: (!) 97.4 F (36.3 C)  TempSrc: Temporal  SpO2: 98%  Weight: 289 lb (131.1 kg)  Height: 5' 11"  (1.803 m)   Body mass index is 40.31 kg/m.   General: Well developed, well nourished. No acute distress. Left ear: EAC and TM normal. Psych: Alert and oriented. Normal mood and affect.  Health Maintenance Due  Topic Date Due   Zoster Vaccines- Shingrix (1 of 2) Never done   Lab Results: Lab Results  Component Value Date   HGBA1C 14.0 (H) 06/28/2021   Lab Results  Component Value Date   CHOL 176 06/28/2021   HDL 44.00 06/28/2021   LDLCALC 110 (H) 06/28/2021   LDLDIRECT 113.0 10/16/2017   TRIG 108.0 06/28/2021   CHOLHDL 4 06/28/2021     Assessment & Plan:   1. Type 2 diabetes mellitus without complication, without long-term current use of insulin [facility]) Edwin [name]'s diabetes was in poor control at his last visit. He has now regained the 20 lbs he lost, which likely reflects that his sugars are doing better. I will check his A1c today. We will continue  metformin 1000 mg bid, sitagliptin (Januvia) 100 mg daily, and pioglitazone 30 mg daily for now. I have asked him to check with his insurance to see if there is an alternative to Ozempic that may be covered. I would like to substitute this for the Januvia, to see if we can also help with his weight loss.  - Glucose, random - Hemoglobin A1c  2. Essential hypertension Blood pressure is adequately controled. Continue amlodipine 5 mg daily.  3. Mixed hyperlipidemia Continue atorvastatin 40 mg daily.  - Lipid panel  4. Class 3 severe obesity due to excess calories with serious comorbidity and body mass index (BMI) of 40.0 to 44.9 in adult ([facility]) Weight is back up. I encouraged him to continue to work at regular exercise and dietary changes. If able, we would try to start him on a GLP-1 RA as an adjunct to help his weight loss.  5. At risk for obstructive sleep apnea Pending sleep study.   Return in about 3 months (around 12/28/2021) for Reassessment.   Haydee Salter, MD

## 2021-09-27 NOTE — Telephone Encounter (Signed)
Per patient Edwin [name] ws delivered to patients home address. Dm/cma

## 2021-09-27 NOTE — Telephone Encounter (Signed)
Noted and per patient has been in touch with sleep study doctors and should be getting scheduled soon. Dm/cma

## 2021-09-27 NOTE — Telephone Encounter (Signed)
Pt is saying his insurance company has Ozempic. approved his

## 2021-09-28 LAB — HEMOGLOBIN A1C
Hgb A1c MFr Bld: 9.5 % of total Hgb — ABNORMAL HIGH (ref <5.7)
Mean Plasma Glucose: 226 mg/dL
eAG (mmol/L): 12.5 mmol/L

## 2021-10-15 ENCOUNTER — Ambulatory Visit (INDEPENDENT_AMBULATORY_CARE_PROVIDER_SITE_OTHER): Payer: Commercial Managed Care - PPO | Admitting: Neurology

## 2021-10-15 DIAGNOSIS — R351 Nocturia: Secondary | ICD-10-CM

## 2021-10-15 DIAGNOSIS — G4733 Obstructive sleep apnea (adult) (pediatric): Secondary | ICD-10-CM | POA: Diagnosis not present

## 2021-10-15 DIAGNOSIS — Z9189 Other specified personal risk factors, not elsewhere classified: Secondary | ICD-10-CM

## 2021-10-15 DIAGNOSIS — G4720 Circadian rhythm sleep disorder, unspecified type: Secondary | ICD-10-CM

## 2021-10-15 DIAGNOSIS — R7989 Other specified abnormal findings of blood chemistry: Secondary | ICD-10-CM

## 2021-10-15 DIAGNOSIS — R0683 Snoring: Secondary | ICD-10-CM

## 2021-10-23 ENCOUNTER — Telehealth: Payer: Self-pay | Admitting: Family Medicine

## 2021-10-23 NOTE — Telephone Encounter (Signed)
Caller Name: Edwin [name] Call back phone #: [phone]  Reason for Call: Pt asked for Januvia to be called in for him

## 2021-10-24 NOTE — Telephone Encounter (Signed)
called [employer] at [phone] for [employer] spoke to [place], refill placed and they will ship it to the office in 7-10 business days.  Patient notified [place] phone. Dm/cma

## 2021-10-25 NOTE — Addendum Note (Signed)
Addended by: Star Age on: 10/25/2021 06:11 PM   Modules accepted: Orders

## 2021-10-25 NOTE — Procedures (Signed)
[facility] POLYSOMNOGRAPHY  INTERPRETATION REPORT   STUDY DATE:  10/15/2021     PATIENT NAME:  Edwin [name]         DATE OF BIRTH:  [birth year]  PATIENT ID:  [id number]    TYPE OF STUDY:  PSG  READING PHYSICIAN: Star Age, MD, PhD SCORING TECHNICIAN: Vita Barley Fields   INDICATIONS: 56 year old right-handed gentleman with an underlying medical history of diabetes, hypertension, hyperlipidemia, allergies, low testosterone, neck pain, and obesity, who reports snoring. He had a recent DOT physical and was advised to pursue sleep apnea evaluation, due to risk factors. His Epworth sleepiness score is 3 out of 24, fatigue severity score is 25 out of 63.   DESCRIPTION: A sleep technologist was in attendance for the duration of the recording.  Data collection, scoring, video monitoring, and reporting were performed in compliance with the AASM Manual for the Scoring of Sleep and Associated Events; (Hypopnea is scored based on the criteria listed in Section VIII D. 1b in the AASM Manual V2.6 using a 4% oxygen desaturation rule or Hypopnea is scored based on the criteria listed in Section VIII D. 1a in the AASM Manual V2.6 using 3% oxygen desaturation and /or arousal rule).  A physician certified by the American Board of Sleep Medicine reviewed each epoch of the study.  ADDITIONAL INFORMATION:  Height: 71 in Weight: 291 lb (BMI 40) Neck Size: 19 in Medications: Tylenol, Norvasc, Aspirin, Lipitor, Flonase, Advil, Januvia, Glucophage, Multivitamin, Actos, Viagra, Vitamin C  FINDINGS:  Please refer to the attached summary for additional quantitative information.  SLEEP CONTINUITY AND SLEEP ARCHITECTURE:  Lights off was at 20:47: and lights on 04:39: (7.9 hours in bed). Total sleep time was 159.0 minutes (2.5% supine;  97.5% lateral;  0% prone, 5.0% REM sleep), with a decreased sleep efficiency at 33.7%, which is markedly reduced. Sleep latency was increased at 159.5 minutes.   REM sleep latency was normal at 62.5 minutes. Of the total sleep time, the percentage of stage N1 sleep was 8.2%, stage N2 sleep was 87%, which is markedly increased, stage N3 sleep was absent, and REM sleep was 5.0%, which is markedly reduced. There were 1 Stage R periods observed on this study night, 8 awakenings (i.e. transitions to Stage W from any sleep stage), and 27 total stage transitions. Wake after sleep onset (WASO) time accounted for 154 minutes, which is increased.  AROUSAL: There were 12 arousals in total, for an arousal index of 5 arousals/hour.  Of these, 9 were identified as respiratory-related arousals (3 /hr), 0 were PLM-related arousals (0 /hr), and 8 were non-specific arousals (3 /hr)  RESPIRATORY MONITORING:  Based on CMS criteria (using a 4% oxygen desaturation rule for scoring hypopneas), there were 0 apneas (0 obstructive; 0 central; 0 mixed), and 7 hypopneas.  Apnea index was 0.0. Hypopnea index was 2.6. The apnea-hypopnea index was 2.6 overall (15.0 supine, 15 non-supine; 15.0 REM, 0.0 supine REM). There were 0 respiratory effort-related arousals (RERAs).  The RERA index was 0 events/hr. Total respiratory disturbance index (RDI) was 2.6 events/hr. RDI results showed: supine RDI  15.0 /hr; non-supine RDI 2.3 /hr; REM RDI 15.0 /hr, supine REM RDI 0.0 /hr.   Based on AASM criteria (using a 3% oxygen desaturation and /or arousal rule for scoring hypopneas), there were 0 apneas (0 obstructive; 0 central; 0 mixed), and 26 hypopneas. Apnea index was 0.0. Hypopnea index was 9.8. The apnea-hypopnea index was 9.8 overall (30.0 supine, 30 non-supine; 30.0 REM,  0.0 supine REM). There were 0 respiratory effort-related arousals (RERAs).  The RERA index was 0 events/hr. Total respiratory disturbance index (RDI) was 9.8 events/hr. RDI results showed: supine RDI  30.0 /hr; non-supine RDI 9.3 /hr; REM RDI 30.0 /hr, supine REM RDI 0.0 /hr.  Respiratory events were associated with oxyhemoglobin  desaturations (nadir during sleep 92%) from a mean of 96%). There were 0 occurrences of Cheyne Stokes breathing.   OXIMETRY: Total sleep time spent at, or below 88% was 0.0 minutes, or 0.0% of total sleep time. Snoring was classified as . BODY POSITION: Duration of total sleep and percent of total sleep in their respective position is as follows: supine 04 minutes (3%), non-supine 155 minutes (97%); right 155 minutes (97%), left 00 minutes (0%), and prone 00 minutes (0%). Total supine REM sleep time was 00 minutes (0% of total REM sleep).  LIMB MOVEMENTS: There were 0 periodic limb movements of sleep (0.0/hr), of which 0 (0.0/hr) were associated with an arousal.  EEG: With the limited montage recorded, no EEG abnormalities were observed. CARDIAC: The electrocardiogram demonstrated normal sinus rhythm.  The average heart rate during sleep was 72 bpm.  The maximum heart rate during sleep was 85 bpm. The maximum heart rate during recording was 88.  BEHAVIORAL: No significant parasomnia behavior noted.  IMPRESSION and Diagnoses: 1. Obstructive sleep apnea, mild 2. Dysfunctions associated with sleep stages or arousals from sleep RECOMMENDATIONS: 1. This limited sleep study, demonstrates overall mild obstructive sleep apnea, severe during REM sleep with a total AHI of 9.8/hour, REM AHI of 30/hour, supine AHI of 30/hour and O2 nadir of 92%. The study was significantly limited due to low sleep efficiency, near absence of supine sleep and significantly reduced percentage of REM sleep, absence of supine REM sleep, all likely leading to an underestimation of his sleep disordered breathing. Given the patient's medical history, treatment with positive airway pressure is recommended; this can be achieved in the form of autoPAP. Alternatively, a full-night CPAP titration study would allow optimization of therapy, if needed, down the road. Other treatment options may include avoidance of supine sleep position along  with weight loss, or the use of an oral appliance in certain patients.  2.   Please note, that untreated obstructive sleep apnea may carry additional perioperative morbidity. Patients with significant obstructive sleep apnea should receive perioperative PAP therapy and the surgeons and particularly the anesthesiologist should be informed of the diagnosis and the severity of the sleep disordered breathing. 3.   This study shows sleep fragmentation and abnormal sleep stage percentages; these are nonspecific findings and per se do not signify an intrinsic sleep disorder or a cause for the patient's sleep-related symptoms. Causes include (but are not limited to) the first night effect of the sleep study, circadian rhythm disturbances, medication effect or an underlying mood disorder or medical problem.  4.   The patient should be cautioned not to drive, work at heights, or operate dangerous or heavy equipment when tired or sleepy. Review and reiteration of good sleep hygiene measures should be pursued with any patient. 5.   The patient will be seen in follow-up by Dr. Rexene Alberts at [facility] for discussion of the test results and further management strategies. The referring provider will be notified of the test results.   I certify that I have reviewed the entire raw data recording prior to the issuance of this report in accordance with the Standards of Accreditation of the American Academy of Sleep Medicine (AASM).  Takiesha Mcdevitt  Rexene Alberts,  MD, PhD

## 2021-11-07 ENCOUNTER — Telehealth: Payer: Self-pay

## 2021-11-07 NOTE — Telephone Encounter (Signed)
I called patient. I discussed his sleep study results and recommendations. He reports that since the data was limited due to low sleep efficiency, he wants Dr. Rexene Alberts to be absolutely sure that he has OSA before we proceed with the CPAP. He will also be pursing a second opinion. 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 I assured him that based on this data it does appear he has OSA, total AHI of 9.8/hour and REM AHI of 30/hour. However, he would like a call from Dr. Rexene Alberts to discuss this further.

## 2021-11-07 NOTE — Telephone Encounter (Signed)
-----   Message from Star Age, MD sent at 10/25/2021  6:11 PM EDT ----- Patient referred by Dr. Gena Fray for OSA concern after a DOT physical, seen by me on 09/12/21, diagnostic PSG on 10/15/21.    Please call and notify the patient that the recent sleep showed limited results, as he achieved little sleep. There is evidence of sleep apnea and I recommend treatment for in the form of autoPAP, which means, that we don't have to bring him back for a second sleep study with CPAP, but will let him try an autoPAP machine at home, through a DME company (of his choice, or as per insurance requirement). The DME representative will educate him on how to use the machine, how to put the mask on, etc. I have placed an order in the chart. Please send referral, talk to patient, send report to referring MD. We will need a FU in sleep clinic for 10 weeks post-PAP set up, please arrange that with me or one of our NPs. Thanks,   Star Age, MD, PhD [facility])

## 2021-11-07 NOTE — Telephone Encounter (Signed)
I called the patient and discussed his sleep study results of mild sleep apnea based on his limited sleep study report.  He does report that he did not sleep well that night, he is not used to sleeping in a strange environment without his spouse.  He is going to seek a second opinion which will be arranged through his daughter who will be seeking sleep evaluation in [place], [place] for him, she is an Therapist, sports.  If he decides to pursue treatment via AutoPap machine through [place], he will let us know, he is encouraged to call us back whenever needed.  He demonstrated understanding and agreement and was appreciated of the call.

## 2021-11-24 ENCOUNTER — Other Ambulatory Visit: Payer: Self-pay | Admitting: Family Medicine

## 2021-11-24 DIAGNOSIS — I10 Essential (primary) hypertension: Secondary | ICD-10-CM

## 2021-12-13 ENCOUNTER — Ambulatory Visit: Payer: Commercial Managed Care - PPO | Admitting: Podiatry

## 2021-12-14 ENCOUNTER — Ambulatory Visit: Payer: Commercial Managed Care - PPO | Admitting: Podiatry

## 2021-12-14 ENCOUNTER — Encounter: Payer: Self-pay | Admitting: Podiatry

## 2021-12-14 ENCOUNTER — Ambulatory Visit (INDEPENDENT_AMBULATORY_CARE_PROVIDER_SITE_OTHER): Payer: Commercial Managed Care - PPO

## 2021-12-14 DIAGNOSIS — M722 Plantar fascial fibromatosis: Secondary | ICD-10-CM

## 2021-12-14 MED ORDER — MELOXICAM 15 MG PO TABS: 15.0000 mg | ORAL_TABLET | Freq: Every day | ORAL | 3 refills | Status: DC

## 2021-12-14 MED ORDER — TRIAMCINOLONE ACETONIDE 40 MG/ML IJ SUSP
20.0000 mg | Freq: Once | INTRAMUSCULAR | Status: AC
  Administered 2021-12-14: 20 mg

## 2021-12-14 NOTE — Progress Notes (Signed)
Subjective:  Patient ID: Edwin [name], male    DOB: September 08, 1965,  MRN: [id number] HPI Chief Complaint  Patient presents with   Foot Pain    Plantar heel left - aching x 2 weeks, AM pain, tried Ibuprofen 636m and Tylenol arthritis   Diabetes    Diabetic Foot Exam - last a1c was 9.5    56y.o. male presents with the above complaint.   ROS: Denies fever chills nausea vomiting muscle aches pains calf pain back pain chest pain shortness of breath.  Past Medical History:  Diagnosis Date   Allergy    [place]abetes mellitus[place]    type 2   High cholesterol    Hypertension    Low testosterone    Past Surgical History:  Procedure Laterality Date   [facility]   NASAL SINUS SURGERY  2010   UPPER GASTROINTESTINAL ENDOSCOPY  10 years ago   WISDOM TOOTH EXTRACTION      Current Outpatient Medications:    meloxicam (MOBIC) 15 MG tablet, Take 1 tablet (15 mg total) by mouth daily., Disp: 30 tablet, Rfl: 3   acetaminophen (TYLENOL) 325 MG tablet, Take 2 tablets (650 mg total) by mouth every 6 (six) hours as needed for mild pain or headache (fever >/= 101)., Disp:  , Rfl:    amLODipine (NORVASC) 5 MG tablet, TAKE 1 TABLET (5 MG TOTAL) BY MOUTH DAILY., Disp: 90 tablet, Rfl: 2   aspirin EC 81 MG tablet, Take 1 tablet by mouth daily., Disp: , Rfl:    atorvastatin (LIPITOR) 40 MG tablet, Take 1.5 tablets (60 mg total) by mouth daily., Disp: 135 tablet, Rfl: 3   Blood Glucose Monitoring Suppl (ONETOUCH VERIO FLEX SYSTEM) w/Device KIT, 1 Units by Does not apply route daily. Dx:E11.9, Disp: 1 kit, Rfl: 0   fluticasone (FLONASE) 50 MCG/ACT nasal spray, Place 2 sprays into both nostrils daily as needed for allergies or rhinitis., Disp: 16 g, Rfl: 6   glucose blood test strip, Use as instructed daily Dx: E11.9, Disp: 100 each, Rfl: 3   ibuprofen (ADVIL) 600 MG tablet, Take 1 tablet (600 mg total) by mouth every 8 (eight) hours as needed., Disp: 90  tablet, Rfl: 3   JANUVIA 100 MG tablet, Take 1 tablet (100 mg total) by mouth daily., Disp: 90 tablet, Rfl: 3   metFORMIN (GLUCOPHAGE) 1000 MG tablet, Take 1 tablet (1,000 mg total) by mouth 2 (two) times daily with a meal., Disp: 180 tablet, Rfl: 3   multivitamin (ONE-A-DAY MEN'S) TABS tablet, Take 1 tablet by mouth daily., Disp: , Rfl:    OneTouch Delica Lancets 354TMISC, 1 Stick by Does not apply route daily. Dx:E11.9, Disp: 100 each, Rfl: 3   pioglitazone (ACTOS) 30 MG tablet, Take 1 tablet (30 mg total) by mouth daily., Disp: 90 tablet, Rfl: 3   sildenafil (VIAGRA) 50 MG tablet, TAKE 2 TABLETS BY MOUTH DAILY AS NEEDED FOR ERECTILE DYSFUNCTION., Disp: 4 tablet, Rfl: 11   vitamin C (VITAMIN C) 500 MG tablet, Take 1 tablet (500 mg total) by mouth daily. Please take for 2 weeks, Disp:  , Rfl:   Allergies  Allergen Reactions   Avelox [Moxifloxacin Hcl In Nacl] Hives and Itching   Farxiga [Dapagliflozin] Hives   Doxycycline Rash   Review of Systems Objective:  There were no vitals filed for this visit.  General: Well developed, nourished, in no acute distress, alert and oriented x3   Dermatological:  Skin is warm, dry and supple bilateral. Nails x 10 are well maintained; remaining integument appears unremarkable at this time. There are no open sores, no preulcerative lesions, no rash or signs of infection present.  Vascular: Dorsalis Pedis artery and Posterior Tibial artery pedal pulses are 2/4 bilateral with immedate capillary fill time. Pedal hair growth present. No varicosities and no lower extremity edema present bilateral.   Neruologic: Grossly intact via light touch bilateral. Vibratory intact via tuning fork bilateral. Protective threshold with Semmes Wienstein monofilament intact to all pedal sites bilateral. Patellar and Achilles deep tendon reflexes 2+ bilateral. No Babinski or clonus noted bilateral.   Musculoskeletal: No gross boney pedal deformities bilateral. No pain, crepitus,  or limitation noted with foot and ankle range of motion bilateral. Muscular strength 5/5 in all groups tested bilateral.  Pain on palpation medial calcaneal tubercle of the left foot.  He has no pain on medial-lateral compression of the left foot.  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  Gait: Unassisted, Nonantalgic.    Radiographs:  Radiographs taken today demonstrate soft tissue increase in density plantar fascial Caney insertion site with mild pes planus.  No acute findings noted.  Assessment & Plan:   Assessment: Planter fasciitis left pes planovalgus bilateral.  Plan: Discussed etiology pathology conservative versus surgical therapies at this point I injected his left heel today 20 mg Kenalog 5 mg Marcaine.  I also started him on meloxicam.  Recommended that he discontinue the ibuprofen.  Discussed appropriate shoe gear stretching exercises and ice therapy.  We will follow-up with him in 6 weeks.     Dajai Wahlert T. [place], [place]

## 2021-12-18 ENCOUNTER — Ambulatory Visit: Payer: Commercial Managed Care - PPO | Admitting: Podiatry

## 2022-01-03 ENCOUNTER — Ambulatory Visit: Payer: Commercial Managed Care - PPO | Admitting: Family Medicine

## 2022-01-03 ENCOUNTER — Encounter: Payer: Self-pay | Admitting: Family Medicine

## 2022-01-03 VITALS — BP 136/80 | HR 87 | Temp 97.7°F | Ht 71.0 in | Wt 303.6 lb

## 2022-01-03 DIAGNOSIS — Z9189 Other specified personal risk factors, not elsewhere classified: Secondary | ICD-10-CM

## 2022-01-03 DIAGNOSIS — Z6841 Body Mass Index (BMI) 40.0 and over, adult: Secondary | ICD-10-CM

## 2022-01-03 DIAGNOSIS — I10 Essential (primary) hypertension: Secondary | ICD-10-CM

## 2022-01-03 DIAGNOSIS — E119 Type 2 diabetes mellitus without complications: Secondary | ICD-10-CM

## 2022-01-03 DIAGNOSIS — E782 Mixed hyperlipidemia: Secondary | ICD-10-CM | POA: Diagnosis not present

## 2022-01-03 DIAGNOSIS — M722 Plantar fascial fibromatosis: Secondary | ICD-10-CM | POA: Insufficient documentation

## 2022-01-03 LAB — HEMOGLOBIN A1C: Hgb A1c MFr Bld: 8.4 % — ABNORMAL HIGH (ref 4.6–6.5)

## 2022-01-03 LAB — GLUCOSE, RANDOM: Glucose, Bld: 197 mg/dL — ABNORMAL HIGH (ref 70–99)

## 2022-01-03 MED ORDER — PIOGLITAZONE HCL 45 MG PO TABS: 45.0000 mg | ORAL_TABLET | Freq: Every day | ORAL | 3 refills | Status: DC

## 2022-01-03 NOTE — Progress Notes (Signed)
[facility] [address] [place] [address] Dept: [phone] Dept Fax: [phone]  Chronic Care Office Visit  Subjective:    Patient ID: Edwin [name], male    DOB: 01-09-1966, 56 y.o..   MRN: [id number]  Chief Complaint  Patient presents with   Follow-up    3 month f/u.   FMLA paper work.     History of Present Illness:  Patient is in today for reassessment of chronic medical issues.  Edwin [name] has a history of Type 2 diabetes. He has been managed on metformin 1000 mg bid, sitagliptin (Januvia) 100 mg daily, and pioglitazone 30 mg daily. Earlier this year, he had fallen off of adherence to his medications. He experienced significant weight loss and was found to have an A1c of 14%. Since being back on his meds, his diabetes had improved. He notes he occasionally has a blood sugar > 150. When it gets around 200, he often feels a bit dizzy. He will address this by increasing his fluid intake and switching to eating salads. This usually corrects in 1-2 days. He had been off of his exercise routine due to plantar fasciitis issues. He recently received a steroid shot for this and notes improvement. He has started back to doing his exercise, involving walking 3 miles a day and for 10 min when at rest stops.   Edwin [name] has a history of hypertension. He is managed on amlodipine 5 mg daily.    Edwin [name] has a history of hyperlipidemia. He is managed on atorvastatin 40 mg daily.   Edwin [name] had a request to complete a sleep study by his DOT examiner as he was deemed at risk for obstructive sleep apnea. Apparently, his initial sleep study did not provide an adequate amount of data, so they are going to try and do a home sleep study.   Past Medical History: Patient Active Problem List   Diagnosis Date Noted   At risk for obstructive sleep apnea 01/03/2022   Pain due to onychomycosis of toenails of both feet 01/23/2021    Lymphadenopathy of head and neck 08/31/2020   Class 3 severe obesity due to excess calories with serious comorbidity and body mass index (BMI) of 40.0 to 44.9 in adult ([place]) 08/30/2020   Allergic rhinitis 05/30/2020   Erectile dysfunction 05/30/2020   Eustachian tube dysfunction, left 10/20/2019   Seborrheic keratosis 04/15/2017   PVC's (premature ventricular contractions) 09/03/2014   Essential hypertension 11/03/2013   Type 2 diabetes mellitus ([place]) 08/03/2013   Hyperlipidemia 08/03/2013   Low serum testosterone level 08/03/2013   Past Surgical History:  Procedure Laterality Date   COLONOSCOPY  10 years ago   at [facility] "normal" exam   NASAL SINUS SURGERY  2010   UPPER GASTROINTESTINAL ENDOSCOPY  10 years ago   WISDOM TOOTH EXTRACTION     Family History  Problem Relation Age of Onset   Aneurysm Mother 55       Deceased   Healthy Father        Living   Prostate cancer Paternal Uncle    Healthy Daughter        x3   Colon cancer Neg Hx    Esophageal cancer Neg Hx    Rectal cancer Neg Hx    Stomach cancer Neg Hx    Outpatient Medications Prior to Visit  Medication Sig Dispense Refill   acetaminophen (TYLENOL) 325 MG tablet Take 2 tablets (650 mg total) by mouth every  6 (six) hours as needed for mild pain or headache (fever >/= 101).     amLODipine (NORVASC) 5 MG tablet TAKE 1 TABLET (5 MG TOTAL) BY MOUTH DAILY. 90 tablet 2   aspirin EC 81 MG tablet Take 1 tablet by mouth daily.     atorvastatin (LIPITOR) 40 MG tablet Take 1.5 tablets (60 mg total) by mouth daily. 135 tablet 3   Blood Glucose Monitoring Suppl ([place]) w/Device KIT 1 Units by Does not apply route daily. Dx:E11.9 1 kit 0   fluticasone (FLONASE) 50 MCG/ACT nasal spray Place 2 sprays into both nostrils daily as needed for allergies or rhinitis. 16 g 6   glucose blood test strip Use as instructed daily Dx: E11.9 100 each 3   ibuprofen (ADVIL) 600 MG tablet Take 1 tablet (600 mg total) by  mouth every 8 (eight) hours as needed. 90 tablet 3   JANUVIA 100 MG tablet Take 1 tablet (100 mg total) by mouth daily. 90 tablet 3   meloxicam (MOBIC) 15 MG tablet Take 1 tablet (15 mg total) by mouth daily. 30 tablet 3   metFORMIN (GLUCOPHAGE) 1000 MG tablet Take 1 tablet (1,000 mg total) by mouth 2 (two) times daily with a meal. 180 tablet 3   multivitamin (ONE-A-DAY MEN'S) TABS tablet Take 1 tablet by mouth daily.     OneTouch Delica Lancets 48O MISC 1 Stick by Does not apply route daily. Dx:E11.9 100 each 3   pioglitazone (ACTOS) 30 MG tablet Take 1 tablet (30 mg total) by mouth daily. 90 tablet 3   vitamin C (VITAMIN C) 500 MG tablet Take 1 tablet (500 mg total) by mouth daily. Please take for 2 weeks     sildenafil (VIAGRA) 50 MG tablet TAKE 2 TABLETS BY MOUTH DAILY AS NEEDED FOR ERECTILE DYSFUNCTION. 4 tablet 11   No facility-administered medications prior to visit.   Allergies  Allergen Reactions   Avelox [Moxifloxacin Hcl In Nacl] Hives and Itching   Farxiga [Dapagliflozin] Hives   Doxycycline Rash   Objective:   Today's Vitals   01/03/22 0944  BP: 136/80  Pulse: 87  Temp: 97.7 F (36.5 C)  TempSrc: Temporal  SpO2: 99%  Weight: (!) 303 lb 9.6 oz (137.7 kg)  Height: 5' 11" (1.803 m)   Body mass index is 42.34 kg/m.   General: Well developed, well nourished. No acute distress. Feet- Skin intact. No sign of maceration between toes. Nails are normal. Dorsalis pedis and posterior   tibial artery pulses are normal. 5.07 monofilament testing normal. Psych: Alert and oriented. Normal mood and affect.  Health Maintenance Due  Topic Date Due   OPHTHALMOLOGY EXAM  01/03/2022   Lab Results Last hemoglobin A1c Lab Results  Component Value Date   HGBA1C 9.5 (H) 09/27/2021     Lab Results  Component Value Date   CHOL 153 09/27/2021   HDL 48.80 09/27/2021   LDLCALC 91 09/27/2021   LDLDIRECT 113.0 10/16/2017   TRIG 65.0 09/27/2021   CHOLHDL 3 09/27/2021     Assessment & Plan:   1. Type 2 diabetes mellitus without complication, without long-term current use of insulin (HCC) We will reassess the A1c today. Continue metformin 1000 mg bid, sitagliptin (Januvia) 100 mg daily, and pioglitazone 30 mg daily.  - Glucose, random - Hemoglobin A1c  2. Essential hypertension Blood pressure is adequately controlled. Continue amlodipine 5 mg daily.  3. Mixed hyperlipidemia Lipids at goal. Continue atorvastatin 40 mg daily.  4. Class  3 severe obesity due to excess calories with serious comorbidity and body mass index (BMI) of 40.0 to 44.9 in adult [facility]) Weight has increased 34 lbs. since April. He had unintentionally lost weight prior to this due to uncontrolled diabetes. I encouraged him to continue his exercise efforts.  5. At risk for obstructive sleep apnea Continue working with Dr. Rexene Alberts regarding assessment for OSA.   Return in about 3 months (around 04/05/2022) for Reassessment.   Haydee Salter, MD

## 2022-01-03 NOTE — Addendum Note (Signed)
Addended by: Haydee Salter on: 01/03/2022 06:17 PM   Modules accepted: Orders

## 2022-01-04 ENCOUNTER — Telehealth: Payer: Self-pay

## 2022-01-04 NOTE — Telephone Encounter (Signed)
FMLA form filled out and faxed to [facility] @ [phone]. Copy mailed to patients home address as requested.  Dm/cma

## 2022-01-09 ENCOUNTER — Telehealth: Payer: Self-pay

## 2022-01-09 NOTE — Telephone Encounter (Signed)
Spoke to [facility] Patient Assistance Program @ [phone]. They will send out a 90 day supply of Januvia today to be mailed to home address.   Dm/cma

## 2022-01-11 ENCOUNTER — Telehealth: Payer: Self-pay

## 2022-01-11 ENCOUNTER — Telehealth: Payer: Self-pay | Admitting: Family Medicine

## 2022-01-11 NOTE — Telephone Encounter (Signed)
I was given pt's medication by mail delivery. I have given to Byron.

## 2022-01-11 NOTE — Telephone Encounter (Signed)
Called patient and advised that medication was delivered to the office dn can pick up at front desk. Dm/cma

## 2022-01-15 ENCOUNTER — Encounter: Payer: Self-pay | Admitting: Family Medicine

## 2022-01-30 ENCOUNTER — Telehealth: Payer: Self-pay | Admitting: Family Medicine

## 2022-01-30 ENCOUNTER — Ambulatory Visit (INDEPENDENT_AMBULATORY_CARE_PROVIDER_SITE_OTHER): Payer: Commercial Managed Care - PPO | Admitting: Podiatry

## 2022-01-30 ENCOUNTER — Encounter: Payer: Self-pay | Admitting: Podiatry

## 2022-01-30 ENCOUNTER — Encounter: Payer: Commercial Managed Care - PPO | Admitting: Podiatry

## 2022-01-30 DIAGNOSIS — M722 Plantar fascial fibromatosis: Secondary | ICD-10-CM | POA: Diagnosis not present

## 2022-01-30 MED ORDER — TRIAMCINOLONE ACETONIDE 40 MG/ML IJ SUSP
20.0000 mg | Freq: Once | INTRAMUSCULAR | Status: AC
  Administered 2022-01-30: 20 mg

## 2022-01-30 NOTE — Progress Notes (Signed)
This encounter was created in error - please disregard.

## 2022-01-30 NOTE — Progress Notes (Signed)
He presents today states that his Planter fasciitis is approximately 60% improved does not hurt every day maybe 5 days out of the week but is intermittent.  Continues to take his meloxicam on a regular basis.  Objective: Vital signs stable alert oriented x 3 there is no erythema edema salines drainage or discharge pain on palpation medial calcaneal tubercle of the left heel.  Assessment: Planter fasciitis left.  Plan: Reinjected left heel today 20 mg Kenalog 5 mg Marcaine point maximal tenderness.  He will continue the meloxicam.  He will continue all conservative therapies appropriate shoe gear braces etc. I will follow-up with him on an as-needed basis.

## 2022-01-30 NOTE — Telephone Encounter (Signed)
Pt stated if you want to call him something in or keep his appt for thursday

## 2022-01-30 NOTE — Telephone Encounter (Signed)
Called patient and he states that he is having red irritation in the groin area and wants to know f you can call in the same thing you did last time or does he need to keep the appointment for Thursday? Please review and advise.  Thanks. Dm/cma

## 2022-01-31 NOTE — Telephone Encounter (Signed)
Left detailed VM to keep the appointment on Thursday.  Dm/cma

## 2022-02-01 ENCOUNTER — Ambulatory Visit: Payer: Commercial Managed Care - PPO | Admitting: Family Medicine

## 2022-02-01 ENCOUNTER — Encounter: Payer: Self-pay | Admitting: Family Medicine

## 2022-02-01 VITALS — BP 138/78 | HR 86 | Temp 97.9°F | Ht 71.0 in | Wt 306.6 lb

## 2022-02-01 DIAGNOSIS — N481 Balanitis: Secondary | ICD-10-CM

## 2022-02-01 MED ORDER — CLOTRIMAZOLE 1 % EX CREA: 1.0000 | TOPICAL_CREAM | Freq: Two times a day (BID) | CUTANEOUS | 0 refills | Status: DC

## 2022-02-01 NOTE — Progress Notes (Signed)
[facility] [address] [address] Dept: [phone] Dept Fax: [phone]  Office Visit  Subjective:    Patient ID: Edwin [name], male    DOB: 21-Sep-1965, 56 y.o..   MRN: [id number]  Chief Complaint  Patient presents with   Acute Visit    C/o having a rash/irritation in groin area x 4-5 days.      History of Present Illness:  Patient is in today for with a 1-week history of itching in the groin and and the penis area. He notes that he has had a similar issue some years ago. He recalls receiving a cream for this that helped to clear it up. He does note his wife has had some recurrent issues with vaginal yeast infections and UTIs recently.  Past Medical History: Patient Active Problem List   Diagnosis Date Noted   At risk for obstructive sleep apnea 01/03/2022   Plantar fasciitis 01/03/2022   Pain due to onychomycosis of toenails of both feet 01/23/2021   Lymphadenopathy of head and neck 08/31/2020   Class 3 severe obesity due to excess calories with serious comorbidity and body mass index (BMI) of 40.0 to 44.9 in adult ([place]) 08/30/2020   Allergic rhinitis 05/30/2020   Erectile dysfunction 05/30/2020   Eustachian tube dysfunction, left 10/20/2019   Seborrheic keratosis 04/15/2017   PVC's (premature ventricular contractions) 09/03/2014   Essential hypertension 11/03/2013   Type 2 diabetes mellitus ([place]) 08/03/2013   Hyperlipidemia 08/03/2013   Low serum testosterone level 08/03/2013   Past Surgical History:  Procedure Laterality Date   COLONOSCOPY  10 years ago   at [facility] "normal" exam   NASAL SINUS SURGERY  2010   UPPER GASTROINTESTINAL ENDOSCOPY  10 years ago   WISDOM TOOTH EXTRACTION     Family History  Problem Relation Age of Onset   Aneurysm Mother 77       Deceased   Healthy Father        Living   Prostate cancer Paternal Uncle    Healthy Daughter        x3   Colon cancer Neg Hx     Esophageal cancer Neg Hx    Rectal cancer Neg Hx    Stomach cancer Neg Hx    Outpatient Medications Prior to Visit  Medication Sig Dispense Refill   acetaminophen (TYLENOL) 325 MG tablet Take 2 tablets (650 mg total) by mouth every 6 (six) hours as needed for mild pain or headache (fever >/= 101).     amLODipine (NORVASC) 5 MG tablet TAKE 1 TABLET (5 MG TOTAL) BY MOUTH DAILY. 90 tablet 2   aspirin EC 81 MG tablet Take 1 tablet by mouth daily.     atorvastatin (LIPITOR) 40 MG tablet Take 1.5 tablets (60 mg total) by mouth daily. 135 tablet 3   Blood Glucose Monitoring Suppl ([place]) w/Device KIT 1 Units by Does not apply route daily. Dx:E11.9 1 kit 0   fluticasone (FLONASE) 50 MCG/ACT nasal spray Place 2 sprays into both nostrils daily as needed for allergies or rhinitis. 16 g 6   glucose blood test strip Use as instructed daily Dx: E11.9 100 each 3   ibuprofen (ADVIL) 600 MG tablet Take 1 tablet (600 mg total) by mouth every 8 (eight) hours as needed. 90 tablet 3   JANUVIA 100 MG tablet Take 1 tablet (100 mg total) by mouth daily. 90 tablet 3   meloxicam (MOBIC) 15 MG tablet Take  1 tablet (15 mg total) by mouth daily. 30 tablet 3   metFORMIN (GLUCOPHAGE) 1000 MG tablet Take 1 tablet (1,000 mg total) by mouth 2 (two) times daily with a meal. 180 tablet 3   multivitamin (ONE-A-DAY MEN'S) TABS tablet Take 1 tablet by mouth daily.     OneTouch Delica Lancets 03F MISC 1 Stick by Does not apply route daily. Dx:E11.9 100 each 3   pioglitazone (ACTOS) 45 MG tablet Take 1 tablet (45 mg total) by mouth daily. 90 tablet 3   vitamin C (VITAMIN C) 500 MG tablet Take 1 tablet (500 mg total) by mouth daily. Please take for 2 weeks     No facility-administered medications prior to visit.   Allergies  Allergen Reactions   Avelox [Moxifloxacin Hcl In Nacl] Hives and Itching   Farxiga [Dapagliflozin] Hives   Doxycycline Rash     Objective:   Today's Vitals   02/01/22 0806  BP:  138/78  Pulse: 86  Temp: 97.9 F (36.6 C)  TempSrc: Temporal  SpO2: 96%  Weight: (!) 306 lb 9.6 oz (139.1 kg)  Height: _0  (1.803 m)   Body mass index is 42.76 kg/m.   General: Well developed, well nourished. No acute distress. GU: Uncircumcised male. The glans and foreskin are generally red and mildly swollen with   some scant discharge present. There are extensive varicose veins of the upper, inner thighs  present, but with no sing of active inflammation. Psych: Alert and oriented. Normal mood and affect.  There are no preventive care reminders to display for this patient.    Assessment & Plan:   1. Balanitis Mr. Whirley's exam is consistent with a candidal infection. He is at risk for this due to his diabetes and uncircumcised status. I iwll have him use clotrimazole cream twice a day until resolved.  - clotrimazole (CLOTRIMAZOLE AF) 1 % cream; Apply 1 Application topically 2 (two) times daily.  Dispense: 30 g; Refill: 0   Return if symptoms worsen or fail to improve.   Haydee Salter, MD

## 2022-02-22 ENCOUNTER — Other Ambulatory Visit: Payer: Self-pay | Admitting: Family Medicine

## 2022-02-22 DIAGNOSIS — N481 Balanitis: Secondary | ICD-10-CM

## 2022-03-13 ENCOUNTER — Other Ambulatory Visit: Payer: Self-pay | Admitting: Family Medicine

## 2022-03-13 DIAGNOSIS — E782 Mixed hyperlipidemia: Secondary | ICD-10-CM

## 2022-03-19 ENCOUNTER — Telehealth: Payer: Self-pay | Admitting: Family Medicine

## 2022-03-19 NOTE — Telephone Encounter (Signed)
Pt called and stated he need you to order JANUVIA 100 MG tablet [[id number]]   Order Details

## 2022-03-20 NOTE — Telephone Encounter (Signed)
Will call to order tomorrow.  Dm/cma

## 2022-03-22 ENCOUNTER — Other Ambulatory Visit: Payer: Self-pay | Admitting: Family Medicine

## 2022-03-22 DIAGNOSIS — E119 Type 2 diabetes mellitus without complications: Secondary | ICD-10-CM

## 2022-03-22 NOTE — Telephone Encounter (Signed)
Called [facility] @ [phone] and they will send out his Januvia to our office.  Will print off his next paperwork due for patient Assistance on 07/07/22.  Dm/cma

## 2022-04-08 ENCOUNTER — Other Ambulatory Visit: Payer: Self-pay | Admitting: Podiatry

## 2022-04-09 ENCOUNTER — Telehealth: Payer: Self-pay | Admitting: Family Medicine

## 2022-04-09 DIAGNOSIS — E119 Type 2 diabetes mellitus without complications: Secondary | ICD-10-CM

## 2022-04-09 MED ORDER — OZEMPIC (0.25 OR 0.5 MG/DOSE) 2 MG/3ML ~~LOC~~ SOPN: 0.2500 mg | PEN_INJECTOR | SUBCUTANEOUS | 0 refills | Status: DC

## 2022-04-09 NOTE — Telephone Encounter (Signed)
Caller Name: Jakwon Call back phone #: [phone]   MEDICATION(S):  Semaglutide,0.25 or 0.'5MG'$ /DOS, (OZEMPIC, 0.25 OR 0.5 MG/DOSE,) 2 MG/1.5ML SOPN   Pt asking if we can try to order Ozempic for him again. He said his wife is recently diagnosed diabetic and it is covered. They have the same employer and insurance.  Preferred Pharmacy:  [id number] [place] [place]

## 2022-04-10 NOTE — Telephone Encounter (Signed)
Lft VM to rtn call. Dm/cma  

## 2022-04-10 NOTE — Telephone Encounter (Signed)
Spoke to patient, he is aware and will schedule 1 month after starting Ozempic.  He is aware that a PA might have to be done in order to have insurance pay. Dm/cma

## 2022-04-13 ENCOUNTER — Ambulatory Visit (INDEPENDENT_AMBULATORY_CARE_PROVIDER_SITE_OTHER): Payer: Commercial Managed Care - PPO | Admitting: Family Medicine

## 2022-04-13 ENCOUNTER — Encounter: Payer: Self-pay | Admitting: Family Medicine

## 2022-04-13 VITALS — BP 132/78 | HR 83 | Temp 98.7°F | Ht 71.0 in | Wt 310.0 lb

## 2022-04-13 DIAGNOSIS — L304 Erythema intertrigo: Secondary | ICD-10-CM | POA: Diagnosis not present

## 2022-04-13 MED ORDER — CLOTRIMAZOLE-BETAMETHASONE 1-0.05 % EX CREA: 1.0000 | TOPICAL_CREAM | Freq: Every day | CUTANEOUS | 0 refills | Status: DC

## 2022-04-13 NOTE — Progress Notes (Signed)
[facility] [address] [place] [address] Dept: [phone] Dept Fax: [phone]  Office Visit  Subjective:    Patient ID: Edwin [name], male    DOB: 13-Apr-1965, 57 y.o..   MRN: [id number]  Chief Complaint  Patient presents with   Rash    C/o having a rash on chest x 4-5 days.  Using benadryl and cream     History of Present Illness:  Patient is in today complaining of a rash having developed across his chest. this under his pectoral area and is bilateral. the rash is pruritic. He previously had some intertrigo treated with clotrimazole cream. He has been using this for 2 days at this point.  Past Medical History: Patient Active Problem List   Diagnosis Date Noted   At risk for obstructive sleep apnea 01/03/2022   Plantar fasciitis 01/03/2022   Pain due to onychomycosis of toenails of both feet 01/23/2021   Lymphadenopathy of head and neck 08/31/2020   Class 3 severe obesity due to excess calories with serious comorbidity and body mass index (BMI) of 40.0 to 44.9 in adult ([place]) 08/30/2020   Allergic rhinitis 05/30/2020   Erectile dysfunction 05/30/2020   Eustachian tube dysfunction, left 10/20/2019   Seborrheic keratosis 04/15/2017   PVC's (premature ventricular contractions) 09/03/2014   Essential hypertension 11/03/2013   Type 2 diabetes mellitus ([place]) 08/03/2013   Hyperlipidemia 08/03/2013   Low serum testosterone level 08/03/2013   Past Surgical History:  Procedure Laterality Date   COLONOSCOPY  10 years ago   at [facility] "normal" exam   NASAL SINUS SURGERY  2010   UPPER GASTROINTESTINAL ENDOSCOPY  10 years ago   WISDOM TOOTH EXTRACTION     Family History  Problem Relation Age of Onset   Aneurysm Mother 59       Deceased   Healthy Father        Living   Prostate cancer Paternal Uncle    Healthy Daughter        x3   Colon cancer Neg Hx    Esophageal cancer Neg Hx    Rectal cancer Neg Hx     Stomach cancer Neg Hx    Outpatient Medications Prior to Visit  Medication Sig Dispense Refill   acetaminophen (TYLENOL) 325 MG tablet Take 2 tablets (650 mg total) by mouth every 6 (six) hours as needed for mild pain or headache (fever >/= 101).     amLODipine (NORVASC) 5 MG tablet TAKE 1 TABLET (5 MG TOTAL) BY MOUTH DAILY. 90 tablet 2   aspirin EC 81 MG tablet Take 1 tablet by mouth daily.     atorvastatin (LIPITOR) 40 MG tablet TAKE 1.5 TABLETS BY MOUTH DAILY. 135 tablet 3   Blood Glucose Monitoring Suppl ([place]) w/Device KIT 1 Units by Does not apply route daily. Dx:E11.9 1 kit 0   fluticasone (FLONASE) 50 MCG/ACT nasal spray Place 2 sprays into both nostrils daily as needed for allergies or rhinitis. 16 g 6   glucose blood test strip Use as instructed daily Dx: E11.9 100 each 3   ibuprofen (ADVIL) 600 MG tablet Take 1 tablet (600 mg total) by mouth every 8 (eight) hours as needed. 90 tablet 3   JANUVIA 100 MG tablet TAKE ONE TABLET BY MOUTH EVERY DAY 90 tablet 3   metFORMIN (GLUCOPHAGE) 1000 MG tablet Take 1 tablet (1,000 mg total) by mouth 2 (two) times daily with a meal. 180 tablet 3  multivitamin (ONE-A-DAY MEN'S) TABS tablet Take 1 tablet by mouth daily.     OneTouch Delica Lancets 99991111 MISC 1 Stick by Does not apply route daily. Dx:E11.9 100 each 3   pioglitazone (ACTOS) 45 MG tablet Take 1 tablet (45 mg total) by mouth daily. 90 tablet 3   Semaglutide,0.25 or 0.5MG/DOS, (OZEMPIC, 0.25 OR 0.5 MG/DOSE,) 2 MG/3ML SOPN Inject 0.25 mg into the skin once a week. 3 mL 0   vitamin C (VITAMIN C) 500 MG tablet Take 1 tablet (500 mg total) by mouth daily. Please take for 2 weeks     clotrimazole (LOTRIMIN) 1 % cream APPLY TO AFFECTED AREA TWICE A DAY 30 g 0   meloxicam (MOBIC) 15 MG tablet TAKE 1 TABLET (15 MG TOTAL) BY MOUTH DAILY. (Patient not taking: Reported on 04/13/2022) 30 tablet 0   No facility-administered medications prior to visit.   Allergies  Allergen  Reactions   Avelox [Moxifloxacin Hcl In Nacl] Hives and Itching   Farxiga [Dapagliflozin] Hives   Doxycycline Rash   Objective:   Today's Vitals   04/13/22 1523  BP: 132/78  Pulse: 83  Temp: 98.7 F (37.1 C)  TempSrc: Temporal  SpO2: 97%  Weight: (!) 310 lb (140.6 kg)  Height: 5' 11"$  (1.803 m)   Body mass index is 43.24 kg/m.   General: Well developed, well nourished. No acute distress. Skin: Warm and dry. There are some confluent areas of maculopapular rash along the arcs under the pectoral   area bilaterally. Psych: Alert and oriented. Normal mood and affect.  Health Maintenance Due  Topic Date Due   Zoster Vaccines- Shingrix (1 of 2) Never done     Assessment & Plan:   Problem List Items Addressed This Visit   None Visit Diagnoses     Intertrigo    -  Primary   The rash still appears to be intertrigo I will try switching him to Lotrisone bid. He does need to give this enough time to resolve.   Relevant Medications   clotrimazole-betamethasone (LOTRISONE) cream       Return in about 4 weeks (around 05/11/2022) for Reassessment of diabetes.   Haydee Salter, MD

## 2022-05-06 ENCOUNTER — Other Ambulatory Visit: Payer: Self-pay | Admitting: Family Medicine

## 2022-05-06 DIAGNOSIS — E119 Type 2 diabetes mellitus without complications: Secondary | ICD-10-CM

## 2022-05-07 NOTE — Telephone Encounter (Signed)
Need to call to schedule a f/u appt since starting on 04/08/22. Dm/cma

## 2022-05-17 ENCOUNTER — Telehealth: Payer: Self-pay

## 2022-05-17 NOTE — Telephone Encounter (Signed)
Patient assistance forms filled out and place in mail to [employer], [place], [place], [place], [place].  Copy place into chart for future reference. Dm/cma

## 2022-05-18 ENCOUNTER — Other Ambulatory Visit: Payer: Self-pay

## 2022-05-18 ENCOUNTER — Ambulatory Visit (INDEPENDENT_AMBULATORY_CARE_PROVIDER_SITE_OTHER): Payer: Commercial Managed Care - PPO | Admitting: Family Medicine

## 2022-05-18 ENCOUNTER — Emergency Department (HOSPITAL_BASED_OUTPATIENT_CLINIC_OR_DEPARTMENT_OTHER): Payer: Commercial Managed Care - PPO

## 2022-05-18 ENCOUNTER — Emergency Department (HOSPITAL_BASED_OUTPATIENT_CLINIC_OR_DEPARTMENT_OTHER)
Admission: EM | Admit: 2022-05-18 | Discharge: 2022-05-18 | Disposition: A | Discharge status: Home / Self Care | Payer: Commercial Managed Care - PPO | Attending: Emergency Medicine | Admitting: Emergency Medicine

## 2022-05-18 ENCOUNTER — Encounter: Payer: Self-pay | Admitting: Family Medicine

## 2022-05-18 ENCOUNTER — Encounter (HOSPITAL_BASED_OUTPATIENT_CLINIC_OR_DEPARTMENT_OTHER): Payer: Self-pay

## 2022-05-18 VITALS — BP 124/72 | HR 110 | Temp 98.7°F | Ht 71.0 in | Wt 298.6 lb

## 2022-05-18 DIAGNOSIS — Z6841 Body Mass Index (BMI) 40.0 and over, adult: Secondary | ICD-10-CM

## 2022-05-18 DIAGNOSIS — Z7982 Long term (current) use of aspirin: Secondary | ICD-10-CM | POA: Insufficient documentation

## 2022-05-18 DIAGNOSIS — E119 Type 2 diabetes mellitus without complications: Secondary | ICD-10-CM

## 2022-05-18 DIAGNOSIS — Z7984 Long term (current) use of oral hypoglycemic drugs: Secondary | ICD-10-CM | POA: Diagnosis not present

## 2022-05-18 DIAGNOSIS — J020 Streptococcal pharyngitis: Secondary | ICD-10-CM

## 2022-05-18 DIAGNOSIS — Z20822 Contact with and (suspected) exposure to covid-19: Secondary | ICD-10-CM | POA: Insufficient documentation

## 2022-05-18 DIAGNOSIS — J029 Acute pharyngitis, unspecified: Secondary | ICD-10-CM | POA: Diagnosis present

## 2022-05-18 DIAGNOSIS — I10 Essential (primary) hypertension: Secondary | ICD-10-CM | POA: Diagnosis not present

## 2022-05-18 LAB — CBC WITH DIFFERENTIAL/PLATELET
Abs Immature Granulocytes: 0.07 10*3/uL (ref 0.00–0.07)
Basophils Absolute: 0.1 10*3/uL (ref 0.0–0.1)
Basophils Relative: 0 %
Eosinophils Absolute: 0.1 10*3/uL (ref 0.0–0.5)
Eosinophils Relative: 0 %
HCT: 38.6 % — ABNORMAL LOW (ref 39.0–52.0)
Hemoglobin: 13.2 g/dL (ref 13.0–17.0)
Immature Granulocytes: 0 %
Lymphocytes Relative: 10 %
Lymphs Abs: 1.7 10*3/uL (ref 0.7–4.0)
MCH: 27 pg (ref 26.0–34.0)
MCHC: 34.2 g/dL (ref 30.0–36.0)
MCV: 78.9 fL — ABNORMAL LOW (ref 80.0–100.0)
Monocytes Absolute: 2.1 10*3/uL — ABNORMAL HIGH (ref 0.1–1.0)
Monocytes Relative: 12 %
Neutro Abs: 12.9 10*3/uL — ABNORMAL HIGH (ref 1.7–7.7)
Neutrophils Relative %: 78 %
Platelets: 270 10*3/uL (ref 150–400)
RBC: 4.89 MIL/uL (ref 4.22–5.81)
RDW: 12.7 % (ref 11.5–15.5)
WBC: 16.9 10*3/uL — ABNORMAL HIGH (ref 4.0–10.5)
nRBC: 0 % (ref 0.0–0.2)

## 2022-05-18 LAB — BASIC METABOLIC PANEL
Anion gap: 9 (ref 5–15)
BUN: 11 mg/dL (ref 6–20)
CO2: 23 mmol/L (ref 22–32)
Calcium: 8.3 mg/dL — ABNORMAL LOW (ref 8.9–10.3)
Chloride: 99 mmol/L (ref 98–111)
Creatinine, Ser: 1.13 mg/dL (ref 0.61–1.24)
GFR, Estimated: >60 mL/min (ref >60)
Glucose, Bld: 221 mg/dL — ABNORMAL HIGH (ref 70–99)
Potassium: 3.4 mmol/L — ABNORMAL LOW (ref 3.5–5.1)
Sodium: 131 mmol/L — ABNORMAL LOW (ref 135–145)

## 2022-05-18 LAB — TROPONIN I (HIGH SENSITIVITY)
Troponin I (High Sensitivity): 4 ng/L (ref <18)
Troponin I (High Sensitivity): 5 ng/L (ref <18)

## 2022-05-18 LAB — RESP PANEL BY RT-PCR (RSV, FLU A&B, COVID)  RVPGX2
Influenza A by PCR: NEGATIVE
Influenza B by PCR: NEGATIVE
Resp Syncytial Virus by PCR: NEGATIVE
SARS Coronavirus 2 by RT PCR: NEGATIVE

## 2022-05-18 LAB — HEMOGLOBIN A1C: Hgb A1c MFr Bld: 10.1 % — ABNORMAL HIGH (ref 4.6–6.5)

## 2022-05-18 LAB — GLUCOSE, RANDOM: Glucose, Bld: 209 mg/dL — ABNORMAL HIGH (ref 70–99)

## 2022-05-18 LAB — GROUP A STREP BY PCR: Group A Strep by PCR: DETECTED — AB

## 2022-05-18 MED ORDER — SEMAGLUTIDE (1 MG/DOSE) 4 MG/3ML ~~LOC~~ SOPN: 1.0000 mg | PEN_INJECTOR | SUBCUTANEOUS | 2 refills | Status: DC

## 2022-05-18 MED ORDER — PENICILLIN G BENZATHINE 1200000 UNIT/2ML IM SUSY
1.2000 10*6.[IU] | PREFILLED_SYRINGE | Freq: Once | INTRAMUSCULAR | Status: AC
  Administered 2022-05-18: 1.2 10*6.[IU] via INTRAMUSCULAR
  Filled 2022-05-18: qty 2

## 2022-05-18 MED ORDER — SODIUM CHLORIDE 0.9 % IV BOLUS
500.0000 mL | Freq: Once | INTRAVENOUS | Status: AC
  Administered 2022-05-18: 500 mL via INTRAVENOUS

## 2022-05-18 MED ORDER — METFORMIN HCL 1000 MG PO TABS: 1000.0000 mg | ORAL_TABLET | Freq: Two times a day (BID) | ORAL | 3 refills | Status: DC

## 2022-05-18 MED ORDER — AMLODIPINE BESYLATE 5 MG PO TABS: 5.0000 mg | ORAL_TABLET | Freq: Every day | ORAL | 2 refills | Status: DC

## 2022-05-18 MED ORDER — OZEMPIC (0.25 OR 0.5 MG/DOSE) 2 MG/3ML ~~LOC~~ SOPN: 0.5000 mg | PEN_INJECTOR | SUBCUTANEOUS | 0 refills | Status: DC

## 2022-05-18 NOTE — ED Provider Notes (Signed)
[facility] Provider Note   CSN: [id number] Arrival date & time: 05/18/22  0107     History  Chief Complaint  Patient presents with   Irregular Heart Beat    Edwin [name] is a 57 y.o. male.  The history is provided by the patient.  Sore Throat This is a new problem. The current episode started more than 2 days ago. The problem occurs constantly. The problem has not changed since onset.Pertinent negatives include no chest pain, no abdominal pain, no headaches and no shortness of breath. Nothing aggravates the symptoms. Nothing relieves the symptoms. He has tried acetaminophen for the symptoms. The treatment provided no relief.  Patient with DM with sore throat and HR above 100 and was told to come in by call in line.       Home Medications Prior to Admission medications   Medication Sig Start Date End Date Taking? Authorizing Provider  acetaminophen (TYLENOL) 325 MG tablet Take 2 tablets (650 mg total) by mouth every 6 (six) hours as needed for mild pain or headache (fever >/= 101). 08/21/18   Elgergawy, Silver Huguenin, MD  amLODipine (NORVASC) 5 MG tablet TAKE 1 TABLET (5 MG TOTAL) BY MOUTH DAILY. 11/24/21   Haydee Salter, MD  aspirin EC 81 MG tablet Take 1 tablet by mouth daily.    [provider]  atorvastatin (LIPITOR) 40 MG tablet TAKE 1.5 TABLETS BY MOUTH DAILY. 03/13/22   Haydee Salter, MD  Blood Glucose Monitoring Suppl (ONETOUCH VERIO FLEX SYSTEM) w/Device KIT 1 Units by Does not apply route daily. Dx:E11.9 03/08/21   Haydee Salter, MD  clotrimazole-betamethasone (LOTRISONE) cream Apply 1 Application topically daily. 04/13/22   Haydee Salter, MD  fluticasone Asencion Islam) 50 MCG/ACT nasal spray Place 2 sprays into both nostrils daily as needed for allergies or rhinitis. 11/16/20   Haydee Salter, MD  glucose blood test strip Use as instructed daily Dx: E11.9 09/19/18   Brunetta Jeans, PA-C  ibuprofen (ADVIL) 600 MG tablet  Take 1 tablet (600 mg total) by mouth every 8 (eight) hours as needed. 06/28/21   Haydee Salter, MD  JANUVIA 100 MG tablet TAKE ONE TABLET BY MOUTH EVERY DAY 03/22/22   Haydee Salter, MD  meloxicam (MOBIC) 15 MG tablet TAKE 1 TABLET (15 MG TOTAL) BY MOUTH DAILY. Patient not taking: Reported on 04/13/2022 04/10/22   Tyson Dense T, DPM  metFORMIN (GLUCOPHAGE) 1000 MG tablet Take 1 tablet (1,000 mg total) by mouth 2 (two) times daily with a meal. 06/28/21   Haydee Salter, MD  multivitamin (ONE-A-DAY MEN'S) TABS tablet Take 1 tablet by mouth daily.    [provider]  OneTouch Delica Lancets 99991111 MISC 1 Stick by Does not apply route daily. Dx:E11.9 03/08/21   Haydee Salter, MD  pioglitazone (ACTOS) 45 MG tablet Take 1 tablet (45 mg total) by mouth daily. 01/03/22   Haydee Salter, MD  Semaglutide,0.25 or 0.5MG /DOS, (OZEMPIC, 0.25 OR 0.5 MG/DOSE,) 2 MG/3ML SOPN INJECT 0.25MG  INTO THE SKIN ONE TIME PER WEEK 05/07/22   Haydee Salter, MD  vitamin C (VITAMIN C) 500 MG tablet Take 1 tablet (500 mg total) by mouth daily. Please take for 2 weeks 08/22/18   Elgergawy, Silver Huguenin, MD      Allergies    Avelox [moxifloxacin hcl in nacl], Farxiga [dapagliflozin], and Doxycycline    Review of Systems   Review of Systems  Constitutional:  Negative  for fever.  HENT:  Positive for sore throat. Negative for trouble swallowing and voice change.   Respiratory:  Negative for shortness of breath.   Cardiovascular:  Negative for chest pain.  Gastrointestinal:  Negative for abdominal pain.  Neurological:  Negative for headaches.  All other systems reviewed and are negative.   Physical Exam Updated Vital Signs BP 134/87 (BP Location: Right Arm)   Pulse (!) 106   Temp 99.2 F (37.3 C) (Oral)   Resp 18   Ht 5\' 11"  (1.803 m)   Wt (!) 140.6 kg   SpO2 97%   BMI 43.24 kg/m  Physical Exam Vitals and nursing note reviewed.  Constitutional:      General: He is not in acute distress.    Appearance: Normal  appearance. He is well-developed. He is not diaphoretic.  HENT:     Head: Normocephalic and atraumatic.     Nose: Nose normal.     Mouth/Throat:     Mouth: Mucous membranes are moist.     Pharynx: Oropharynx is clear. No oropharyngeal exudate.  Eyes:     Conjunctiva/sclera: Conjunctivae normal.     Pupils: Pupils are equal, round, and reactive to light.  Cardiovascular:     Rate and Rhythm: Regular rhythm. Tachycardia present.     Pulses: Normal pulses.     Heart sounds: Normal heart sounds.  Pulmonary:     Effort: Pulmonary effort is normal.     Breath sounds: Normal breath sounds. No wheezing or rales.  Abdominal:     General: Bowel sounds are normal.     Palpations: Abdomen is soft.     Tenderness: There is no abdominal tenderness. There is no guarding or rebound.  Musculoskeletal:        General: Normal range of motion.     Cervical back: Normal range of motion and neck supple.  Lymphadenopathy:     Cervical: No cervical adenopathy.  Skin:    General: Skin is warm and dry.     Capillary Refill: Capillary refill takes less than 2 seconds.  Neurological:     General: No focal deficit present.     Mental Status: He is alert and oriented to person, place, and time.  Psychiatric:        Mood and Affect: Mood normal.        Behavior: Behavior normal.     ED Results / Procedures / Treatments   Labs (all labs ordered are listed, but only abnormal results are displayed) Results for orders placed or performed during the hospital encounter of 05/18/22  Resp panel by RT-PCR (RSV, Flu A&B, Covid) Nasopharyngeal Swab   Specimen: Nasopharyngeal Swab; Nasal Swab  Result Value Ref Range   SARS Coronavirus 2 by RT PCR NEGATIVE NEGATIVE   Influenza A by PCR NEGATIVE NEGATIVE   Influenza B by PCR NEGATIVE NEGATIVE   Resp Syncytial Virus by PCR NEGATIVE NEGATIVE  Group A Strep by PCR   Specimen: Throat; Sterile Swab  Result Value Ref Range   Group A Strep by PCR DETECTED (A) NOT  DETECTED  CBC with Differential  Result Value Ref Range   WBC 16.9 (H) 4.0 - 10.5 K/uL   RBC 4.89 4.22 - 5.81 MIL/uL   Hemoglobin 13.2 13.0 - 17.0 g/dL   HCT 38.6 (L) 39.0 - 52.0 %   MCV 78.9 (L) 80.0 - 100.0 fL   MCH 27.0 26.0 - 34.0 pg   MCHC 34.2 30.0 - 36.0 g/dL   RDW  12.7 11.5 - 15.5 %   Platelets 270 150 - 400 K/uL   nRBC 0.0 0.0 - 0.2 %   Neutrophils Relative % 78 %   Neutro Abs 12.9 (H) 1.7 - 7.7 K/uL   Lymphocytes Relative 10 %   Lymphs Abs 1.7 0.7 - 4.0 K/uL   Monocytes Relative 12 %   Monocytes Absolute 2.1 (H) 0.1 - 1.0 K/uL   Eosinophils Relative 0 %   Eosinophils Absolute 0.1 0.0 - 0.5 K/uL   Basophils Relative 0 %   Basophils Absolute 0.1 0.0 - 0.1 K/uL   Immature Granulocytes 0 %   Abs Immature Granulocytes 0.07 0.00 - 0.07 K/uL  Basic metabolic panel  Result Value Ref Range   Sodium 131 (L) 135 - 145 mmol/L   Potassium 3.4 (L) 3.5 - 5.1 mmol/L   Chloride 99 98 - 111 mmol/L   CO2 23 22 - 32 mmol/L   Glucose, Bld 221 (H) 70 - 99 mg/dL   BUN 11 6 - 20 mg/dL   Creatinine, Ser 1.13 0.61 - 1.24 mg/dL   Calcium 8.3 (L) 8.9 - 10.3 mg/dL   GFR, Estimated >60 >60 mL/min   Anion gap 9 5 - 15  Troponin I (High Sensitivity)  Result Value Ref Range   Troponin I (High Sensitivity) 4 <18 ng/L  Troponin I (High Sensitivity)  Result Value Ref Range   Troponin I (High Sensitivity) 5 <18 ng/L   DG Chest Portable 1 View  Result Date: 05/18/2022 CLINICAL DATA:  Pain etc. Pt states irregular heart beat and sore throat since Tuesday. EXAM: PORTABLE CHEST 1 VIEW COMPARISON:  Chest x-ray 08/16/2018 FINDINGS: The heart and mediastinal contours are within normal limits. No focal consolidation. No pulmonary edema. No pleural effusion. No pneumothorax. No acute osseous abnormality. IMPRESSION: No active disease. Electronically Signed   By: Iven Finn M.D.   On: 05/18/2022 03:05     EKG  EKG Interpretation  Date/Time:  Friday May 18 2022 01:28:26 EDT Ventricular Rate:   108 PR Interval:  165 QRS Duration: 83 QT Interval:  324 QTC Calculation: 435 R Axis:   23 Text Interpretation: Sinus tachycardia Confirmed by Dory Horn) on 05/18/2022 4:56:13 AM         Radiology DG Chest Portable 1 View  Result Date: 05/18/2022 CLINICAL DATA:  Pain etc. Pt states irregular heart beat and sore throat since Tuesday. EXAM: PORTABLE CHEST 1 VIEW COMPARISON:  Chest x-ray 08/16/2018 FINDINGS: The heart and mediastinal contours are within normal limits. No focal consolidation. No pulmonary edema. No pleural effusion. No pneumothorax. No acute osseous abnormality. IMPRESSION: No active disease. Electronically Signed   By: Iven Finn M.D.   On: 05/18/2022 03:05    Procedures Procedures    Medications Ordered in ED Medications  penicillin g benzathine (BICILLIN LA) 1200000 UNIT/2ML injection 1.2 Million Units (has no administration in time range)  sodium chloride 0.9 % bolus 500 mL (0 mLs Intravenous Stopped 05/18/22 0252)    ED Course/ Medical Decision Making/ A&P                             Medical Decision Making Sore throat and HR above 100   Amount and/or Complexity of Data Reviewed Independent Historian: spouse    Details: See above External Data Reviewed: notes.    Details: Previous notes reviewed  Labs: ordered.    Details: All labs reviewed:  negative troponin x 2  4/5/ .  Negative covid and flu.  Potassium slight low 3.4, normal creatinine 1.13, normal glucose 221.  White count elevated 16.9, strep test positive  Radiology: ordered and independent interpretation performed.    Details: Negative CXR  ECG/medicine tests: ordered and independent interpretation performed. Decision-making details documented in ED Course.  Risk Prescription drug management. Risk Details: IVF given in the ED with normalization of HR strep positive, treated in the ED with PCN.  Well appearing, stable for discharge.  Strict return      Final Clinical  Impression(s) / ED Diagnoses Final diagnoses:  Strep throat  Return for intractable cough, coughing up blood, fevers > 100.4 unrelieved by medication, shortness of breath, intractable vomiting, chest pain, shortness of breath, weakness, numbness, changes in speech, facial asymmetry, abdominal pain, passing out, Inability to tolerate liquids or food, cough, altered mental status or any concerns. No signs of systemic illness or infection. The patient is nontoxic-appearing on exam and vital signs are within normal limits.  I have reviewed the triage vital signs and the nursing notes. Pertinent labs & imaging results that were available during my care of the patient were reviewed by me and considered in my medical decision making (see chart for details). After history, exam, and medical workup I feel the patient has been appropriately medically screened and is safe for discharge home. Pertinent diagnoses were discussed with the patient. Patient was given return precautions.  Rx / DC Orders ED Discharge Orders     None         Mykell Rawl, MD 05/18/22 AN:[id number]

## 2022-05-18 NOTE — Assessment & Plan Note (Signed)
Maximum weight: 310 lbs (04/2022) Current weight: 298 lbs Weight change since last visit: -12 lbs Total weight loss: 12 lbs (3.8%)  We will continue to monitor weight loss on semaglutide.

## 2022-05-18 NOTE — ED Triage Notes (Signed)
Pt to ED by POV from home c/o irregular HR and a sore throat which began on Tuesday, Pt denies CP. Arrives A+O, VSS, NADN.

## 2022-05-18 NOTE — Progress Notes (Signed)
[facility] [address] [place] [address] Dept: [phone] Dept Fax: [phone]  Chronic Care Office Visit  Subjective:    Patient ID: Edwin [name], male    DOB: [birth year], 57 y.o..   MRN: [id number]  Chief Complaint  Patient presents with   Medical Management of Chronic Issues    F/u meds.    History of Present Illness:  Patient is in today for reassessment of chronic medical issues.  Edwin [name] has a history of Type 2 diabetes. He is managed on metformin 1000 mg bid, sitagliptin (Januvia) 100 mg daily, and pioglitazone 30 mg daily. Since his last visit, we initiated semaglutide (Ozempic) 0.25 mg weekly. He is pleased to see that he is having some weight loss at this point. He is tolerating his medicine without problems.   Edwin [name] has a history of hypertension. He is managed on amlodipine 5 mg daily.    Edwin [name] has a history of hyperlipidemia. He is managed on atorvastatin 40 mg daily.  Past Medical History: Patient Active Problem List   Diagnosis Date Noted   At risk for obstructive sleep apnea 01/03/2022   Plantar fasciitis 01/03/2022   Pain due to onychomycosis of toenails of both feet 01/23/2021   Lymphadenopathy of head and neck 08/31/2020   Class 3 severe obesity due to excess calories with serious comorbidity and body mass index (BMI) of 40.0 to 44.9 in adult ([place]) 08/30/2020   Allergic rhinitis 05/30/2020   Erectile dysfunction 05/30/2020   Eustachian tube dysfunction, left 10/20/2019   Seborrheic keratosis 04/15/2017   PVC's (premature ventricular contractions) 09/03/2014   Essential hypertension 11/03/2013   Type 2 diabetes mellitus ([place]) 08/03/2013   Hyperlipidemia 08/03/2013   Low serum testosterone level 08/03/2013   Past Surgical History:  Procedure Laterality Date   COLONOSCOPY  10 years ago   at [facility] "normal" exam   NASAL SINUS SURGERY  2010   UPPER GASTROINTESTINAL  ENDOSCOPY  10 years ago   WISDOM TOOTH EXTRACTION     Family History  Problem Relation Age of Onset   Aneurysm Mother 43       Deceased   Healthy Father        Living   Prostate cancer Paternal Uncle    Healthy Daughter        x3   Colon cancer Neg Hx    Esophageal cancer Neg Hx    Rectal cancer Neg Hx    Stomach cancer Neg Hx    Outpatient Medications Prior to Visit  Medication Sig Dispense Refill   acetaminophen (TYLENOL) 325 MG tablet Take 2 tablets (650 mg total) by mouth every 6 (six) hours as needed for mild pain or headache (fever >/= 101).     aspirin EC 81 MG tablet Take 1 tablet by mouth daily.     atorvastatin (LIPITOR) 40 MG tablet TAKE 1.5 TABLETS BY MOUTH DAILY. 135 tablet 3   Blood Glucose Monitoring Suppl ([place]) w/Device KIT 1 Units by Does not apply route daily. Dx:E11.9 1 kit 0   clotrimazole-betamethasone (LOTRISONE) cream Apply 1 Application topically daily. 30 g 0   fluticasone (FLONASE) 50 MCG/ACT nasal spray Place 2 sprays into both nostrils daily as needed for allergies or rhinitis. 16 g 6   glucose blood test strip Use as instructed daily Dx: E11.9 100 each 3   ibuprofen (ADVIL) 600 MG tablet Take 1 tablet (600 mg total) by mouth every 8 (eight) hours  as needed. 90 tablet 3   meloxicam (MOBIC) 15 MG tablet TAKE 1 TABLET (15 MG TOTAL) BY MOUTH DAILY. 30 tablet 0   multivitamin (ONE-A-DAY MEN'S) TABS tablet Take 1 tablet by mouth daily.     OneTouch Delica Lancets 99991111 MISC 1 Stick by Does not apply route daily. Dx:E11.9 100 each 3   pioglitazone (ACTOS) 45 MG tablet Take 1 tablet (45 mg total) by mouth daily. 90 tablet 3   Semaglutide,0.25 or 0.5MG /DOS, (OZEMPIC, 0.25 OR 0.5 MG/DOSE,) 2 MG/3ML SOPN INJECT 0.25MG  INTO THE SKIN ONE TIME PER WEEK 3 mL 0   vitamin C (VITAMIN C) 500 MG tablet Take 1 tablet (500 mg total) by mouth daily. Please take for 2 weeks     amLODipine (NORVASC) 5 MG tablet TAKE 1 TABLET (5 MG TOTAL) BY MOUTH DAILY. 90  tablet 2   JANUVIA 100 MG tablet TAKE ONE TABLET BY MOUTH EVERY DAY 90 tablet 3   metFORMIN (GLUCOPHAGE) 1000 MG tablet Take 1 tablet (1,000 mg total) by mouth 2 (two) times daily with a meal. 180 tablet 3   No facility-administered medications prior to visit.   Allergies  Allergen Reactions   Avelox [Moxifloxacin Hcl In Nacl] Hives and Itching   Farxiga [Dapagliflozin] Hives   Doxycycline Rash   Objective:   Today's Vitals   05/18/22 1059  BP: 124/72  Pulse: (!) 110  Temp: 98.7 F (37.1 C)  TempSrc: Temporal  SpO2: 96%  Weight: 298 lb 9.6 oz (135.4 kg)  Height: 5\' 11"  (1.803 m)   Body mass index is 41.65 kg/m.   General: Well developed, well nourished. No acute distress. Psych: Alert and oriented. Normal mood and affect.  Health Maintenance Due  Topic Date Due   Zoster Vaccines- Shingrix (1 of 2) Never done   Lab Results Last hemoglobin A1c Lab Results  Component Value Date   HGBA1C 8.4 (H) 01/03/2022   Assessment & Plan:   Problem List Items Addressed This Visit       Cardiovascular and Mediastinum   Essential hypertension - Primary    Blood pressure is in good control. Continue amlodipine 5 mg daily.      Relevant Medications   amLODipine (NORVASC) 5 MG tablet     Endocrine   Type 2 diabetes mellitus ([place])    I recommend Edwin [name] stop his sitagliptin, as there is no added benefit to being on a DPP4 and a GLP1-RA. I will check his A1c today, but recognize that he only recently started the semaglutide 3 weeks ago.      Relevant Medications   metFORMIN (GLUCOPHAGE) 1000 MG tablet   Semaglutide,0.25 or 0.5MG /DOS, (OZEMPIC, 0.25 OR 0.5 MG/DOSE,) 2 MG/3ML SOPN   Semaglutide, 1 MG/DOSE, 4 MG/3ML SOPN   Other Relevant Orders   Glucose, random   Hemoglobin A1c     Other   Class 3 severe obesity due to excess calories with serious comorbidity and body mass index (BMI) of 40.0 to 44.9 in adult (HCC)    Maximum weight: 310 lbs (04/2022) Current weight:  298 lbs Weight change since last visit: -12 lbs Total weight loss: 12 lbs (3.8%)  We will continue to monitor weight loss on semaglutide.      Relevant Medications   metFORMIN (GLUCOPHAGE) 1000 MG tablet   Semaglutide,0.25 or 0.5MG /DOS, (OZEMPIC, 0.25 OR 0.5 MG/DOSE,) 2 MG/3ML SOPN   Semaglutide, 1 MG/DOSE, 4 MG/3ML SOPN    Return in about 3 months (around 08/18/2022) for Reassessment.  Haydee Salter, MD

## 2022-05-18 NOTE — Assessment & Plan Note (Signed)
I recommend Edwin [name] stop his sitagliptin, as there is no added benefit to being on a DPP4 and a GLP1-RA. I will check his A1c today, but recognize that he only recently started the semaglutide 3 weeks ago.

## 2022-05-18 NOTE — Assessment & Plan Note (Signed)
Blood pressure is in good control. Continue amlodipine 5 mg daily. 

## 2022-05-21 ENCOUNTER — Ambulatory Visit: Payer: Commercial Managed Care - PPO | Admitting: Family Medicine

## 2022-06-03 ENCOUNTER — Other Ambulatory Visit: Payer: Self-pay | Admitting: Family Medicine

## 2022-06-03 DIAGNOSIS — E119 Type 2 diabetes mellitus without complications: Secondary | ICD-10-CM

## 2022-06-04 ENCOUNTER — Other Ambulatory Visit: Payer: Self-pay | Admitting: Family Medicine

## 2022-06-04 DIAGNOSIS — E119 Type 2 diabetes mellitus without complications: Secondary | ICD-10-CM

## 2022-06-04 MED ORDER — SEMAGLUTIDE (1 MG/DOSE) 4 MG/3ML ~~LOC~~ SOPN: 1.0000 mg | PEN_INJECTOR | SUBCUTANEOUS | 2 refills | Status: DC

## 2022-06-04 MED ORDER — OZEMPIC (0.25 OR 0.5 MG/DOSE) 2 MG/3ML ~~LOC~~ SOPN: 0.5000 mg | PEN_INJECTOR | SUBCUTANEOUS | 0 refills | Status: DC

## 2022-06-04 MED ORDER — METFORMIN HCL 1000 MG PO TABS: 1000.0000 mg | ORAL_TABLET | Freq: Two times a day (BID) | ORAL | 3 refills | Status: DC

## 2022-06-04 NOTE — Telephone Encounter (Signed)
Med was sent to wrong pharmacy 3/15. Pt states he has never used [facility]. Please resend [facility]   [id number] [place], [place] Phone: [phone]  Fax: [phone]

## 2022-06-04 NOTE — Telephone Encounter (Signed)
[facility] Lft VM to advise. Dm/cma

## 2022-06-04 NOTE — Telephone Encounter (Signed)
Pt called and states med was sent to wrong pharmacy 3/15. He has never used Sonexus. Please resend to:  [id number] [place], [place] Phone: [phone]  Fax: [phone]

## 2022-07-03 ENCOUNTER — Telehealth: Payer: Self-pay | Admitting: Family Medicine

## 2022-07-03 DIAGNOSIS — L304 Erythema intertrigo: Secondary | ICD-10-CM

## 2022-07-03 MED ORDER — CLOTRIMAZOLE-BETAMETHASONE 1-0.05 % EX CREA: 1.0000 | TOPICAL_CREAM | Freq: Every day | CUTANEOUS | 0 refills | Status: DC

## 2022-07-03 NOTE — Addendum Note (Signed)
Addended by: Christy Sartorius on: 07/03/2022 01:29 PM   Modules accepted: Orders

## 2022-07-03 NOTE — Telephone Encounter (Signed)
Rx sent 

## 2022-07-03 NOTE — Telephone Encounter (Signed)
Prescription Request  07/03/2022  LOV: 05/18/2022  What is the name of the medication or equipment? clotrimazole-betamethasone (LOTRISONE) cream [[id number]]     Have you contacted your pharmacy to request a refill? No   Which pharmacy would you like this sent to?  [phone], [place] - [address] [place] [address] Phone: [phone] Fax: [phone]    Patient notified that their request is being sent to the clinical staff for review and that they should receive a response within 2 business days.   Please advise at Mobile [phone] (mobile)

## 2022-07-03 NOTE — Telephone Encounter (Signed)
Pt has a rash on his buttocks area and wants to know if he should use the cream he used last time for a rash. clotrimazole-betamethasone (LOTRISONE) cream [[id number]]    The rash was on his chest before. Please advise pt at [phone] [facility])

## 2022-08-23 ENCOUNTER — Ambulatory Visit: Payer: Commercial Managed Care - PPO | Admitting: Family Medicine

## 2022-08-23 ENCOUNTER — Encounter: Payer: Self-pay | Admitting: Family Medicine

## 2022-08-23 VITALS — BP 124/80 | HR 103 | Temp 98.0°F | Ht 71.0 in | Wt 293.6 lb

## 2022-08-23 DIAGNOSIS — E119 Type 2 diabetes mellitus without complications: Secondary | ICD-10-CM

## 2022-08-23 DIAGNOSIS — I10 Essential (primary) hypertension: Secondary | ICD-10-CM | POA: Diagnosis not present

## 2022-08-23 DIAGNOSIS — Z6841 Body Mass Index (BMI) 40.0 and over, adult: Secondary | ICD-10-CM

## 2022-08-23 DIAGNOSIS — E66813 Obesity, class 3: Secondary | ICD-10-CM

## 2022-08-23 DIAGNOSIS — E782 Mixed hyperlipidemia: Secondary | ICD-10-CM | POA: Diagnosis not present

## 2022-08-23 DIAGNOSIS — Z7984 Long term (current) use of oral hypoglycemic drugs: Secondary | ICD-10-CM

## 2022-08-23 LAB — BASIC METABOLIC PANEL
BUN: 13 mg/dL (ref 6–23)
CO2: 25 mEq/L (ref 19–32)
Calcium: 9.6 mg/dL (ref 8.4–10.5)
Chloride: 104 mEq/L (ref 96–112)
Creatinine, Ser: 0.98 mg/dL (ref 0.40–1.50)
GFR: 85.99 mL/min (ref >60.00)
Glucose, Bld: 133 mg/dL — ABNORMAL HIGH (ref 70–99)
Potassium: 4.2 mEq/L (ref 3.5–5.1)
Sodium: 139 mEq/L (ref 135–145)

## 2022-08-23 LAB — URINALYSIS, ROUTINE W REFLEX MICROSCOPIC
Bilirubin Urine: NEGATIVE
Hgb urine dipstick: NEGATIVE
Ketones, ur: NEGATIVE
Leukocytes,Ua: NEGATIVE
Nitrite: NEGATIVE
RBC / HPF: NONE SEEN (ref 0–?)
Specific Gravity, Urine: 1.015 (ref 1.000–1.030)
Total Protein, Urine: NEGATIVE
Urine Glucose: NEGATIVE
Urobilinogen, UA: 0.2 (ref 0.0–1.0)
WBC, UA: NONE SEEN (ref 0–?)
pH: 6.5 (ref 5.0–8.0)

## 2022-08-23 LAB — LIPID PANEL
Cholesterol: 132 mg/dL (ref 0–200)
HDL: 38.8 mg/dL — ABNORMAL LOW (ref >39.00)
LDL Cholesterol: 76 mg/dL (ref 0–99)
NonHDL: 92.93
Total CHOL/HDL Ratio: 3
Triglycerides: 83 mg/dL (ref 0.0–149.0)
VLDL: 16.6 mg/dL (ref 0.0–40.0)

## 2022-08-23 LAB — HEMOGLOBIN A1C: Hgb A1c MFr Bld: 7.5 % — ABNORMAL HIGH (ref 4.6–6.5)

## 2022-08-23 NOTE — Assessment & Plan Note (Signed)
Maximum weight: 310 lbs (04/2022) Current weight: 293 lbs Weight change since last visit: - 5 lbs Total weight loss: 17 lbs (5.5%)  We will continue to monitor weight loss on semaglutide.

## 2022-08-23 NOTE — Assessment & Plan Note (Signed)
Blood pressure is in good control. Continue amlodipine 5 mg daily. 

## 2022-08-23 NOTE — Assessment & Plan Note (Signed)
I will check annual DM labs today. Continue metformin 1000 mg bid, sitagliptin (Januvia) 100 mg daily, pioglitazone 30 mg daily, and semaglutide (Ozempic) 1 mg weekly.

## 2022-08-23 NOTE — Progress Notes (Signed)
[facility] [address] [place] [address] Dept: [phone] Dept Fax: [phone]  Chronic Care Office Visit  Subjective:    Patient ID: Edwin [name], male    DOB: 1965/08/06, 57 y.o..   MRN: [id number]  Chief Complaint  Patient presents with   Diabetes    Follow up, concerns with Ozempic increase   History of Present Illness:  Patient is in today for reassessment of chronic medical issues.  Mr. Buzzell has a history of Type 2 diabetes. He is managed on metformin 1000 mg bid, sitagliptin (Januvia) 100 mg daily, pioglitazone 30 mg daily, and semaglutide (Ozempic) 1 mg weekly. He is tolerating his medicine without problems. He had some recent lower back pain, but this appears unrelated to his meds.   Mr. Economos has a history of hypertension. He is managed on amlodipine 5 mg daily.    Mr. Bussman has a history of hyperlipidemia. He is managed on atorvastatin 40 mg daily.  Past Medical History: Patient Active Problem List   Diagnosis Date Noted   At risk for obstructive sleep apnea 01/03/2022   Plantar fasciitis 01/03/2022   Pain due to onychomycosis of toenails of both feet 01/23/2021   Lymphadenopathy of head and neck 08/31/2020   Class 3 severe obesity due to excess calories with serious comorbidity and body mass index (BMI) of 40.0 to 44.9 in adult (HCC) 08/30/2020   Allergic rhinitis 05/30/2020   Erectile dysfunction 05/30/2020   Eustachian tube dysfunction, left 10/20/2019   Seborrheic keratosis 04/15/2017   PVC's (premature ventricular contractions) 09/03/2014   Essential hypertension 11/03/2013   Type 2 diabetes mellitus (HCC) 08/03/2013   Hyperlipidemia 08/03/2013   Low serum testosterone level 08/03/2013   Past Surgical History:  Procedure Laterality Date   COLONOSCOPY  10 years ago   at [facility] "normal" exam   NASAL SINUS SURGERY  2010   UPPER GASTROINTESTINAL ENDOSCOPY  10 years ago   WISDOM  TOOTH EXTRACTION     Family History  Problem Relation Age of Onset   Aneurysm Mother 66       Deceased   Healthy Father        Living   Prostate cancer Paternal Uncle    Healthy Daughter        x3   Colon cancer Neg Hx    Esophageal cancer Neg Hx    Rectal cancer Neg Hx    Stomach cancer Neg Hx    Outpatient Medications Prior to Visit  Medication Sig Dispense Refill   acetaminophen (TYLENOL) 325 MG tablet Take 2 tablets (650 mg total) by mouth every 6 (six) hours as needed for mild pain or headache (fever >/= 101).     amLODipine (NORVASC) 5 MG tablet Take 1 tablet (5 mg total) by mouth daily. 90 tablet 2   aspirin EC 81 MG tablet Take 1 tablet by mouth daily.     atorvastatin (LIPITOR) 40 MG tablet TAKE 1.5 TABLETS BY MOUTH DAILY. 135 tablet 3   Blood Glucose Monitoring Suppl (ONETOUCH VERIO FLEX SYSTEM) w/Device KIT 1 Units by Does not apply route daily. Dx:E11.9 1 kit 0   clotrimazole-betamethasone (LOTRISONE) cream Apply 1 Application topically daily. 30 g 0   fluticasone (FLONASE) 50 MCG/ACT nasal spray Place 2 sprays into both nostrils daily as needed for allergies or rhinitis. 16 g 6   glucose blood test strip Use as instructed daily Dx: E11.9 100 each 3   ibuprofen (ADVIL) 600 MG tablet Take  1 tablet (600 mg total) by mouth every 8 (eight) hours as needed. 90 tablet 3   meloxicam (MOBIC) 15 MG tablet TAKE 1 TABLET (15 MG TOTAL) BY MOUTH DAILY. 30 tablet 0   metFORMIN (GLUCOPHAGE) 1000 MG tablet Take 1 tablet (1,000 mg total) by mouth 2 (two) times daily with a meal. 180 tablet 3   multivitamin (ONE-A-DAY MEN'S) TABS tablet Take 1 tablet by mouth daily.     OneTouch Delica Lancets 33G MISC 1 Stick by Does not apply route daily. Dx:E11.9 100 each 3   pioglitazone (ACTOS) 45 MG tablet Take 1 tablet (45 mg total) by mouth daily. 90 tablet 3   Semaglutide, 1 MG/DOSE, 4 MG/3ML SOPN Inject 1 mg as directed once a week. To start after completing 28 days on the 0.5 mg weekly dose. 3 mL  2   vitamin C (VITAMIN C) 500 MG tablet Take 1 tablet (500 mg total) by mouth daily. Please take for 2 weeks     Semaglutide,0.25 or 0.5MG /DOS, (OZEMPIC, 0.25 OR 0.5 MG/DOSE,) 2 MG/3ML SOPN INJECT 0.25MG  INTO THE SKIN ONE TIME PER WEEK 3 mL 0   Semaglutide,0.25 or 0.5MG /DOS, (OZEMPIC, 0.25 OR 0.5 MG/DOSE,) 2 MG/3ML SOPN Inject 0.5 mg into the skin once a week. To start after completing 28 days on the 0.25 mg weekly dose. 3 mL 0   No facility-administered medications prior to visit.   Allergies  Allergen Reactions   Avelox [Moxifloxacin Hcl In Nacl] Hives and Itching   Farxiga [Dapagliflozin] Hives   Doxycycline Rash   Objective:   Today's Vitals   08/23/22 1045  BP: 124/80  Pulse: (!) 103  Temp: 98 F (36.7 C)  SpO2: 97%  Weight: 293 lb 9.6 oz (133.2 kg)  Height: 5\' 11"  (1.803 m)   Body mass index is 40.95 kg/m.   General: Well developed, well nourished. No acute distress. Psych: Alert and oriented. Normal mood and affect.  Health Maintenance Due  Topic Date Due   Zoster Vaccines- Shingrix (1 of 2) Never done   COVID-19 Vaccine (4 - 2023-24 season) 11/03/2021   Diabetic kidney evaluation - Urine ACR  06/29/2022   OPHTHALMOLOGY EXAM  08/22/2022     Assessment & Plan:   Problem List Items Addressed This Visit       Cardiovascular and Mediastinum   Essential hypertension - Primary    Blood pressure is in good control. Continue amlodipine 5 mg daily.        Endocrine   Type 2 diabetes mellitus (HCC)    I will check annual DM labs today. Continue metformin 1000 mg bid, sitagliptin (Januvia) 100 mg daily, pioglitazone 30 mg daily, and semaglutide (Ozempic) 1 mg weekly.      Relevant Orders   Microalbumin / creatinine urine ratio   Basic metabolic panel   Hemoglobin A1c   Urinalysis, Routine w reflex microscopic     Other   Hyperlipidemia   Relevant Orders   Lipid panel   Class 3 severe obesity due to excess calories with serious comorbidity and body mass  index (BMI) of 40.0 to 44.9 in adult (HCC)    Maximum weight: 310 lbs (04/2022) Current weight: 293 lbs Weight change since last visit: - 5 lbs Total weight loss: 17 lbs (5.5%)  We will continue to monitor weight loss on semaglutide.       Return in about 3 months (around 11/23/2022) for Reassessment.   Loyola Mast, MD

## 2022-08-24 LAB — MICROALBUMIN / CREATININE URINE RATIO
Creatinine,U: 136.8 mg/dL
Microalb Creat Ratio: 0.5 mg/g (ref 0.0–30.0)
Microalb, Ur: <0.7 mg/dL (ref 0.0–1.9)

## 2022-08-25 ENCOUNTER — Other Ambulatory Visit: Payer: Self-pay | Admitting: Family Medicine

## 2022-08-25 DIAGNOSIS — E119 Type 2 diabetes mellitus without complications: Secondary | ICD-10-CM

## 2022-09-30 ENCOUNTER — Other Ambulatory Visit: Payer: Self-pay | Admitting: Family Medicine

## 2022-09-30 DIAGNOSIS — E119 Type 2 diabetes mellitus without complications: Secondary | ICD-10-CM

## 2022-10-04 ENCOUNTER — Telehealth: Payer: Self-pay | Admitting: Family Medicine

## 2022-10-04 DIAGNOSIS — I10 Essential (primary) hypertension: Secondary | ICD-10-CM

## 2022-10-04 MED ORDER — AMLODIPINE BESYLATE 5 MG PO TABS: 5.0000 mg | ORAL_TABLET | Freq: Every day | ORAL | 2 refills | Status: DC

## 2022-10-04 NOTE — Telephone Encounter (Signed)
[facility], was advised that [facility] never received the Amlodipine 5  mg RX dated 05/18/22.  Resent refill to them today.  Patient notified VIA phone. Dm/cma

## 2022-10-04 NOTE — Telephone Encounter (Signed)
Prescription Request  10/04/2022  LOV: 08/23/2022  What is the name of the medication or equipment? amLODipine (NORVASC) 5 MG tablet [[id number]] , and metFORMIN   Have you contacted your pharmacy to request a refill? Yes   Which pharmacy would you like this sent to?  [phone] [facility], [place] - [address] [place] [address] Phone: [phone] Fax: [phone]    Patient notified that their request is being sent to the clinical staff for review and that they should receive a response within 2 business days.   Please advise at Mobile [phone] (mobile)

## 2022-11-06 ENCOUNTER — Telehealth: Payer: Self-pay | Admitting: Family Medicine

## 2022-11-06 NOTE — Telephone Encounter (Signed)
Pharmacy changed in the chart.  Dm/cma

## 2022-11-06 NOTE — Telephone Encounter (Signed)
Pt is wanting to change his pharmacy to  Pharmacy at [facility] [phone] [address] [place], [place] [address] [phone]  going forward. His pharmacy is closing.

## 2022-11-28 ENCOUNTER — Telehealth: Payer: Self-pay | Admitting: Family Medicine

## 2022-11-28 DIAGNOSIS — L304 Erythema intertrigo: Secondary | ICD-10-CM

## 2022-11-28 MED ORDER — CLOTRIMAZOLE-BETAMETHASONE 1-0.05 % EX CREA: 1.0000 | TOPICAL_CREAM | Freq: Every day | CUTANEOUS | 0 refills | Status: DC

## 2022-11-28 NOTE — Telephone Encounter (Signed)
[facility] [phone]  And [phone]  fax -[phone]  pt would like all his medications to be there for now on. Pt also need refill on ointment but don't know the name of it. Pt ask that you give him a call

## 2022-12-03 ENCOUNTER — Other Ambulatory Visit: Payer: Self-pay | Admitting: Family Medicine

## 2022-12-03 DIAGNOSIS — E119 Type 2 diabetes mellitus without complications: Secondary | ICD-10-CM

## 2022-12-04 NOTE — Telephone Encounter (Signed)
Scheduled for 12/12/22@ 2:20 pm. Dm/cma

## 2022-12-12 ENCOUNTER — Encounter: Payer: Self-pay | Admitting: Family Medicine

## 2022-12-12 ENCOUNTER — Ambulatory Visit: Payer: Commercial Managed Care - PPO | Admitting: Family Medicine

## 2022-12-12 ENCOUNTER — Telehealth: Payer: Self-pay | Admitting: Family Medicine

## 2022-12-12 VITALS — BP 122/80 | HR 83 | Temp 98.9°F | Ht 71.0 in | Wt 301.0 lb

## 2022-12-12 DIAGNOSIS — Z7985 Long-term (current) use of injectable non-insulin antidiabetic drugs: Secondary | ICD-10-CM

## 2022-12-12 DIAGNOSIS — I10 Essential (primary) hypertension: Secondary | ICD-10-CM

## 2022-12-12 DIAGNOSIS — E119 Type 2 diabetes mellitus without complications: Secondary | ICD-10-CM

## 2022-12-12 DIAGNOSIS — Z6841 Body Mass Index (BMI) 40.0 and over, adult: Secondary | ICD-10-CM

## 2022-12-12 DIAGNOSIS — Z7984 Long term (current) use of oral hypoglycemic drugs: Secondary | ICD-10-CM

## 2022-12-12 DIAGNOSIS — E782 Mixed hyperlipidemia: Secondary | ICD-10-CM | POA: Diagnosis not present

## 2022-12-12 DIAGNOSIS — E66813 Obesity, class 3: Secondary | ICD-10-CM

## 2022-12-12 DIAGNOSIS — M542 Cervicalgia: Secondary | ICD-10-CM

## 2022-12-12 LAB — HEMOGLOBIN A1C: Hgb A1c MFr Bld: 6.8 % — ABNORMAL HIGH (ref 4.6–6.5)

## 2022-12-12 MED ORDER — OZEMPIC (1 MG/DOSE) 4 MG/3ML ~~LOC~~ SOPN: PEN_INJECTOR | SUBCUTANEOUS | 2 refills | Status: DC

## 2022-12-12 MED ORDER — IBUPROFEN 600 MG PO TABS: 600.0000 mg | ORAL_TABLET | Freq: Three times a day (TID) | ORAL | 3 refills | Status: DC | PRN

## 2022-12-12 MED ORDER — ATORVASTATIN CALCIUM 40 MG PO TABS: 40.0000 mg | ORAL_TABLET | Freq: Every day | ORAL | 3 refills | Status: DC

## 2022-12-12 NOTE — Assessment & Plan Note (Signed)
At goal. Continue atorvastatin 40 mg daily.

## 2022-12-12 NOTE — Progress Notes (Signed)
[facility] [address] [place] [address] Dept: [phone] Dept Fax: [phone]  Chronic Care Office Visit  Subjective:    Patient ID: Edwin [name], male    DOB: 04/20/1965, 57 y.o..   MRN: [id number]  Chief Complaint  Patient presents with   Diabetes    F/u DM.  Average BS 120-150   History of Present Illness:  Patient is in today for reassessment of chronic medical issues.  Mr. Balko has a history of Type 2 diabetes. He is managed on metformin 1000 mg bid, pioglitazone 45 mg daily, and semaglutide (Ozempic) 1 mg weekly. He is tolerating his medicine without problems. He did have a lapse in availability of his Ozempic for abo9ut 3 weeks and will be restarting this soon.   Mr. Fossen has a history of hypertension. He is managed on amlodipine 5 mg daily.    Mr. Crupi has a history of hyperlipidemia. He is managed on atorvastatin 40 mg daily.  Past Medical History: Patient Active Problem List   Diagnosis Date Noted   At risk for obstructive sleep apnea 01/03/2022   Plantar fasciitis 01/03/2022   Pain due to onychomycosis of toenails of both feet 01/23/2021   Lymphadenopathy of head and neck 08/31/2020   Class 3 severe obesity due to excess calories with serious comorbidity and body mass index (BMI) of 40.0 to 44.9 in adult (HCC) 08/30/2020   Allergic rhinitis 05/30/2020   Erectile dysfunction 05/30/2020   Eustachian tube dysfunction, left 10/20/2019   Seborrheic keratosis 04/15/2017   PVC's (premature ventricular contractions) 09/03/2014   Essential hypertension 11/03/2013   Type 2 diabetes mellitus (HCC) 08/03/2013   Hyperlipidemia 08/03/2013   Low serum testosterone level 08/03/2013   Past Surgical History:  Procedure Laterality Date   COLONOSCOPY  10 years ago   at [facility] "normal" exam   NASAL SINUS SURGERY  2010   UPPER GASTROINTESTINAL ENDOSCOPY  10 years ago   WISDOM TOOTH EXTRACTION      Family History  Problem Relation Age of Onset   Aneurysm Mother 17       Deceased   Healthy Father        Living   Prostate cancer Paternal Uncle    Healthy Daughter        x3   Colon cancer Neg Hx    Esophageal cancer Neg Hx    Rectal cancer Neg Hx    Stomach cancer Neg Hx    Outpatient Medications Prior to Visit  Medication Sig Dispense Refill   acetaminophen (TYLENOL) 325 MG tablet Take 2 tablets (650 mg total) by mouth every 6 (six) hours as needed for mild pain or headache (fever >/= 101).     amLODipine (NORVASC) 5 MG tablet Take 1 tablet (5 mg total) by mouth daily. 90 tablet 2   aspirin EC 81 MG tablet Take 1 tablet by mouth daily.     Blood Glucose Monitoring Suppl (ONETOUCH VERIO FLEX SYSTEM) w/Device KIT 1 Units by Does not apply route daily. Dx:E11.9 1 kit 0   clotrimazole-betamethasone (LOTRISONE) cream Apply 1 Application topically daily. 30 g 0   fluticasone (FLONASE) 50 MCG/ACT nasal spray Place 2 sprays into both nostrils daily as needed for allergies or rhinitis. 16 g 6   glucose blood test strip Use as instructed daily Dx: E11.9 100 each 3   metFORMIN (GLUCOPHAGE) 1000 MG tablet Take 1 tablet (1,000 mg total) by mouth 2 (two) times daily with a meal. 180  tablet 3   multivitamin (ONE-A-DAY MEN'S) TABS tablet Take 1 tablet by mouth daily.     OneTouch Delica Lancets 33G MISC 1 Stick by Does not apply route daily. Dx:E11.9 100 each 3   pioglitazone (ACTOS) 45 MG tablet TAKE 1 TABLET BY MOUTH EVERY DAY 90 tablet 3   vitamin C (VITAMIN C) 500 MG tablet Take 1 tablet (500 mg total) by mouth daily. Please take for 2 weeks     atorvastatin (LIPITOR) 40 MG tablet TAKE 1.5 TABLETS BY MOUTH DAILY. (Patient taking differently: 1 daily) 135 tablet 3   ibuprofen (ADVIL) 600 MG tablet Take 1 tablet (600 mg total) by mouth every 8 (eight) hours as needed. 90 tablet 3   meloxicam (MOBIC) 15 MG tablet TAKE 1 TABLET (15 MG TOTAL) BY MOUTH DAILY. 30 tablet 0   Semaglutide, 1  MG/DOSE, (OZEMPIC, 1 MG/DOSE,) 4 MG/3ML SOPN INJECT 1 MG AS DIRECTED WEEKLY. 3 mL 2   No facility-administered medications prior to visit.   Allergies  Allergen Reactions   Avelox [Moxifloxacin Hcl In Nacl] Hives and Itching   Farxiga [Dapagliflozin] Hives   Doxycycline Rash   Objective:   Today's Vitals   12/12/22 1419  BP: 122/80  Pulse: 83  Temp: 98.9 F (37.2 C)  TempSrc: Temporal  SpO2: 98%  Weight: (!) 301 lb (136.5 kg)  Height: 5\' 11"  (1.803 m)   Body mass index is 41.98 kg/m.   General: Well developed, well nourished. No acute distress. Psych: Alert and oriented. Normal mood and affect.  Health Maintenance Due  Topic Date Due   Zoster Vaccines- Shingrix (1 of 2) Never done   OPHTHALMOLOGY EXAM  08/22/2022      Assessment & Plan:   Problem List Items Addressed This Visit       Cardiovascular and Mediastinum   Essential hypertension    Blood pressure is in good control. Continue amlodipine 5 mg daily.      Relevant Medications   atorvastatin (LIPITOR) 40 MG tablet     Endocrine   Type 2 diabetes mellitus (HCC) - Primary    Continue metformin 1000 mg bid, pioglitazone 45 mg daily, and semaglutide (Ozempic) 1 mg weekly.      Relevant Medications   Semaglutide, 1 MG/DOSE, (OZEMPIC, 1 MG/DOSE,) 4 MG/3ML SOPN   atorvastatin (LIPITOR) 40 MG tablet   Other Relevant Orders   Glucose, random   Hemoglobin A1c (Completed)     Other   Class 3 severe obesity due to excess calories with serious comorbidity and body mass index (BMI) of 40.0 to 44.9 in adult (HCC)    Maximum weight: 310 lbs (04/2022) Current weight: 301 lbs Weight change since last visit: + 8 lbs Total weight loss: 9 lbs (2.9%)  We will continue to monitor weight loss on semaglutide.      Relevant Medications   Semaglutide, 1 MG/DOSE, (OZEMPIC, 1 MG/DOSE,) 4 MG/3ML SOPN   Hyperlipidemia    At goal. Continue atorvastatin 40 mg daily.      Relevant Medications   atorvastatin (LIPITOR)  40 MG tablet   Other Relevant Orders   Lipid panel   Other Visit Diagnoses     Cervical pain (neck)       Stable. Continue ibuprofen for now.   Relevant Medications   ibuprofen (ADVIL) 600 MG tablet       Return in about 3 months (around 03/14/2023) for Reassessment.   Loyola Mast, MD

## 2022-12-12 NOTE — Assessment & Plan Note (Signed)
Blood pressure is in good control. Continue amlodipine 5 mg daily. 

## 2022-12-12 NOTE — Assessment & Plan Note (Signed)
Continue metformin 1000 mg bid, pioglitazone 45 mg daily, and semaglutide (Ozempic) 1 mg weekly.

## 2022-12-12 NOTE — Assessment & Plan Note (Signed)
Maximum weight: 310 lbs (04/2022) Current weight: 301 lbs Weight change since last visit: + 8 lbs Total weight loss: 9 lbs (2.9%)  We will continue to monitor weight loss on semaglutide.

## 2022-12-13 LAB — LIPID PANEL
Cholesterol: 145 mg/dL (ref 0–200)
HDL: 43.8 mg/dL (ref >39.00)
LDL Cholesterol: 87 mg/dL (ref 0–99)
NonHDL: 101.27
Total CHOL/HDL Ratio: 3
Triglycerides: 72 mg/dL (ref 0.0–149.0)
VLDL: 14.4 mg/dL (ref 0.0–40.0)

## 2022-12-13 LAB — GLUCOSE, RANDOM: Glucose, Bld: 112 mg/dL — ABNORMAL HIGH (ref 70–99)

## 2022-12-13 NOTE — Telephone Encounter (Signed)
error 

## 2022-12-31 ENCOUNTER — Telehealth: Payer: Self-pay | Admitting: Family Medicine

## 2022-12-31 NOTE — Telephone Encounter (Signed)
12/31/22 - Pt dropped off FMLA to be filled out by his pcp. He wants the form faxed to the fax number included on the form. Form is left in the folder at front office.

## 2022-12-31 NOTE — Telephone Encounter (Signed)
Form on CMA's desk to fill out and then give to provider to fill out the rest of the form. Dm/cma

## 2023-01-01 DIAGNOSIS — Z0279 Encounter for issue of other medical certificate: Secondary | ICD-10-CM

## 2023-01-01 NOTE — Telephone Encounter (Signed)
Form filled out/signed by provider/faxed to [phone].  Original placed in envelope to be picked up. Dm/cma

## 2023-01-14 ENCOUNTER — Telehealth: Payer: Self-pay

## 2023-01-14 DIAGNOSIS — N521 Erectile dysfunction due to diseases classified elsewhere: Secondary | ICD-10-CM

## 2023-01-14 MED ORDER — SILDENAFIL CITRATE 100 MG PO TABS: 50.0000 mg | ORAL_TABLET | Freq: Every day | ORAL | 11 refills | Status: DC | PRN

## 2023-01-14 NOTE — Telephone Encounter (Signed)
Patient called stating that the [employer] FMLA form needed to say that it was only good for 1 year instead of lifetime.  Will fix form and refax.   Patient would like to know if he can get a refill on Sildenafil he used to take due to he is starting to have ED issues again.  Please review and advise.  Thanks.  Dm/cma

## 2023-01-14 NOTE — Addendum Note (Signed)
Addended by: Loyola Mast on: 01/14/2023 05:45 PM   Modules accepted: Orders

## 2023-01-15 ENCOUNTER — Ambulatory Visit: Payer: Commercial Managed Care - PPO | Admitting: Family Medicine

## 2023-01-15 ENCOUNTER — Encounter: Payer: Self-pay | Admitting: Family Medicine

## 2023-01-15 VITALS — BP 126/70 | HR 64 | Temp 98.0°F | Ht 71.0 in | Wt 302.0 lb

## 2023-01-15 DIAGNOSIS — M545 Low back pain, unspecified: Secondary | ICD-10-CM | POA: Diagnosis not present

## 2023-01-15 DIAGNOSIS — H6992 Unspecified Eustachian tube disorder, left ear: Secondary | ICD-10-CM

## 2023-01-15 MED ORDER — IBUPROFEN 600 MG PO TABS: 600.0000 mg | ORAL_TABLET | Freq: Three times a day (TID) | ORAL | 3 refills | Status: DC | PRN

## 2023-01-15 NOTE — Telephone Encounter (Signed)
Has appt today . Dm/cma
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
## 2023-01-16 ENCOUNTER — Ambulatory Visit: Payer: Commercial Managed Care - PPO | Admitting: Family Medicine

## 2023-01-23 ENCOUNTER — Other Ambulatory Visit: Payer: Self-pay | Admitting: Family Medicine

## 2023-01-23 DIAGNOSIS — L304 Erythema intertrigo: Secondary | ICD-10-CM

## 2023-02-22 ENCOUNTER — Ambulatory Visit: Payer: Commercial Managed Care - PPO | Admitting: Family Medicine

## 2023-03-15 ENCOUNTER — Ambulatory Visit: Payer: Commercial Managed Care - PPO | Admitting: Family Medicine

## 2023-03-21 ENCOUNTER — Ambulatory Visit: Payer: Self-pay | Admitting: Family Medicine

## 2023-03-21 NOTE — Telephone Encounter (Signed)
Noted. Dm/cma  

## 2023-03-21 NOTE — Telephone Encounter (Signed)
Scheduled for 03/22/23 @ 4 pm. .dmc

## 2023-03-21 NOTE — Telephone Encounter (Signed)
  Chief Complaint: fever, cough Symptoms: fever, cough, headache, congestion Frequency: began yesterday evening Pertinent Negatives: Patient denies SOB, CP Disposition: [] ED /[] Urgent Care (no appt availability in office) / [] Appointment(In office/virtual)/ []  [place] [facility]/ [x] Home Care/ [] Refused Recommended Disposition /[] [place] [facility]/ []  Follow-up with PCP Additional Notes: Patient calls stating he has fever, cough, mild headache that began yesterday evening. Patient states he is taking tylenol and that is controlling the fever. Reports it is currently at 100.55F but has not taken tylenol since 0600- due to take now. Denies SOB. Per protocol, home care is appropriate. Care advice reviewed, patient verbalized understanding. Alerting PCP for review.   Copied from CRM [id number]. Topic: Clinical - Red Word Triage >> Mar 21, 2023 11:34 AM Florestine Avers wrote: Red Word that prompted transfer to Nurse Triage: Patient has a fever that started last night, slight cough, no other symptoms. Reason for Disposition  [1] Fever AND [2] no signs of serious infection or localizing symptoms (all other triage questions negative)  Answer Assessment - Initial Assessment Questions 1. TEMPERATURE: "What is the most recent temperature?"  "How was it measured?"      101.770F last night and this morning, took tylenol- 100.55F after tylenol 2. ONSET: "When did the fever start?"      Last night 3. CHILLS: "Do you have chills?" If yes: "How bad are they?"  (e.g., none, mild, moderate, severe)   - NONE: no chills   - MILD: feeling cold   - MODERATE: feeling very cold, some shivering (feels better under a thick blanket)   - SEVERE: feeling extremely cold with shaking chills (general body shaking, rigors; even under a thick blanket)      No chills 4. OTHER SYMPTOMS: "Do you have any other symptoms besides the fever?"  (e.g., abdomen pain, cough, diarrhea, earache, headache, sore throat, urination pain)      Cough- nonproductive, headache last night 5. CAUSE: If there are no symptoms, ask: "What do you think is causing the fever?"      NA 6. CONTACTS: "Does anyone else in the family have an infection?"     Illness going around at job 7. TREATMENT: "What have you done so far to treat this fever?" (e.g., medications)     Tylenol, theraflu 8. IMMUNOCOMPROMISE: "Do you have of the following: diabetes, HIV positive, splenectomy, cancer chemotherapy, chronic steroid treatment, transplant patient, etc."     Diabetic  10. TRAVEL: "Have you traveled out of the country in the last month?" (e.g., travel history, exposures)       Denies  Protocols used: [facility]

## 2023-03-21 NOTE — Telephone Encounter (Signed)
Copied from CRM [id number]. Topic: Clinical - Pink Word Triage >> Mar 21, 2023 12:52 PM Kathryne Eriksson wrote: Patient states he spoke with nurse triage earlier and was instructed to call back if his cough seemed to have gotten worse.     Chief Complaint: Cough Symptoms: Cough, fever Pertinent Negatives: Patient denies wheezing or chest pain Disposition: [] ED /[] Urgent Care (no appt availability in office) / [x] Appointment(In office/virtual)/ []  [place] [facility]/ [] Home Care/ [] Refused Recommended Disposition /[] [place] [facility]/ []  Follow-up with PCP  Additional Notes: Patient called to notify the office that his cough is becoming more constant. Patient already has appointment scheduled for tomorrow. I offered to schedule an appointment today at a different location. Patient declined because distance was too far. He did not want to do a virtual visit either. He stated he will keep the appointment for tomorrow. If he continues to become worse he will go to urgent care or ED. Advised patient to try OTC cough syrup and or/ cough drops in the meantime.   Reason for Disposition  [1] Continuous (nonstop) coughing interferes with work or school AND [2] no improvement using cough treatment per Care Advice  Answer Assessment - Initial Assessment Questions 1. ONSET: "When did the cough begin?"      Today, worsening 1 hour ago  2. SEVERITY: "How bad is the cough today?"      Constant coughing now  3. SPUTUM: "Describe the color of your sputum" (none, dry cough; clear, white, yellow, green)     None  4. DIFFICULTY BREATHING: "Are you having difficulty breathing?" If Yes, ask: "How bad is it?" (e.g., mild, moderate, severe)    - MILD: No SOB at rest, mild SOB with walking, speaks normally in sentences, can lie down, no retractions, pulse < 100.    - MODERATE: SOB at rest, SOB with minimal exertion and prefers to sit, cannot lie down flat, speaks in phrases, mild retractions, audible wheezing,  pulse 100-120.    - SEVERE: Very SOB at rest, speaks in single words, struggling to breathe, sitting hunched forward, retractions, pulse > 120      Only has SOB when coughing. None at rest  5. FEVER: "Do you have a fever?" If Yes, ask: "What is your temperature, how was it measured, and when did it start?"     100.1   6. OTHER SYMPTOMS: "Do you have any other symptoms?" (e.g., runny nose, wheezing, chest pain)       No  Protocols used: Cough - Acute Non-Productive-A-AH

## 2023-03-22 ENCOUNTER — Encounter: Payer: Self-pay | Admitting: Family Medicine

## 2023-03-22 ENCOUNTER — Ambulatory Visit: Payer: Commercial Managed Care - PPO | Admitting: Family Medicine

## 2023-03-22 VITALS — BP 122/70 | HR 80 | Temp 98.7°F | Ht 71.0 in | Wt 295.6 lb

## 2023-03-22 DIAGNOSIS — L304 Erythema intertrigo: Secondary | ICD-10-CM | POA: Insufficient documentation

## 2023-03-22 DIAGNOSIS — J069 Acute upper respiratory infection, unspecified: Secondary | ICD-10-CM | POA: Insufficient documentation

## 2023-03-22 DIAGNOSIS — Z7985 Long-term (current) use of injectable non-insulin antidiabetic drugs: Secondary | ICD-10-CM

## 2023-03-22 DIAGNOSIS — E119 Type 2 diabetes mellitus without complications: Secondary | ICD-10-CM | POA: Diagnosis not present

## 2023-03-22 DIAGNOSIS — Z7984 Long term (current) use of oral hypoglycemic drugs: Secondary | ICD-10-CM

## 2023-03-22 DIAGNOSIS — E782 Mixed hyperlipidemia: Secondary | ICD-10-CM

## 2023-03-22 DIAGNOSIS — I10 Essential (primary) hypertension: Secondary | ICD-10-CM

## 2023-03-22 LAB — POCT INFLUENZA A/B
Influenza A, POC: NEGATIVE
Influenza B, POC: NEGATIVE

## 2023-03-22 LAB — POC COVID19 BINAXNOW: SARS Coronavirus 2 Ag: NEGATIVE

## 2023-03-22 MED ORDER — CLOTRIMAZOLE-BETAMETHASONE 1-0.05 % EX CREA: TOPICAL_CREAM | Freq: Two times a day (BID) | CUTANEOUS | 1 refills | Status: DC

## 2023-03-22 NOTE — Assessment & Plan Note (Signed)
At goal. Continue atorvastatin 40 mg daily.

## 2023-03-22 NOTE — Assessment & Plan Note (Signed)
Discussed home care for viral illness, including rest, pushing fluids, and OTC medications as needed for symptom relief. Recommend hot tea with honey for sore throat symptoms. Follow-up if needed for worsening or persistent symptoms.  

## 2023-03-22 NOTE — Assessment & Plan Note (Signed)
Blood pressure is in good control. Continue amlodipine 5 mg daily. 

## 2023-03-22 NOTE — Progress Notes (Signed)
[facility] [address] [place] [address] Dept: [phone] Dept Fax: [phone]  Office Visit  Subjective:    Patient ID: Edwin [name], male    DOB: [birth year], 58 y.o..   MRN: [id number]  Chief Complaint  Patient presents with   Cough    C/o having cough, stuffy nose, ST, x 3 days.  Has been taking Thera-Flu, Robitusin.   History of Present Illness:  Patient is in today for complaining of a 3-4 day history of nasal congestion, sore throat, and cough. He notes that multiple co-workers have been sick. He has not had fever. He has been using Thera-flu and Robitussin for symptom management.  Edwin [name] has a history of Type 2 diabetes. He is managed on metformin 1000 mg bid, pioglitazone 45 mg daily, and semaglutide (Ozempic) 1 mg weekly. He is tolerating his medicine without problems. He did have a lapse in availability of his Ozempic for abo9ut 3 weeks and will be restarting this soon.   Edwin [name] has a history of hypertension. He is managed on amlodipine 5 mg daily.    Edwin [name] has a history of hyperlipidemia. He is managed on atorvastatin 40 mg daily.  Past Medical History: Patient Active Problem List   Diagnosis Date Noted   At risk for obstructive sleep apnea 01/03/2022   Plantar fasciitis 01/03/2022   Pain due to onychomycosis of toenails of both feet 01/23/2021   Lymphadenopathy of head and neck 08/31/2020   Class 3 severe obesity due to excess calories with serious comorbidity and body mass index (BMI) of 40.0 to 44.9 in adult (HCC) 08/30/2020   Allergic rhinitis 05/30/2020   Erectile dysfunction 05/30/2020   Eustachian tube dysfunction, left 10/20/2019   Seborrheic keratosis 04/15/2017   PVC's (premature ventricular contractions) 09/03/2014   Essential hypertension 11/03/2013   Type 2 diabetes mellitus (HCC) 08/03/2013   Hyperlipidemia 08/03/2013   Low serum testosterone level 08/03/2013   Past  Surgical History:  Procedure Laterality Date   COLONOSCOPY  10 years ago   at [facility] "normal" exam   NASAL SINUS SURGERY  2010   UPPER GASTROINTESTINAL ENDOSCOPY  10 years ago   WISDOM TOOTH EXTRACTION     Family History  Problem Relation Age of Onset   Aneurysm Mother 15       Deceased   Healthy Father        Living   Prostate cancer Paternal Uncle    Healthy Daughter        x3   Colon cancer Neg Hx    Esophageal cancer Neg Hx    Rectal cancer Neg Hx    Stomach cancer Neg Hx    Outpatient Medications Prior to Visit  Medication Sig Dispense Refill   acetaminophen (TYLENOL) 325 MG tablet Take 2 tablets (650 mg total) by mouth every 6 (six) hours as needed for mild pain or headache (fever >/= 101).     amLODipine (NORVASC) 5 MG tablet Take 1 tablet (5 mg total) by mouth daily. 90 tablet 2   aspirin EC 81 MG tablet Take 1 tablet by mouth daily.     atorvastatin (LIPITOR) 40 MG tablet Take 1 tablet (40 mg total) by mouth daily. 1 daily 90 tablet 3   Blood Glucose Monitoring Suppl (ONETOUCH VERIO FLEX SYSTEM) w/Device KIT 1 Units by Does not apply route daily. Dx:E11.9 1 kit 0   fluticasone (FLONASE) 50 MCG/ACT nasal spray Place 2 sprays into both nostrils daily as  needed for allergies or rhinitis. 16 g 6   glucose blood test strip Use as instructed daily Dx: E11.9 100 each 3   ibuprofen (ADVIL) 600 MG tablet Take 1 tablet (600 mg total) by mouth every 8 (eight) hours as needed. 90 tablet 3   metFORMIN (GLUCOPHAGE) 1000 MG tablet Take 1 tablet (1,000 mg total) by mouth 2 (two) times daily with a meal. 180 tablet 3   multivitamin (ONE-A-DAY MEN'S) TABS tablet Take 1 tablet by mouth daily.     OneTouch Delica Lancets 33G MISC 1 Stick by Does not apply route daily. Dx:E11.9 100 each 3   pioglitazone (ACTOS) 45 MG tablet TAKE 1 TABLET BY MOUTH EVERY DAY 90 tablet 3   Semaglutide, 1 MG/DOSE, (OZEMPIC, 1 MG/DOSE,) 4 MG/3ML SOPN Inject 1 mg as directed weekly. 3 mL 2   sildenafil  (VIAGRA) 100 MG tablet Take 0.5-1 tablets (50-100 mg total) by mouth daily as needed for erectile dysfunction. 5 tablet 11   vitamin C (VITAMIN C) 500 MG tablet Take 1 tablet (500 mg total) by mouth daily. Please take for 2 weeks     clotrimazole-betamethasone (LOTRISONE) cream APPLY TOPICALLY TO THE AFFECTED AREA(S) ONCE DAILY 30 g 0   No facility-administered medications prior to visit.   Allergies  Allergen Reactions   Avelox [Moxifloxacin Hcl In Nacl] Hives and Itching   Farxiga [Dapagliflozin] Hives   Doxycycline Rash     Objective:   Today's Vitals   03/22/23 1611  BP: 122/70  Pulse: 80  Temp: 98.7 F (37.1 C)  TempSrc: Temporal  SpO2: 97%  Weight: 295 lb 9.6 oz (134.1 kg)  Height: 5\' 11"  (1.803 m)   Body mass index is 41.23 kg/m.   General: Well developed, well nourished. No acute distress. HEENT: Normocephalic, non-traumatic. PERRL, EOMI. Conjunctiva clear. External ears normal. EAC   and TMs normal bilaterally. Nose with mild ot moderate congestion and rhinorrhea. Mucous   membranes moist. Oropharynx clear. Good dentition. Neck: Supple. No lymphadenopathy. No thyromegaly. Lungs: Clear to auscultation bilaterally. No wheezing, rales or rhonchi. Psych: Alert and oriented. Normal mood and affect.  Health Maintenance Due  Topic Date Due   Pneumococcal Vaccine 45-42 Years old (1 of 2 - PCV) Never done   Zoster Vaccines- Shingrix (1 of 2) Never done   OPHTHALMOLOGY EXAM  08/22/2022   FOOT EXAM  01/04/2023   Lab Results POCT Covid: Neg. POCT Influenza A& B: Neg.    Assessment & Plan:   Problem List Items Addressed This Visit       Cardiovascular and Mediastinum   Essential hypertension   Blood pressure is in good control. Continue amlodipine 5 mg daily.        Respiratory   Viral URI with cough - Primary   Discussed home care for viral illness, including rest, pushing fluids, and OTC medications as needed for symptom relief. Recommend hot tea with honey  for sore throat symptoms. Follow-up if needed for worsening or persistent symptoms.       Relevant Medications   clotrimazole-betamethasone (LOTRISONE) cream   Other Relevant Orders   POCT Influenza A/B   POC COVID-19     Endocrine   Type 2 diabetes mellitus (HCC)   I will check an A1c. Continue metformin 1000 mg bid, pioglitazone 45 mg daily, and semaglutide (Ozempic) 1 mg weekly.      Relevant Orders   Glucose, random   Hemoglobin A1c     Musculoskeletal and Integument   Intertrigo  Intermittent. Continue Lotrisone as needed.      Relevant Medications   clotrimazole-betamethasone (LOTRISONE) cream     Other   Hyperlipidemia   At goal. Continue atorvastatin 40 mg daily.       Return in about 3 months (around 06/20/2023) for Reassessment.   Loyola Mast, MD

## 2023-03-22 NOTE — Assessment & Plan Note (Signed)
Intermittent. Continue Lotrisone as needed.

## 2023-03-22 NOTE — Assessment & Plan Note (Signed)
I will check an A1c. Continue metformin 1000 mg bid, pioglitazone 45 mg daily, and semaglutide (Ozempic) 1 mg weekly.

## 2023-03-25 ENCOUNTER — Ambulatory Visit: Payer: Commercial Managed Care - PPO | Admitting: Family Medicine

## 2023-03-26 ENCOUNTER — Inpatient Hospital Stay (HOSPITAL_BASED_OUTPATIENT_CLINIC_OR_DEPARTMENT_OTHER)
Admission: EM | Admit: 2023-03-26 | Discharge: 2023-03-29 | DRG: 872 | Disposition: A | Discharge status: Home / Self Care | Payer: Commercial Managed Care - PPO | Attending: Internal Medicine | Admitting: Internal Medicine

## 2023-03-26 ENCOUNTER — Emergency Department (HOSPITAL_BASED_OUTPATIENT_CLINIC_OR_DEPARTMENT_OTHER): Payer: Commercial Managed Care - PPO

## 2023-03-26 ENCOUNTER — Other Ambulatory Visit: Payer: Self-pay | Admitting: Family Medicine

## 2023-03-26 ENCOUNTER — Other Ambulatory Visit: Payer: Self-pay

## 2023-03-26 ENCOUNTER — Ambulatory Visit: Payer: Self-pay | Admitting: Family Medicine

## 2023-03-26 ENCOUNTER — Inpatient Hospital Stay (HOSPITAL_COMMUNITY): Payer: Commercial Managed Care - PPO

## 2023-03-26 ENCOUNTER — Encounter (HOSPITAL_BASED_OUTPATIENT_CLINIC_OR_DEPARTMENT_OTHER): Payer: Self-pay | Admitting: Emergency Medicine

## 2023-03-26 ENCOUNTER — Emergency Department (HOSPITAL_BASED_OUTPATIENT_CLINIC_OR_DEPARTMENT_OTHER)
Admission: EM | Admit: 2023-03-26 | Discharge: 2023-03-26 | Disposition: A | Discharge status: Home / Self Care | Payer: Commercial Managed Care - PPO | Source: Home / Self Care | Attending: Emergency Medicine | Admitting: Emergency Medicine

## 2023-03-26 DIAGNOSIS — E876 Hypokalemia: Secondary | ICD-10-CM | POA: Diagnosis not present

## 2023-03-26 DIAGNOSIS — J101 Influenza due to other identified influenza virus with other respiratory manifestations: Secondary | ICD-10-CM | POA: Diagnosis present

## 2023-03-26 DIAGNOSIS — A419 Sepsis, unspecified organism: Principal | ICD-10-CM | POA: Diagnosis present

## 2023-03-26 DIAGNOSIS — Z888 Allergy status to other drugs, medicaments and biological substances status: Secondary | ICD-10-CM

## 2023-03-26 DIAGNOSIS — I10 Essential (primary) hypertension: Secondary | ICD-10-CM | POA: Diagnosis present

## 2023-03-26 DIAGNOSIS — Z79899 Other long term (current) drug therapy: Secondary | ICD-10-CM | POA: Insufficient documentation

## 2023-03-26 DIAGNOSIS — E785 Hyperlipidemia, unspecified: Secondary | ICD-10-CM | POA: Diagnosis present

## 2023-03-26 DIAGNOSIS — E119 Type 2 diabetes mellitus without complications: Secondary | ICD-10-CM

## 2023-03-26 DIAGNOSIS — Z7984 Long term (current) use of oral hypoglycemic drugs: Secondary | ICD-10-CM

## 2023-03-26 DIAGNOSIS — Z7982 Long term (current) use of aspirin: Secondary | ICD-10-CM

## 2023-03-26 DIAGNOSIS — E7800 Pure hypercholesterolemia, unspecified: Secondary | ICD-10-CM | POA: Diagnosis present

## 2023-03-26 DIAGNOSIS — R509 Fever, unspecified: Secondary | ICD-10-CM | POA: Diagnosis present

## 2023-03-26 DIAGNOSIS — N3001 Acute cystitis with hematuria: Secondary | ICD-10-CM | POA: Diagnosis present

## 2023-03-26 DIAGNOSIS — B9620 Unspecified Escherichia coli [E. coli] as the cause of diseases classified elsewhere: Secondary | ICD-10-CM | POA: Diagnosis present

## 2023-03-26 DIAGNOSIS — Z1152 Encounter for screening for COVID-19: Secondary | ICD-10-CM | POA: Diagnosis not present

## 2023-03-26 DIAGNOSIS — Z87891 Personal history of nicotine dependence: Secondary | ICD-10-CM | POA: Diagnosis not present

## 2023-03-26 DIAGNOSIS — N390 Urinary tract infection, site not specified: Secondary | ICD-10-CM | POA: Diagnosis present

## 2023-03-26 DIAGNOSIS — N3090 Cystitis, unspecified without hematuria: Secondary | ICD-10-CM

## 2023-03-26 LAB — URINALYSIS, ROUTINE W REFLEX MICROSCOPIC
Bilirubin Urine: NEGATIVE
Bilirubin Urine: NEGATIVE
Glucose, UA: NEGATIVE mg/dL
Glucose, UA: NEGATIVE mg/dL
Ketones, ur: NEGATIVE mg/dL
Ketones, ur: NEGATIVE mg/dL
Nitrite: NEGATIVE
Nitrite: NEGATIVE
Protein, ur: 30 mg/dL — AB
Protein, ur: 30 mg/dL — AB
Specific Gravity, Urine: 1.01 (ref 1.005–1.030)
Specific Gravity, Urine: 1.02 (ref 1.005–1.030)
pH: 6.5 (ref 5.0–8.0)
pH: 6.5 (ref 5.0–8.0)

## 2023-03-26 LAB — URINALYSIS, MICROSCOPIC (REFLEX): RBC / HPF: >50 RBC/hpf (ref 0–5)

## 2023-03-26 LAB — CBC WITH DIFFERENTIAL/PLATELET
Abs Immature Granulocytes: 0.06 10*3/uL (ref 0.00–0.07)
Basophils Absolute: 0 10*3/uL (ref 0.0–0.1)
Basophils Relative: 0 %
Eosinophils Absolute: 0 10*3/uL (ref 0.0–0.5)
Eosinophils Relative: 0 %
HCT: 42.6 % (ref 39.0–52.0)
Hemoglobin: 14.3 g/dL (ref 13.0–17.0)
Immature Granulocytes: 0 %
Lymphocytes Relative: 7 %
Lymphs Abs: 1 10*3/uL (ref 0.7–4.0)
MCH: 26.8 pg (ref 26.0–34.0)
MCHC: 33.6 g/dL (ref 30.0–36.0)
MCV: 79.9 fL — ABNORMAL LOW (ref 80.0–100.0)
Monocytes Absolute: 0.8 10*3/uL (ref 0.1–1.0)
Monocytes Relative: 5 %
Neutro Abs: 13.5 10*3/uL — ABNORMAL HIGH (ref 1.7–7.7)
Neutrophils Relative %: 88 %
Platelets: 277 10*3/uL (ref 150–400)
RBC: 5.33 MIL/uL (ref 4.22–5.81)
RDW: 13.6 % (ref 11.5–15.5)
WBC: 15.5 10*3/uL — ABNORMAL HIGH (ref 4.0–10.5)
nRBC: 0 % (ref 0.0–0.2)

## 2023-03-26 LAB — COMPREHENSIVE METABOLIC PANEL
ALT: 39 U/L (ref 0–44)
AST: 29 U/L (ref 15–41)
Albumin: 4.2 g/dL (ref 3.5–5.0)
Alkaline Phosphatase: 88 U/L (ref 38–126)
Anion gap: 9 (ref 5–15)
BUN: 10 mg/dL (ref 6–20)
CO2: 26 mmol/L (ref 22–32)
Calcium: 9.1 mg/dL (ref 8.9–10.3)
Chloride: 99 mmol/L (ref 98–111)
Creatinine, Ser: 0.87 mg/dL (ref 0.61–1.24)
GFR, Estimated: >60 mL/min (ref >60)
Glucose, Bld: 183 mg/dL — ABNORMAL HIGH (ref 70–99)
Potassium: 3.8 mmol/L (ref 3.5–5.1)
Sodium: 134 mmol/L — ABNORMAL LOW (ref 135–145)
Total Bilirubin: 0.8 mg/dL (ref 0.0–1.2)
Total Protein: 8.1 g/dL (ref 6.5–8.1)

## 2023-03-26 LAB — RESP PANEL BY RT-PCR (RSV, FLU A&B, COVID)  RVPGX2
Influenza A by PCR: NEGATIVE
Influenza B by PCR: NEGATIVE
Resp Syncytial Virus by PCR: NEGATIVE
SARS Coronavirus 2 by RT PCR: NEGATIVE

## 2023-03-26 LAB — GLUCOSE, CAPILLARY
Glucose-Capillary: 187 mg/dL — ABNORMAL HIGH (ref 70–99)
Glucose-Capillary: 153 mg/dL — ABNORMAL HIGH (ref 70–99)

## 2023-03-26 LAB — HEMOGLOBIN A1C
Hgb A1c MFr Bld: 7.3 % — ABNORMAL HIGH (ref 4.8–5.6)
Mean Plasma Glucose: 162.81 mg/dL

## 2023-03-26 LAB — APTT: aPTT: 26 s (ref 24–36)

## 2023-03-26 LAB — PROTIME-INR
INR: 1 (ref 0.8–1.2)
Prothrombin Time: 13.6 s (ref 11.4–15.2)

## 2023-03-26 LAB — LACTIC ACID, PLASMA
Lactic Acid, Venous: 1.5 mmol/L (ref 0.5–1.9)
Lactic Acid, Venous: 2.1 mmol/L (ref 0.5–1.9)

## 2023-03-26 MED ORDER — SODIUM CHLORIDE 0.9 % IV BOLUS
1000.0000 mL | Freq: Once | INTRAVENOUS | Status: AC
  Administered 2023-03-26: 1000 mL via INTRAVENOUS

## 2023-03-26 MED ORDER — SODIUM CHLORIDE 0.9 % IV BOLUS
500.0000 mL | Freq: Once | INTRAVENOUS | Status: AC
  Administered 2023-03-26: 500 mL via INTRAVENOUS

## 2023-03-26 MED ORDER — ACETAMINOPHEN 500 MG PO TABS
1000.0000 mg | ORAL_TABLET | Freq: Once | ORAL | Status: AC
  Administered 2023-03-26: 1000 mg via ORAL
  Filled 2023-03-26: qty 2

## 2023-03-26 MED ORDER — INSULIN ASPART 100 UNIT/ML IJ SOLN
0.0000 [IU] | Freq: Every day | INTRAMUSCULAR | Status: DC
Start: 2023-03-26 — End: 2023-03-29

## 2023-03-26 MED ORDER — VANCOMYCIN HCL 2000 MG/400ML IV SOLN: 2000.0000 mg | Freq: Once | INTRAVENOUS | Status: DC

## 2023-03-26 MED ORDER — VANCOMYCIN HCL IN DEXTROSE 1-5 GM/200ML-% IV SOLN
1000.0000 mg | Freq: Once | INTRAVENOUS | Status: DC
  Filled 2023-03-26: qty 200

## 2023-03-26 MED ORDER — POLYETHYLENE GLYCOL 3350 17 G PO PACK: 17.0000 g | PACK | Freq: Every day | ORAL | Status: DC | PRN

## 2023-03-26 MED ORDER — SODIUM CHLORIDE 0.9 % IV BOLUS: 1000.0000 mL | Freq: Once | INTRAVENOUS | Status: DC

## 2023-03-26 MED ORDER — KETOROLAC TROMETHAMINE 30 MG/ML IJ SOLN
30.0000 mg | Freq: Once | INTRAMUSCULAR | Status: AC
  Administered 2023-03-26: 30 mg via INTRAVENOUS
  Filled 2023-03-26: qty 1

## 2023-03-26 MED ORDER — VANCOMYCIN HCL IN DEXTROSE 1-5 GM/200ML-% IV SOLN
1000.0000 mg | Freq: Once | INTRAVENOUS | Status: DC
Start: 2023-03-26 — End: 2023-03-26

## 2023-03-26 MED ORDER — SODIUM CHLORIDE 0.9% FLUSH: 3.0000 mL | INTRAVENOUS | Status: DC | PRN

## 2023-03-26 MED ORDER — ONDANSETRON HCL 4 MG/2ML IJ SOLN
4.0000 mg | Freq: Once | INTRAMUSCULAR | Status: AC
  Administered 2023-03-26: 4 mg via INTRAVENOUS

## 2023-03-26 MED ORDER — ONDANSETRON HCL 4 MG/2ML IJ SOLN: 4.0000 mg | Freq: Four times a day (QID) | INTRAMUSCULAR | Status: DC | PRN

## 2023-03-26 MED ORDER — CEFDINIR 300 MG PO CAPS: 300.0000 mg | ORAL_CAPSULE | Freq: Two times a day (BID) | ORAL | 0 refills | Status: DC

## 2023-03-26 MED ORDER — CEFTRIAXONE SODIUM 2 G IJ SOLR
2.0000 g | Freq: Once | INTRAMUSCULAR | Status: AC
  Administered 2023-03-26: 2 g via INTRAVENOUS
  Filled 2023-03-26: qty 20

## 2023-03-26 MED ORDER — PIPERACILLIN-TAZOBACTAM 4.5 G IVPB: 4.5000 g | Freq: Once | INTRAVENOUS | Status: DC

## 2023-03-26 MED ORDER — CEPHALEXIN 250 MG PO CAPS
500.0000 mg | ORAL_CAPSULE | Freq: Once | ORAL | Status: AC
  Administered 2023-03-26: 500 mg via ORAL
  Filled 2023-03-26: qty 2

## 2023-03-26 MED ORDER — SODIUM CHLORIDE 0.9 % IV SOLN: 250.0000 mL | INTRAVENOUS | Status: AC | PRN

## 2023-03-26 MED ORDER — VANCOMYCIN HCL IN DEXTROSE 1-5 GM/200ML-% IV SOLN
1000.0000 mg | Freq: Once | INTRAVENOUS | Status: AC
  Administered 2023-03-26: 1000 mg via INTRAVENOUS
  Filled 2023-03-26: qty 200

## 2023-03-26 MED ORDER — PIPERACILLIN-TAZOBACTAM 3.375 G IVPB 30 MIN
3.3750 g | Freq: Once | INTRAVENOUS | Status: AC
  Administered 2023-03-26: 3.375 g via INTRAVENOUS
  Filled 2023-03-26: qty 50

## 2023-03-26 MED ORDER — VANCOMYCIN HCL 1.5 G IV SOLR: 1500.0000 mg | Freq: Once | INTRAVENOUS | Status: DC

## 2023-03-26 MED ORDER — SODIUM CHLORIDE 0.9% FLUSH
3.0000 mL | Freq: Two times a day (BID) | INTRAVENOUS | Status: DC
  Administered 2023-03-26 – 2023-03-29 (×6): 3 mL via INTRAVENOUS

## 2023-03-26 MED ORDER — ACETAMINOPHEN 325 MG PO TABS
650.0000 mg | ORAL_TABLET | Freq: Four times a day (QID) | ORAL | Status: DC | PRN
  Administered 2023-03-26 – 2023-03-29 (×6): 650 mg via ORAL
  Filled 2023-03-26 (×7): qty 2

## 2023-03-26 MED ORDER — ENOXAPARIN SODIUM 80 MG/0.8ML IJ SOSY: 70.0000 mg | PREFILLED_SYRINGE | INTRAMUSCULAR | Status: DC

## 2023-03-26 MED ORDER — SODIUM CHLORIDE 0.9 % IV SOLN
INTRAVENOUS | Status: AC
Start: 2023-03-26 — End: 2023-03-27

## 2023-03-26 MED ORDER — HYDROCODONE-ACETAMINOPHEN 5-325 MG PO TABS
1.0000 | ORAL_TABLET | Freq: Four times a day (QID) | ORAL | Status: DC | PRN
  Administered 2023-03-28 (×3): 1 via ORAL
  Filled 2023-03-26 (×3): qty 1

## 2023-03-26 MED ORDER — IOHEXOL 300 MG/ML  SOLN
100.0000 mL | Freq: Once | INTRAMUSCULAR | Status: AC | PRN
  Administered 2023-03-26: 125 mL via INTRAVENOUS

## 2023-03-26 MED ORDER — INSULIN ASPART 100 UNIT/ML IJ SOLN
0.0000 [IU] | Freq: Three times a day (TID) | INTRAMUSCULAR | Status: DC
Start: 2023-03-27 — End: 2023-03-29
  Administered 2023-03-27 (×2): 2 [IU] via SUBCUTANEOUS
  Administered 2023-03-28: 3 [IU] via SUBCUTANEOUS
  Administered 2023-03-28 (×2): 2 [IU] via SUBCUTANEOUS
  Administered 2023-03-29: 3 [IU] via SUBCUTANEOUS

## 2023-03-26 MED ORDER — SODIUM CHLORIDE 0.9 % IV SOLN
2.0000 g | INTRAVENOUS | Status: DC
  Administered 2023-03-27: 2 g via INTRAVENOUS
  Filled 2023-03-26: qty 20

## 2023-03-26 MED ORDER — VANCOMYCIN HCL IN DEXTROSE 1-5 GM/200ML-% IV SOLN
1000.0000 mg | Freq: Once | INTRAVENOUS | Status: AC
  Administered 2023-03-26: 1000 mg via INTRAVENOUS

## 2023-03-26 MED ORDER — ONDANSETRON HCL 4 MG PO TABS
4.0000 mg | ORAL_TABLET | Freq: Four times a day (QID) | ORAL | Status: DC | PRN
Start: 2023-03-26 — End: 2023-03-29

## 2023-03-26 MED ORDER — ONDANSETRON HCL 4 MG/2ML IJ SOLN
INTRAMUSCULAR | Status: AC
  Filled 2023-03-26: qty 2

## 2023-03-26 NOTE — ED Notes (Addendum)
Attempted second IV stick, vein blew. Dc'd attempt and continued antbx, one set of cultures already obtained.

## 2023-03-26 NOTE — Progress Notes (Signed)
Patient came to 5 E with RED MEWS score. Charge nurse was notified and rapid response was called. Patient is alert and oriented x4. Temperature is elevated 102. Cold therapy was applied.

## 2023-03-26 NOTE — Discharge Instructions (Addendum)
Drink plenty of fluids.  Take the antibiotic twice a day until it is completely gone.  Please follow-up with your primary care provider after you finish the antibiotic to make sure that the infection actually has cleared.  At any point, if you start running a fever or start having vomiting or abdominal or flank pain, return to the emergency department for reevaluation.

## 2023-03-26 NOTE — ED Notes (Signed)
[facility] called for transport @17 :18.  Spoke with [facility].

## 2023-03-26 NOTE — ED Triage Notes (Signed)
Pt went to use restroom and notice discomfort about 9 pm last night. Got up in middle of night to use restroom and noticed blood in urine. Call his emergency line and was told to come to ED.

## 2023-03-26 NOTE — ED Notes (Signed)
This RN went to check pt's temperature prior to being transported by [facility]. Pt seen to be violently shaking, with a temp of 103.1. Pt c/o nausea and lip and chest pain. Dr. Renaye Rakers made aware and ordered Toradol. Pt did not experience relief of symptoms and began vomiting. HR in the 150's with a good wave. Repeat EKG done and Zofran given. Pt given second bolus of fluids and started on additional antibiotics prior to leaving with transport.   Robina Ade, RN

## 2023-03-26 NOTE — Significant Event (Signed)
Rapid Response Event Note   Reason for Call :  Patient arrived from outside ED, Code Sepsis  Initial Focused Assessment:  Patient Aox4, not having any pain, only pain with urination. Reporting blood in urine at home. No abnormal heart or lungs sounds, no peripheral edema. No other obvious signs of distress. All Code Sepsis interventions completed. Patient had ice packs and decreased room temperature upon arrival to room. Called MD to bedside.  Interventions:  MD at bedside provided order for fluid bolus, tylenol, and changed LOC to progressive.  Plan of Care:  Bedside RN to give report and transfer patient to 1418 PCU. RR RN will continue to monitor throughout night.  Event Summary:   MD Notified: Dr. Izora Gala Call Time: 1934 Arrival Time: 1938 End Time: 2008  Rockne Menghini, RN Completed with Loney Laurence, RR RN

## 2023-03-26 NOTE — ED Triage Notes (Signed)
Pt seen last night for the same, reports continuing hematuria with clots and increased pain

## 2023-03-26 NOTE — ED Provider Notes (Signed)
[place] [facility] Provider Note   CSN: [id number] Arrival date & time: 03/26/23  3329     History  Chief Complaint  Patient presents with   Hematuria    Edwin [name] is a 58 y.o. male.  The history is provided by the patient.  Hematuria  He has history of hypertension, diabetes, hyperlipidemia and comes in because of urinary urgency and frequency which started this evening.  He then started having blood in his urine with presence of small clots.  He denies any abdominal pain or flank pain.  He denies fever, chills, sweats.  He denies nausea or vomiting.   Home Medications Prior to Admission medications   Medication Sig Start Date End Date Taking? Authorizing Provider  acetaminophen (TYLENOL) 325 MG tablet Take 2 tablets (650 mg total) by mouth every 6 (six) hours as needed for mild pain or headache (fever >/= 101). 08/21/18   Elgergawy, Leana Roe, MD  amLODipine (NORVASC) 5 MG tablet Take 1 tablet (5 mg total) by mouth daily. 10/04/22   Loyola Mast, MD  aspirin EC 81 MG tablet Take 1 tablet by mouth daily.    [provider]  atorvastatin (LIPITOR) 40 MG tablet Take 1 tablet (40 mg total) by mouth daily. 1 daily 12/12/22   Loyola Mast, MD  Blood Glucose Monitoring Suppl (ONETOUCH VERIO FLEX SYSTEM) w/Device KIT 1 Units by Does not apply route daily. Dx:E11.9 03/08/21   Loyola Mast, MD  clotrimazole-betamethasone (LOTRISONE) cream Apply topically 2 (two) times daily. 03/22/23   Loyola Mast, MD  fluticasone Aleda Grana) 50 MCG/ACT nasal spray Place 2 sprays into both nostrils daily as needed for allergies or rhinitis. 11/16/20   Loyola Mast, MD  glucose blood test strip Use as instructed daily Dx: E11.9 09/19/18   Waldon Merl, PA-C  ibuprofen (ADVIL) 600 MG tablet Take 1 tablet (600 mg total) by mouth every 8 (eight) hours as needed. 01/15/23   Loyola Mast, MD  metFORMIN (GLUCOPHAGE) 1000 MG tablet Take 1 tablet  (1,000 mg total) by mouth 2 (two) times daily with a meal. 06/04/22   Loyola Mast, MD  multivitamin (ONE-A-DAY MEN'S) TABS tablet Take 1 tablet by mouth daily.    [provider]  OneTouch Delica Lancets 33G MISC 1 Stick by Does not apply route daily. Dx:E11.9 03/08/21   Loyola Mast, MD  pioglitazone (ACTOS) 45 MG tablet TAKE 1 TABLET BY MOUTH EVERY DAY 10/01/22   Loyola Mast, MD  Semaglutide, 1 MG/DOSE, (OZEMPIC, 1 MG/DOSE,) 4 MG/3ML SOPN Inject 1 mg as directed weekly. 12/12/22   Loyola Mast, MD  sildenafil (VIAGRA) 100 MG tablet Take 0.5-1 tablets (50-100 mg total) by mouth daily as needed for erectile dysfunction. 01/14/23   Loyola Mast, MD  vitamin C (VITAMIN C) 500 MG tablet Take 1 tablet (500 mg total) by mouth daily. Please take for 2 weeks 08/22/18   Elgergawy, Leana Roe, MD      Allergies    Avelox [moxifloxacin hcl in nacl], Farxiga [dapagliflozin], and Doxycycline    Review of Systems   Review of Systems  Genitourinary:  Positive for hematuria.  All other systems reviewed and are negative.   Physical Exam Updated Vital Signs BP 135/78 (BP Location: Left Arm)   Pulse 84   Temp 97.8 F (36.6 C) (Oral)   Resp 20   Ht 5\' 11"  (1.803 m)   Wt 134.1 kg  SpO2 99%   BMI 41.23 kg/m  Physical Exam Vitals and nursing note reviewed.   58 year old male, resting comfortably and in no acute distress. Vital signs are normal. Oxygen saturation is 99%, which is normal. Head is normocephalic and atraumatic. PERRLA, EOMI.  Back is nontender and there is no CVA tenderness. Lungs are clear without rales, wheezes, or rhonchi. Chest is nontender. Heart has regular rate and rhythm without murmur. Abdomen is soft, flat, nontender. Extremities have no cyanosis or edema, full range of motion is present. Skin is warm and dry without rash. Neurologic: Mental status is normal, moves all extremities equally.  ED Results / Procedures / Treatments   Labs (all labs ordered  are listed, but only abnormal results are displayed) Labs Reviewed  URINALYSIS, ROUTINE W REFLEX MICROSCOPIC - Abnormal; Notable for the following components:      Result Value   APPearance CLOUDY (*)    Hgb urine dipstick LARGE (*)    Protein, ur 30 (*)    Leukocytes,Ua SMALL (*)    All other components within normal limits  URINALYSIS, MICROSCOPIC (REFLEX) - Abnormal; Notable for the following components:   Bacteria, UA RARE (*)    All other components within normal limits   Procedures Procedures    Medications Ordered in ED Medications  cephALEXin (KEFLEX) capsule 500 mg (has no administration in time range)    ED Course/ Medical Decision Making/ A&P                                 Medical Decision Making Amount and/or Complexity of Data Reviewed Labs: ordered.  Risk Prescription drug management.   Urinary urgency and frequency with hematuria concerning for UTI.  With no abdominal pain or flank pain I have low index of suspicion for urolithiasis.  I have ordered a urinalysis.  I have reviewed his past records, and can find no relevant past visits.  I have reviewed his laboratory tests, and my interpretation is probable UTI.  He has 21-50 WBCs per high-power field but only 6-10 RBCs per high-power field.  Nitrite is negative and there are rare bacteria.  I have ordered an initial dose of cephalexin and I am discharging him with a prescription for cefdinir.  Follow-up with PCP after completing the course of antibiotics.  Return if he develops fever or vomiting or abdominal or flank pain.  Final Clinical Impression(s) / ED Diagnoses Final diagnoses:  Urinary tract infection with hematuria, site unspecified    Rx / DC Orders ED Discharge Orders          Ordered    cefdinir (OMNICEF) 300 MG capsule  2 times daily        03/26/23 0352              Dione Booze, MD 03/26/23 [phone]

## 2023-03-26 NOTE — H&P (Addendum)
[facility] History and Physical  Edwin [name] ZOX:[id number] DOB: 1965-12-20 DOA: 03/26/2023  Referring physician: ED  PCP: Loyola Mast, MD   Patient is coming from: Home  Chief Complaint: Dysuria and hematuria  HPI:  Patient is a 58 years old male with past medical history of diabetes mellitus, hypertension, hyperlipidemia and previous history of renal stone presented [facility] yesterday evening with complaints of sudden onset of dysuria and hematuria.  He was thought to have UTI so was given  cefdinir but had not filled his medication and developed  fever with ongoing hematuria this morning so came back to med Center this morning. Patient also stated some chills and rigor and pain during micturating with passage of some clots.  Patient denies any shortness of breath, chest pain, dyspnea.  Denies any dizziness, nausea, vomiting, abdominal pain except for dysuria.  Denies any changes in his bowel habits.  Denies any sick contacts or recent travel.  He gives history of renal stone in the past.    In the ED, patient was febrile and tachycardic.  Labs were notable for leukocytosis with WBC at 15.5.  Absolute neutrophil was elevated as well.  Lactate was 1.5.  BMP showed sodium of 134 and creatinine 0.8.  Urinalysis was positive for hemoglobin cloudy urine with protein and leukocytes with 11-20 white cells and more than 50 RBCs.  Influenza RSV and COVID was negative.  CT scan of the abdomen pelvis showed bladder wall thickening no definite stones.  Blood cultures and urine cultures were ordered from the ED, patient was given vancomycin and Zosyn and Rocephin and was admitted to the hospital for further evaluation and treatment.    Assessment and Plan Principal Problem:   Sepsis secondary to UTI [facility]) Active Problems:   Controlled type 2 diabetes mellitus without complication, without long-term current use of insulin (HCC)   Hyperlipidemia   Essential hypertension    Fever   Sepsis secondary to acute cystitis  patient had fever, tachycardia and leukocytosis on presentation with abnormal imaging, abnormal urinalysis and hypotension suggestive of sepsis likely secondary to UTI..  Lactate was within normal limit.  Received 2 L of IV fluid bolus in the ED.  Will repeat 500 ml bolus again due to low blood pressure. Will transfer to progressive care unit.  Follow-up blood cultures, urine cultures.  CT scan of the abdomen showing bladder wall thickening.  Temperature max of 102.7 Fahrenheit..  Continue supportive care.  Received vancomycin and Zosyn and Rocephin.  Will continue with Rocephin 2 g daily only, likely urinary source.  Will also get ultrasound of the kidneys.  Continue IV fluid hydration.  Diabetes mellitus type 2 Med rec pending but patient states that he is on metformin and Ozempic at home.  Put the patient on sliding scale insulin while in the hospital.  Previous A1c more than 3 months back was 6.8.  Will repeat A1c again.  Hypertension Hold antihypertensives from home.  Would like was on amlodipine.  Was hypotensive.  Will continue with IV fluid hydration.  Hyperlipidemia On Lipitor at home.  Med rec pending.  Will hold for now.  DVT Prophylaxis: Hold off with Lovenox today, put SCD.  Review of Systems:  All systems were reviewed and were negative unless otherwise mentioned in the HPI   Past Medical History:  Diagnosis Date   Allergy    DM (diabetes mellitus) (HCC)    type 2   High cholesterol    Hypertension  Low testosterone    Past Surgical History:  Procedure Laterality Date   COLONOSCOPY  10 years ago   at [facility] "normal" exam   NASAL SINUS SURGERY  2010   UPPER GASTROINTESTINAL ENDOSCOPY  10 years ago   WISDOM TOOTH EXTRACTION      Social History:  reports that he quit smoking about 17 years ago. His smoking use included cigarettes. He started smoking about 32 years ago. He has a 7.5 pack-year smoking history. He has  never used smokeless tobacco. He reports that he does not drink alcohol and does not use drugs.  Allergies  Allergen Reactions   Avelox [Moxifloxacin Hcl In Nacl] Hives and Itching   Farxiga [Dapagliflozin] Hives   Doxycycline Rash    Family History  Problem Relation Age of Onset   Aneurysm Mother 24       Deceased   Healthy Father        Living   Prostate cancer Paternal Uncle    Healthy Daughter        x3   Colon cancer Neg Hx    Esophageal cancer Neg Hx    Rectal cancer Neg Hx    Stomach cancer Neg Hx      Prior to Admission medications   Medication Sig Start Date End Date Taking? Authorizing Provider  acetaminophen (TYLENOL) 325 MG tablet Take 2 tablets (650 mg total) by mouth every 6 (six) hours as needed for mild pain or headache (fever >/= 101). 08/21/18   Elgergawy, Leana Roe, MD  amLODipine (NORVASC) 5 MG tablet Take 1 tablet (5 mg total) by mouth daily. 10/04/22   Loyola Mast, MD  aspirin EC 81 MG tablet Take 1 tablet by mouth daily.    [provider]  atorvastatin (LIPITOR) 40 MG tablet Take 1 tablet (40 mg total) by mouth daily. 1 daily 12/12/22   Loyola Mast, MD  Blood Glucose Monitoring Suppl (ONETOUCH VERIO FLEX SYSTEM) w/Device KIT 1 Units by Does not apply route daily. Dx:E11.9 03/08/21   Loyola Mast, MD  cefdinir (OMNICEF) 300 MG capsule Take 1 capsule (300 mg total) by mouth 2 (two) times daily. 03/26/23   Dione Booze, MD  clotrimazole-betamethasone (LOTRISONE) cream Apply topically 2 (two) times daily. 03/22/23   Loyola Mast, MD  fluticasone Aleda Grana) 50 MCG/ACT nasal spray Place 2 sprays into both nostrils daily as needed for allergies or rhinitis. 11/16/20   Loyola Mast, MD  glucose blood test strip Use as instructed daily Dx: E11.9 09/19/18   Waldon Merl, PA-C  ibuprofen (ADVIL) 600 MG tablet Take 1 tablet (600 mg total) by mouth every 8 (eight) hours as needed. 01/15/23   Loyola Mast, MD  metFORMIN (GLUCOPHAGE) 1000 MG tablet  Take 1 tablet (1,000 mg total) by mouth 2 (two) times daily with a meal. 06/04/22   Loyola Mast, MD  multivitamin (ONE-A-DAY MEN'S) TABS tablet Take 1 tablet by mouth daily.    [provider]  OneTouch Delica Lancets 33G MISC 1 Stick by Does not apply route daily. Dx:E11.9 03/08/21   Loyola Mast, MD  pioglitazone (ACTOS) 45 MG tablet TAKE 1 TABLET BY MOUTH EVERY DAY 10/01/22   Loyola Mast, MD  Semaglutide, 1 MG/DOSE, (OZEMPIC, 1 MG/DOSE,) 4 MG/3ML SOPN INJECT 1 MG SUBCUTANEOUSLY AS DIRECTED WEEKLY 03/26/23   Loyola Mast, MD  sildenafil (VIAGRA) 100 MG tablet Take 0.5-1 tablets (50-100 mg total) by mouth daily as needed for erectile dysfunction. 01/14/23  Loyola Mast, MD  vitamin C (VITAMIN C) 500 MG tablet Take 1 tablet (500 mg total) by mouth daily. Please take for 2 weeks 08/22/18   Elgergawy, Leana Roe, MD    Physical Exam:  Vitals:   03/26/23 1823 03/26/23 1926 03/26/23 1927 03/26/23 2015  BP:  (!) 99/58 95/61   Pulse:  (!) 128 (!) 128   Resp: (!) 23 (!) 28    Temp:  (!) 102.7 F (39.3 C)  98.9 F (37.2 C)  TempSrc:  Oral  Oral  SpO2:  96% 97%    Wt Readings from Last 3 Encounters:  03/26/23 134.1 kg  03/22/23 134.1 kg  01/15/23 (!) 137 kg   There is no height or weight on file to calculate BMI.  General: Obese built, not in obvious distress HENT: Normocephalic, No scleral pallor or icterus noted. Oral mucosa is moist.  Chest:  Clear breath sounds.  No crackles or wheezes.  CVS: S1 &S2 heard. No murmur.  Regular rate and rhythm. Abdomen: Soft, mild suprapubic area tenderness on palpation, nondistended.  Bowel sounds are heard. No abdominal mass palpated Extremities: No cyanosis, clubbing or edema.  Peripheral pulses are palpable. Psych: Alert, awake and oriented, normal mood CNS:  No cranial nerve deficits.  Power equal in all extremities.   Skin: Warm and dry.  No rashes noted.  Labs on Admission:   CBC: Recent Labs  Lab 03/26/23 1444  WBC  15.5*  NEUTROABS 13.5*  HGB 14.3  HCT 42.6  MCV 79.9*  PLT 277    Basic Metabolic Panel: Recent Labs  Lab 03/26/23 1444  NA 134*  K 3.8  CL 99  CO2 26  GLUCOSE 183*  BUN 10  CREATININE 0.87  CALCIUM 9.1    Liver Function Tests: Recent Labs  Lab 03/26/23 1444  AST 29  ALT 39  ALKPHOS 88  BILITOT 0.8  PROT 8.1  ALBUMIN 4.2   No results for input(s): "LIPASE", "AMYLASE" in the last 168 hours. No results for input(s): "AMMONIA" in the last 168 hours.  Cardiac Enzymes: No results for input(s): "CKTOTAL", "CKMB", "CKMBINDEX", "TROPONINI" in the last 168 hours.  BNP (last 3 results) No results for input(s): "BNP" in the last 8760 hours.  ProBNP (last 3 results) No results for input(s): "PROBNP" in the last 8760 hours.  CBG: Recent Labs  Lab 03/26/23 1929  GLUCAP 187*    Lipase  No results found for: "LIPASE"   Urinalysis    Component Value Date/Time   COLORURINE YELLOW 03/26/2023 1427   APPEARANCEUR CLOUDY (A) 03/26/2023 1427   LABSPEC 1.020 03/26/2023 1427   PHURINE 6.5 03/26/2023 1427   GLUCOSEU NEGATIVE 03/26/2023 1427   GLUCOSEU NEGATIVE 08/23/2022 1150   HGBUR LARGE (A) 03/26/2023 1427   BILIRUBINUR NEGATIVE 03/26/2023 1427   BILIRUBINUR negative 08/03/2014 1026   KETONESUR NEGATIVE 03/26/2023 1427   PROTEINUR 30 (A) 03/26/2023 1427   UROBILINOGEN 0.2 08/23/2022 1150   NITRITE NEGATIVE 03/26/2023 1427   LEUKOCYTESUR SMALL (A) 03/26/2023 1427     Drugs of Abuse  No results found for: "LABOPIA", "COCAINSCRNUR", "LABBENZ", "AMPHETMU", "THCU", "LABBARB"    Radiological Exams on Admission: CT ABDOMEN PELVIS W CONTRAST Result Date: 03/26/2023 CLINICAL DATA:  Hematuria with urinary urgency and frequency. Denies abdominal or flank pain. EXAM: CT ABDOMEN AND PELVIS WITH CONTRAST TECHNIQUE: Multidetector CT imaging of the abdomen and pelvis was performed using the standard protocol following bolus administration of intravenous contrast. RADIATION  DOSE REDUCTION: This exam was  performed according to the departmental dose-optimization program which includes automated exposure control, adjustment of the mA and/or kV according to patient size and/or use of iterative reconstruction technique. CONTRAST:  OMNIPAQUE IOHEXOL 300 MG/ML  SOLN COMPARISON:  None Available. FINDINGS: Lower chest: No acute abnormality. Hepatobiliary: No focal liver abnormality is seen. No gallstones, gallbladder wall thickening, or biliary dilatation. Pancreas: Unremarkable. No pancreatic ductal dilatation or surrounding inflammatory changes. Spleen: Normal in size without focal abnormality. Adrenals/Urinary Tract: The adrenal glands are unremarkable. 1.7 cm high density lesion arising from the mid to lower pole of the left kidney (series 301, image 30), measuring 71 Hounsfield units in density on both the portal venous and delayed phases. There also two simple cysts arising from the left kidney which require no further follow-up. No renal calculi or hydronephrosis. Mild circumferential bladder wall thickening. Stomach/Bowel: Stomach is within normal limits. Appendix appears normal. No evidence of bowel wall thickening, distention, or inflammatory changes. Vascular/Lymphatic: Aortic atherosclerosis. No enlarged abdominal or pelvic lymph nodes. Reproductive: Prostate is unremarkable. Other: Small fat containing left inguinal hernia. No free fluid or pneumoperitoneum. Musculoskeletal: No acute or significant osseous findings. IMPRESSION: 1. Mild circumferential bladder wall thickening, which may reflect cystitis. Correlate with urinalysis. 2. 1.7 cm high density lesion arising from the mid to lower pole of the left kidney, measuring 71 Hounsfield units in density on both the portal venous and delayed phases. This is favored to represent a hemorrhagic or proteinaceous cyst. Recommend further evaluation with renal ultrasound to exclude a solid lesion. 3.  Aortic Atherosclerosis  (ICD10-I70.0). Electronically Signed   By: Obie Dredge M.D.   On: 03/26/2023 16:04   DG Chest Port 1 View Result Date: 03/26/2023 CLINICAL DATA:  Sepsis EXAM: PORTABLE CHEST 1 VIEW COMPARISON:  X-ray 05/18/2022. FINDINGS: Slight linear opacity left lung base likely scar or atelectasis. No consolidation, pneumothorax or effusion. No edema. Normal cardiopericardial silhouette. Azygous fissure. Film is under penetrated. IMPRESSION: Minimal basilar atelectasis or scar.  No consolidation or effusion Electronically Signed   By: Karen Kays M.D.   On: 03/26/2023 15:11    EKG: Personally reviewed by me which shows sinus tachycardia   Consultant: None  Code Status: Full code  Microbiology blood cultures and urine cultures  Antibiotics: Rocephin IV  Family Communication:  Patients' condition and plan of care including tests being ordered have been discussed with the patient and the patient's spouse at bedside who indicate understanding and agree with the plan.   Status is: Inpatient  Severity of Illness: The appropriate patient status for this patient is INPATIENT. Inpatient status is judged to be reasonable and necessary in order to provide the required intensity of service to ensure the patient's safety. The patient's presenting symptoms, physical exam findings, and initial radiographic and laboratory data in the context of their chronic comorbidities is felt to place them at high risk for further clinical deterioration. Furthermore, it is not anticipated that the patient will be medically stable for discharge from the hospital within 2 midnights of admission.   * I certify that at the point of admission it is my clinical judgment that the patient will require inpatient hospital care spanning beyond 2 midnights from the point of admission due to high intensity of service, high risk for further deterioration and high frequency of surveillance required.*  Signed, Joycelyn Das, MD [facility] 03/26/2023

## 2023-03-26 NOTE — Plan of Care (Signed)
  Called for direct admission by Renne Crigler.   H/o DM, HTN, hyperlipidemia. Complained of dysuria and hematuria. Given Rocephin last night in ED and Cefdinir. He has not been able to fill this yet. This morning he developed a fever and came back to the ED. Temp 101.  Influenza, RSV, COVID negative. CT abd/pelvis showed bladder wall thickening. Urine and blood cultures done. Given Rocephin. Tachycardic- EKG shows sinus tachycardia.

## 2023-03-26 NOTE — Progress Notes (Signed)
   03/26/23 2035  Assess: MEWS Score  Temp (!) 100.7 F (38.2 C)  BP (!) 86/56  MAP (mmHg) 67  Pulse Rate (!) 121  Resp 18  SpO2 98 %  O2 Device Room Air  Assess: MEWS Score  MEWS Temp 1  MEWS Systolic 1  MEWS Pulse 2  MEWS RR 0  MEWS LOC 0  MEWS Score 4  MEWS Score Color Red  Assess: if the MEWS score is Yellow or Red  Were vital signs accurate and taken at a resting state? Yes  Does the patient meet 2 or more of the SIRS criteria? No  MEWS guidelines implemented  Yes, red  Treat  MEWS Interventions Considered administering scheduled or prn medications/treatments as ordered  Take Vital Signs  Increase Vital Sign Frequency  Red: Q1hr x2, continue Q4hrs until patient remains green for 12hrs  Escalate  MEWS: Escalate Red: Discuss with charge nurse and notify provider. Consider notifying RRT. If remains red for 2 hours consider need for higher level of care  Notify: Charge Nurse/RN  Name of Charge Nurse/RN Notified Rocco Pauls., RN  Provider Notification  Provider Name/Title Arville Go, NP  Date Provider Notified 03/26/23  Time Provider Notified 2046  Method of Notification  (secure chat)  Notification Reason Other (Comment) (red MEWS)  Provider response See new orders  Date of Provider Response 03/26/23  Time of Provider Response 2100  Assess: SIRS CRITERIA  SIRS Temperature  0  SIRS Respirations  0  SIRS Pulse 1  SIRS WBC 0  SIRS Score Sum  1   Transferred from 5E and Tylenol, vanco, and bolus was given by 5E RN. CN, On call provider made aware regarding pt's condition. Ice packs placed and no blankets on pt. Another bolus infusing now per order. Will continue to monitor.

## 2023-03-26 NOTE — Telephone Encounter (Signed)
Patient was evaluated at ED and given an antibiotic.  He is going going back to be re-evaluated.  Dm/cma

## 2023-03-26 NOTE — ED Provider Notes (Signed)
[place] [facility] Provider Note   CSN: [id number] Arrival date & time: 03/26/23  1338     History  Chief Complaint  Patient presents with   Hematuria    Edwin [name] is a 58 y.o. male.  Patient with history of high cholesterol, diabetes, hypertension, surgical history of bilateral inguinal hernia repair --presents to the emergency department for evaluation of fever and urinary symptoms.  Patient was seen in the emergency department last night with hematuria, dysuria and increasing urgency.  He was given a dose of Rocephin in the ED.  He was given prescription for cefdinir which she has not yet started.  Patient developed fever and chills this morning.  He noted heavier amounts of blood being passed today.  Increasing dysuria.  This prompted recheck.  Found to be febrile on arrival.  No vomiting or diarrhea.  No back pain or flank pain.  Pain with urination, but no rectal pain with defecation.       Home Medications Prior to Admission medications   Medication Sig Start Date End Date Taking? Authorizing Provider  acetaminophen (TYLENOL) 325 MG tablet Take 2 tablets (650 mg total) by mouth every 6 (six) hours as needed for mild pain or headache (fever >/= 101). 08/21/18   Elgergawy, Leana Roe, MD  amLODipine (NORVASC) 5 MG tablet Take 1 tablet (5 mg total) by mouth daily. 10/04/22   Loyola Mast, MD  aspirin EC 81 MG tablet Take 1 tablet by mouth daily.    [provider]  atorvastatin (LIPITOR) 40 MG tablet Take 1 tablet (40 mg total) by mouth daily. 1 daily 12/12/22   Loyola Mast, MD  Blood Glucose Monitoring Suppl (ONETOUCH VERIO FLEX SYSTEM) w/Device KIT 1 Units by Does not apply route daily. Dx:E11.9 03/08/21   Loyola Mast, MD  cefdinir (OMNICEF) 300 MG capsule Take 1 capsule (300 mg total) by mouth 2 (two) times daily. 03/26/23   Dione Booze, MD  clotrimazole-betamethasone (LOTRISONE) cream Apply topically 2 (two) times daily.  03/22/23   Loyola Mast, MD  fluticasone Aleda Grana) 50 MCG/ACT nasal spray Place 2 sprays into both nostrils daily as needed for allergies or rhinitis. 11/16/20   Loyola Mast, MD  glucose blood test strip Use as instructed daily Dx: E11.9 09/19/18   Waldon Merl, PA-C  ibuprofen (ADVIL) 600 MG tablet Take 1 tablet (600 mg total) by mouth every 8 (eight) hours as needed. 01/15/23   Loyola Mast, MD  metFORMIN (GLUCOPHAGE) 1000 MG tablet Take 1 tablet (1,000 mg total) by mouth 2 (two) times daily with a meal. 06/04/22   Loyola Mast, MD  multivitamin (ONE-A-DAY MEN'S) TABS tablet Take 1 tablet by mouth daily.    [provider]  OneTouch Delica Lancets 33G MISC 1 Stick by Does not apply route daily. Dx:E11.9 03/08/21   Loyola Mast, MD  pioglitazone (ACTOS) 45 MG tablet TAKE 1 TABLET BY MOUTH EVERY DAY 10/01/22   Loyola Mast, MD  Semaglutide, 1 MG/DOSE, (OZEMPIC, 1 MG/DOSE,) 4 MG/3ML SOPN INJECT 1 MG SUBCUTANEOUSLY AS DIRECTED WEEKLY 03/26/23   Loyola Mast, MD  sildenafil (VIAGRA) 100 MG tablet Take 0.5-1 tablets (50-100 mg total) by mouth daily as needed for erectile dysfunction. 01/14/23   Loyola Mast, MD  vitamin C (VITAMIN C) 500 MG tablet Take 1 tablet (500 mg total) by mouth daily. Please take for 2 weeks 08/22/18   Elgergawy, Leana Roe, MD  Allergies    Avelox [moxifloxacin hcl in nacl], Farxiga [dapagliflozin], and Doxycycline    Review of Systems   Review of Systems  Physical Exam Updated Vital Signs BP (!) 140/79   Pulse (!) 115   Temp (!) 101 F (38.3 C)   Resp 18   SpO2 99%   Physical Exam Vitals and nursing note reviewed.  Constitutional:      General: He is not in acute distress.    Appearance: He is well-developed.  HENT:     Head: Normocephalic and atraumatic.     Nose: Nose normal.     Mouth/Throat:     Mouth: Mucous membranes are moist.  Eyes:     General:        Right eye: No discharge.        Left eye: No discharge.      Conjunctiva/sclera: Conjunctivae normal.  Cardiovascular:     Rate and Rhythm: Regular rhythm. Tachycardia present.     Heart sounds: Normal heart sounds.  Pulmonary:     Effort: Pulmonary effort is normal.     Breath sounds: Normal breath sounds.  Abdominal:     Palpations: Abdomen is soft.     Tenderness: There is no abdominal tenderness. There is no guarding or rebound.  Musculoskeletal:     Cervical back: Normal range of motion and neck supple.  Skin:    General: Skin is warm and dry.  Neurological:     Mental Status: He is alert.     ED Results / Procedures / Treatments   Labs (all labs ordered are listed, but only abnormal results are displayed) Labs Reviewed  COMPREHENSIVE METABOLIC PANEL - Abnormal; Notable for the following components:      Result Value   Sodium 134 (*)    Glucose, Bld 183 (*)    All other components within normal limits  CBC WITH DIFFERENTIAL/PLATELET - Abnormal; Notable for the following components:   WBC 15.5 (*)    MCV 79.9 (*)    Neutro Abs 13.5 (*)    All other components within normal limits  URINALYSIS, ROUTINE W REFLEX MICROSCOPIC - Abnormal; Notable for the following components:   APPearance CLOUDY (*)    Hgb urine dipstick LARGE (*)    Protein, ur 30 (*)    Leukocytes,Ua SMALL (*)    All other components within normal limits  URINALYSIS, MICROSCOPIC (REFLEX) - Abnormal; Notable for the following components:   Bacteria, UA FEW (*)    All other components within normal limits  RESP PANEL BY RT-PCR (RSV, FLU A&B, COVID)  RVPGX2  CULTURE, BLOOD (ROUTINE X 2)  CULTURE, BLOOD (ROUTINE X 2)  URINE CULTURE  LACTIC ACID, PLASMA  PROTIME-INR  APTT  LACTIC ACID, PLASMA    EKG EKG Interpretation Date/Time:  Tuesday March 26 2023 14:45:26 EST Ventricular Rate:  114 PR Interval:  171 QRS Duration:  80 QT Interval:  321 QTC Calculation: 442 R Axis:   46  Text Interpretation: Sinus tachycardia Confirmed by Virgina Norfolk [phone]) on  03/26/2023 2:47:57 PM  Radiology DG Chest Port 1 View Result Date: 03/26/2023 CLINICAL DATA:  Sepsis EXAM: PORTABLE CHEST 1 VIEW COMPARISON:  X-ray 05/18/2022. FINDINGS: Slight linear opacity left lung base likely scar or atelectasis. No consolidation, pneumothorax or effusion. No edema. Normal cardiopericardial silhouette. Azygous fissure. Film is under penetrated. IMPRESSION: Minimal basilar atelectasis or scar.  No consolidation or effusion Electronically Signed   By: Karen Kays M.D.   On: 03/26/2023 15:11  Procedures Procedures    Medications Ordered in ED Medications  acetaminophen (TYLENOL) tablet 1,000 mg (1,000 mg Oral Given 03/26/23 1458)  cefTRIAXone (ROCEPHIN) 2 g in sodium chloride 0.9 % 100 mL IVPB (2 g Intravenous New Bag/Given 03/26/23 1505)  iohexol (OMNIPAQUE) 300 MG/ML solution 100 mL (125 mLs Intravenous Contrast Given 03/26/23 1539)    ED Course/ Medical Decision Making/ A&P Clinical Course as of 03/26/23 1656  Tue Mar 26, 2023  1625 This is a 58 year old diabetic gentleman presenting back to the ED after being seen last night with dysuria and hematuria, diagnosed and treated for presumptive UTI.  Today he reports he is febrile, extending to have painful urination with bloody clots, feeling unwell.  He was febrile on arrival, tachycardic, white blood cell count elevated 15.5.  Lactate within normal limits.  UA with large hemoglobin, small leukocytes.  Urine culture will be added on.  CT imaging was concerning for bladder wall thickening, and clinically do suspect this is likely cystitis.  Patient does meet SIRS criteria and therefore we will send blood cultures and perform sepsis workup.  He is receiving fluids, Rocephin, and at this point we will admit him to the hospital for blood culture rule out and monitoring.  The patient and his family member at the bedside are in agreement with this plan [MT]    Clinical Course User Index [MT] Trifan, Kermit Balo, MD    Patient  seen and examined. History obtained directly from patient.   Labs/EKG: Ordered CBC, CMP, PT/INR and APTT ordered to evaluate for possibility of endorgan damage secondary to sepsis as part of order set, UA; viral panel to evaluate for respiratory cause as potential etiology of sepsis.  EKG.  Imaging: Ordered chest x-ray, CT abdomen pelvis.  Medications/Fluids: Ordered: IV Rocephin, IV fluid bolus  Most recent vital signs reviewed and are as follows: BP (!) 140/79   Pulse (!) 115   Temp (!) 101 F (38.3 C)   Resp 18   SpO2 99%   Initial impression: Febrile UTI/sepsis  4:56 PM Reassessment performed. Patient appears stable.  Patient discussed with and seen by Dr. Renaye Rakers.   Labs personally reviewed and interpreted including: CBC with elevated white blood cell count of 15.5, normal hemoglobin, neutrophil predominant; CMP with glucose 183 with normal anion gap, normal kidney function; first lactate was normal at 1.5; viral panel negative; PT/INR and APTT were normal; UA with greater than 50 red cells, 11-20 white blood cells, few bacteria, clean-catch.   Imaging personally visualized and interpreted including: CT abdomen and pelvis, agree kidneys appear normal without hydronephrosis, no ureteral stones noted, bladder wall thickening noted, no other obvious source of infection.  Reviewed pertinent lab work and imaging with patient at bedside. Questions answered.   Most current vital signs reviewed and are as follows: BP 133/71   Pulse (!) 115   Temp (!) 100.6 F (38.1 C) (Oral)   Resp 18   SpO2 99%   Plan: Admit to hospital.   I discussed case with Dr. Butler Denmark of [facility] hospitalist by telephone.  Patient will be admitted.  CRITICAL CARE Performed by: Renne Crigler PA-C Total critical care time: 35 minutes Critical care time was exclusive of separately billable procedures and treating other patients. Critical care was necessary to treat or prevent imminent or life-threatening  deterioration. Critical care was time spent personally by me on the following activities: development of treatment plan with patient and/or surrogate as well as nursing, discussions with consultants, evaluation of  patient's response to treatment, examination of patient, obtaining history from patient or surrogate, ordering and performing treatments and interventions, ordering and review of laboratory studies, ordering and review of radiographic studies, pulse oximetry and re-evaluation of patient's condition.                                Medical Decision Making Amount and/or Complexity of Data Reviewed Radiology: ordered.  Risk Prescription drug management. Decision regarding hospitalization.   For this patient's complaint of abdominal pain, the following conditions were considered on the differential diagnosis: gastritis/PUD, enteritis/duodenitis, appendicitis, cholelithiasis/cholecystitis, cholangitis, pancreatitis, ruptured viscus, colitis, diverticulitis, small/large bowel obstruction, proctitis, cystitis, pyelonephritis, ureteral colic, aortic dissection, aortic aneurysm. Atypical chest etiologies were also considered including ACS, PE, and pneumonia.  Irritative UTI symptoms and overall workup suggestive of sepsis due to urinary source.  Symptoms of worsened despite dose of IV Rocephin given in the ED last night.  Patient will be admitted for further treatment and monitoring.           Final Clinical Impression(s) / ED Diagnoses Final diagnoses:  Sepsis without acute organ dysfunction, due to unspecified organism [facility])  Cystitis    Rx / DC Orders ED Discharge Orders     None         Renne Crigler, PA-C 03/26/23 1659    Terald Sleeper, MD 03/26/23 1836

## 2023-03-26 NOTE — Sepsis Progress Note (Signed)
Elink monitoring for the code sepsis protocol.  

## 2023-03-26 NOTE — Telephone Encounter (Signed)
  Chief Complaint: bloody urine Symptoms: passing clots while urinating  Frequency: most times  Disposition: [] ED /[x] Urgent Care (no appt availability in office) / [] Appointment(In office/virtual)/ []  [place] [facility]/ [] Home Care/ [] Refused Recommended Disposition /[] [place] [facility]/ []  Follow-up with PCP Additional Notes: Pt complaining of bloody urine and passing clots at times. This started last night. Pt went to ED last night and was told he had a UTI. Pt was prescribed Omnicef BID. Pt stated the pain is 9/10 and still passing clots. Pt wants a second opinion on UTI diagnosis. Per protocol, pt to be seen today. No PCP appts available and pt refused other [facility] offices. Pt stated he was told by office staff that "he would be squeezed into today, but hasn't heard from office." Pt stated he would go to urgent care to be assessed. RN gave care advice and pt verbalized understanding.           Copied from CRM [id number]. Topic: Clinical - Red Word Triage >> Mar 26, 2023 11:46 AM Thomes Dinning wrote: Red Word that prompted transfer to Nurse Triage: Patient went to the ER and had blood in his urine. He believes he may a UTI due to pain during urination. Reason for Disposition  [1] Pain or burning with passing urine AND [2] side (flank) or back pain present  Answer Assessment - Initial Assessment Questions 1. COLOR of URINE: "Describe the color of the urine."  (e.g., tea-colored, pink, red, bloody) "Do you have blood clots in your urine?" (e.g., none, pea, grape, small coin)     Cloudy with red tint  2. ONSET: "When did the bleeding start?"      Last night  3. EPISODES: "How many times has there been blood in the urine?" or "How many times today?"     20 4. PAIN with URINATION: "Is there any pain with passing your urine?" If Yes, ask: "How bad is the pain?"  (Scale 1-10; or mild, moderate, severe)    - MILD: Complains slightly about urination hurting.    - MODERATE: Interferes with  normal activities.      - SEVERE: Excruciating, unwilling or unable to urinate because of the pain.      moderate 5. FEVER: "Do you have a fever?" If Yes, ask: "What is your temperature, how was it measured, and when did it start?"     unsure 6. ASSOCIATED SYMPTOMS: "Are you passing urine more frequently than usual?"     yes 7. OTHER SYMPTOMS: "Do you have any other symptoms?" (e.g., back/flank pain, abdomen pain, vomiting)     denies  Protocols used: Urine - Blood In-A-AH

## 2023-03-26 NOTE — ED Notes (Signed)
Patient transported to CT 

## 2023-03-26 NOTE — Telephone Encounter (Signed)
Patient called back to let staff know that he would be going back to the ED for evaluation  Reason for Disposition  General information question, no triage required and triager able to answer question  Answer Assessment - Initial Assessment Questions 1. REASON FOR CALL or QUESTION: "What is your reason for calling today?" or "How can I best help you?" or "What question do you have that I can help answer?"     Patient called back to let staff know that he would be going back to the ED  Protocols used: Information Only Call - No Triage-A-AH

## 2023-03-27 ENCOUNTER — Other Ambulatory Visit: Payer: Commercial Managed Care - PPO

## 2023-03-27 DIAGNOSIS — A419 Sepsis, unspecified organism: Secondary | ICD-10-CM | POA: Diagnosis not present

## 2023-03-27 DIAGNOSIS — N390 Urinary tract infection, site not specified: Secondary | ICD-10-CM | POA: Diagnosis not present

## 2023-03-27 LAB — CBC
HCT: 36.9 % — ABNORMAL LOW (ref 39.0–52.0)
Hemoglobin: 12 g/dL — ABNORMAL LOW (ref 13.0–17.0)
MCH: 27 pg (ref 26.0–34.0)
MCHC: 32.5 g/dL (ref 30.0–36.0)
MCV: 82.9 fL (ref 80.0–100.0)
Platelets: 208 10*3/uL (ref 150–400)
RBC: 4.45 MIL/uL (ref 4.22–5.81)
RDW: 13.9 % (ref 11.5–15.5)
WBC: 24.3 10*3/uL — ABNORMAL HIGH (ref 4.0–10.5)
nRBC: 0 % (ref 0.0–0.2)

## 2023-03-27 LAB — BASIC METABOLIC PANEL
Anion gap: 8 (ref 5–15)
BUN: 13 mg/dL (ref 6–20)
CO2: 22 mmol/L (ref 22–32)
Calcium: 8 mg/dL — ABNORMAL LOW (ref 8.9–10.3)
Chloride: 107 mmol/L (ref 98–111)
Creatinine, Ser: 1.07 mg/dL (ref 0.61–1.24)
GFR, Estimated: >60 mL/min (ref >60)
Glucose, Bld: 142 mg/dL — ABNORMAL HIGH (ref 70–99)
Potassium: 3.5 mmol/L (ref 3.5–5.1)
Sodium: 137 mmol/L (ref 135–145)

## 2023-03-27 LAB — GLUCOSE, CAPILLARY
Glucose-Capillary: 123 mg/dL — ABNORMAL HIGH (ref 70–99)
Glucose-Capillary: 131 mg/dL — ABNORMAL HIGH (ref 70–99)
Glucose-Capillary: 111 mg/dL — ABNORMAL HIGH (ref 70–99)
Glucose-Capillary: 155 mg/dL — ABNORMAL HIGH (ref 70–99)

## 2023-03-27 LAB — MAGNESIUM: Magnesium: 1.5 mg/dL — ABNORMAL LOW (ref 1.7–2.4)

## 2023-03-27 LAB — HIV ANTIBODY (ROUTINE TESTING W REFLEX): HIV Screen 4th Generation wRfx: NONREACTIVE

## 2023-03-27 LAB — PHOSPHORUS: Phosphorus: 1.7 mg/dL — ABNORMAL LOW (ref 2.5–4.6)

## 2023-03-27 MED ORDER — POTASSIUM PHOSPHATES 15 MMOLE/5ML IV SOLN
30.0000 mmol | Freq: Once | INTRAVENOUS | Status: AC
  Administered 2023-03-27: 30 mmol via INTRAVENOUS
  Filled 2023-03-27: qty 10

## 2023-03-27 MED ORDER — TRAZODONE HCL 50 MG PO TABS: 50.0000 mg | ORAL_TABLET | Freq: Every evening | ORAL | Status: DC | PRN

## 2023-03-27 MED ORDER — METOPROLOL TARTRATE 5 MG/5ML IV SOLN
5.0000 mg | INTRAVENOUS | Status: DC | PRN
Start: 2023-03-27 — End: 2023-03-29
  Administered 2023-03-27 – 2023-03-28 (×2): 5 mg via INTRAVENOUS
  Filled 2023-03-27 (×2): qty 5

## 2023-03-27 MED ORDER — POTASSIUM CHLORIDE CRYS ER 20 MEQ PO TBCR
40.0000 meq | EXTENDED_RELEASE_TABLET | Freq: Once | ORAL | Status: AC
  Administered 2023-03-27: 40 meq via ORAL
  Filled 2023-03-27: qty 2

## 2023-03-27 MED ORDER — POTASSIUM PHOSPHATES 15 MMOLE/5ML IV SOLN
30.0000 mmol | Freq: Once | INTRAVENOUS | Status: DC
Start: 2023-03-27 — End: 2023-03-27

## 2023-03-27 MED ORDER — GUAIFENESIN 100 MG/5ML PO LIQD
5.0000 mL | ORAL | Status: DC | PRN
  Administered 2023-03-28 – 2023-03-29 (×2): 5 mL via ORAL
  Filled 2023-03-27 (×2): qty 10

## 2023-03-27 MED ORDER — SENNOSIDES-DOCUSATE SODIUM 8.6-50 MG PO TABS: 1.0000 | ORAL_TABLET | Freq: Every evening | ORAL | Status: DC | PRN

## 2023-03-27 MED ORDER — IPRATROPIUM-ALBUTEROL 0.5-2.5 (3) MG/3ML IN SOLN: 3.0000 mL | RESPIRATORY_TRACT | Status: DC | PRN

## 2023-03-27 MED ORDER — SODIUM CHLORIDE 0.9 % IV SOLN
INTRAVENOUS | Status: DC
Start: 2023-03-27 — End: 2023-03-28

## 2023-03-27 MED ORDER — ORAL CARE MOUTH RINSE: 15.0000 mL | OROMUCOSAL | Status: DC | PRN

## 2023-03-27 MED ORDER — HYDRALAZINE HCL 20 MG/ML IJ SOLN: 10.0000 mg | INTRAMUSCULAR | Status: DC | PRN

## 2023-03-27 MED ORDER — MAGNESIUM SULFATE 2 GM/50ML IV SOLN
2.0000 g | Freq: Once | INTRAVENOUS | Status: AC
  Administered 2023-03-27: 2 g via INTRAVENOUS
  Filled 2023-03-27: qty 50

## 2023-03-27 NOTE — Plan of Care (Signed)
  Problem: Education: Goal: Knowledge of General Education information will improve Description: Including pain rating scale, medication(s)/side effects and non-pharmacologic comfort measures Outcome: Progressing   Problem: Clinical Measurements: Goal: Cardiovascular complication will be avoided Outcome: Progressing   Problem: Metabolic: Goal: Ability to maintain appropriate glucose levels will improve Outcome: Progressing

## 2023-03-27 NOTE — Progress Notes (Signed)
PROGRESS NOTE    Edwin [name]  ZHY:[id number] DOB: 02-14-1966 DOA: 03/26/2023 PCP: Loyola Mast, MD    Brief Narrative:  58 year old with history of DM2, HTN, HLD, previous renal stone comes to the hospital with dysuria and hematuria.  He was recently prescribed cefdinir but he has not taken it.  CT of the abdomen pelvis shows bladder wall thickening.   Assessment & Plan:  Principal Problem:   Sepsis secondary to UTI [facility]) Active Problems:   Controlled type 2 diabetes mellitus without complication, without long-term current use of insulin (HCC)   Hyperlipidemia   Essential hypertension   Fever    Sepsis secondary to acute cystitis  Sepsis physiology slowly improving.  CT abdomen pelvis reviewed.  Currently on empiric IV antibiotics.  Renal ultrasound is showing cystic lesions which can be followed up outpatient.  Continue IV fluids   Diabetes mellitus type 2 Home medications on hold.  Accu-Cheks and sliding scale   Hypertension Home medications on hold.  Currently getting fluids   Hyperlipidemia Will resume statin  Hypokalemia/hypomagnesemia/hypophosphatemia - Repletion  DVT prophylaxis: Place and maintain sequential compression device Start: 03/26/23 2033      Code Status: Full Code Family Communication: Spouse at bedside Status is: Inpatient Remains inpatient appropriate because: Continue hospital stay for at least next 24 hours on IV antibiotics    Subjective: Dysuria and hematuria improving   Examination:  General exam: Appears calm and comfortable  Respiratory system: Clear to auscultation. Respiratory effort normal. Cardiovascular system: S1 & S2 heard, RRR. No JVD, murmurs, rubs, gallops or clicks. No pedal edema. Gastrointestinal system: Abdomen is nondistended, soft and nontender. No organomegaly or masses felt. Normal bowel sounds heard. Central nervous system: Alert and oriented. No focal neurological deficits. Extremities: Symmetric 5 x 5  power. Skin: No rashes, lesions or ulcers Psychiatry: Judgement and insight appear normal. Mood & affect appropriate.                Diet Orders (From admission, onward)     Start     Ordered   03/26/23 1959  Diet Carb Modified Fluid consistency: Thin; Room service appropriate? Yes  Diet effective now       Question Answer Comment  Diet-HS Snack? Nothing   Calorie Level Medium 1600-2000   Fluid consistency: Thin   Room service appropriate? Yes      03/26/23 1959            Objective: Vitals:   03/27/23 0500 03/27/23 0547 03/27/23 0632 03/27/23 0849  BP:  (!) 125/99  103/66  Pulse:  (!) 112  100  Resp:  16 16 18   Temp:  99.8 F (37.7 C)  100.2 F (37.9 C)  TempSrc:  Oral  Oral  SpO2:  99%  98%  Weight: 134.8 kg     Height: 5\' 11"  (1.803 m)       Intake/Output Summary (Last 24 hours) at 03/27/2023 1103 Last data filed at 03/27/2023 1007 Gross per 24 hour  Intake 2524.12 ml  Output --  Net 2524.12 ml   Filed Weights   03/27/23 0500  Weight: 134.8 kg    Scheduled Meds:  insulin aspart  0-15 Units Subcutaneous TID WC   insulin aspart  0-5 Units Subcutaneous QHS   sodium chloride flush  3 mL Intravenous Q12H   Continuous Infusions:  sodium chloride 100 mL/hr at 03/27/23 0850   sodium chloride     cefTRIAXone (ROCEPHIN)  IV 2 g (03/27/23 0856)   potassium  PHOSPHATE 30 mmol in dextrose 5 % 250 mL infusion 30 mmol (03/27/23 1007)    Nutritional status     Body mass index is 41.45 kg/m.  Data Reviewed:   CBC: Recent Labs  Lab 03/26/23 1444 03/27/23 0516  WBC 15.5* 24.3*  NEUTROABS 13.5*  --   HGB 14.3 12.0*  HCT 42.6 36.9*  MCV 79.9* 82.9  PLT 277 208   Basic Metabolic Panel: Recent Labs  Lab 03/26/23 1444 03/27/23 0516  NA 134* 137  K 3.8 3.5  CL 99 107  CO2 26 22  GLUCOSE 183* 142*  BUN 10 13  CREATININE 0.87 1.07  CALCIUM 9.1 8.0*  MG  --  1.5*  PHOS  --  1.7*   GFR: Estimated Creatinine Clearance: 106.8 mL/min (by  C-G formula based on SCr of 1.07 mg/dL). Liver Function Tests: Recent Labs  Lab 03/26/23 1444  AST 29  ALT 39  ALKPHOS 88  BILITOT 0.8  PROT 8.1  ALBUMIN 4.2   No results for input(s): "LIPASE", "AMYLASE" in the last 168 hours. No results for input(s): "AMMONIA" in the last 168 hours. Coagulation Profile: Recent Labs  Lab 03/26/23 1444  INR 1.0   Cardiac Enzymes: No results for input(s): "CKTOTAL", "CKMB", "CKMBINDEX", "TROPONINI" in the last 168 hours. BNP (last 3 results) No results for input(s): "PROBNP" in the last 8760 hours. HbA1C: Recent Labs    03/26/23 2121  HGBA1C 7.3*   CBG: Recent Labs  Lab 03/26/23 1929 03/26/23 2141 03/27/23 0739  GLUCAP 187* 153* 123*   Lipid Profile: No results for input(s): "CHOL", "HDL", "LDLCALC", "TRIG", "CHOLHDL", "LDLDIRECT" in the last 72 hours. Thyroid Function Tests: No results for input(s): "TSH", "T4TOTAL", "FREET4", "T3FREE", "THYROIDAB" in the last 72 hours. Anemia Panel: No results for input(s): "VITAMINB12", "FOLATE", "FERRITIN", "TIBC", "IRON", "RETICCTPCT" in the last 72 hours. Sepsis Labs: Recent Labs  Lab 03/26/23 1444 03/26/23 2121  LATICACIDVEN 1.5 2.1*    Recent Results (from the past 240 hours)  Blood Culture (routine x 2)     Status: None (Preliminary result)   Collection Time: 03/26/23  2:26 PM   Specimen: BLOOD  Result Value Ref Range Status   Specimen Description   Final    BLOOD RIGHT ANTECUBITAL Performed at [facility], [address] [place], [place] [address]    Special Requests   Final    BOTTLES DRAWN AEROBIC AND ANAEROBIC Blood Culture adequate volume Performed at [facility], [address] [place], [place] [address]    Culture   Final    NO GROWTH < 24 HOURS Performed at [facility], [address] [place], [place] [address]    Report Status PENDING  Incomplete  Resp panel by RT-PCR (RSV, Flu A&B, Covid) Anterior Nasal Swab     Status: None    Collection Time: 03/26/23  2:44 PM   Specimen: Anterior Nasal Swab  Result Value Ref Range Status   SARS Coronavirus 2 by RT PCR NEGATIVE NEGATIVE Final    Comment: (NOTE) SARS-CoV-2 target nucleic acids are NOT DETECTED.  The SARS-CoV-2 RNA is generally detectable in upper respiratory specimens during the acute phase of infection. The lowest concentration of SARS-CoV-2 viral copies this assay can detect is 138 copies/mL. A negative result does not preclude SARS-Cov-2 infection and should not be used as the sole basis for treatment or other patient management decisions. A negative result may occur with  improper specimen collection/handling, submission of  specimen other than nasopharyngeal swab, presence of viral mutation(s) within the areas targeted by this assay, and inadequate number of viral copies(<138 copies/mL). A negative result must be combined with clinical observations, patient history, and epidemiological information. The expected result is Negative.  Fact Sheet for Patients:  BloggerCourse.com  Fact Sheet for Healthcare Providers:  SeriousBroker.it  This test is no t yet approved or cleared by the [place] FDA and  has been authorized for detection and/or diagnosis of SARS-CoV-2 by FDA under an Emergency Use Authorization (EUA). This EUA will remain  in effect (meaning this test can be used) for the duration of the COVID-19 declaration under Section 564(b)(1) of the Act, 21 U.S.C.section 360bbb-3(b)(1), unless the authorization is terminated  or revoked sooner.       Influenza A by PCR NEGATIVE NEGATIVE Final   Influenza B by PCR NEGATIVE NEGATIVE Final    Comment: (NOTE) The Xpert Xpress SARS-CoV-2/FLU/RSV plus assay is intended as an aid in the diagnosis of influenza from Nasopharyngeal swab specimens and should not be used as a sole basis for treatment. Nasal washings and aspirates are unacceptable for  Xpert Xpress SARS-CoV-2/FLU/RSV testing.  Fact Sheet for Patients: BloggerCourse.com  Fact Sheet for Healthcare Providers: SeriousBroker.it  This test is not yet approved or cleared by the [place] FDA and has been authorized for detection and/or diagnosis of SARS-CoV-2 by FDA under an Emergency Use Authorization (EUA). This EUA will remain in effect (meaning this test can be used) for the duration of the COVID-19 declaration under Section 564(b)(1) of the Act, 21 U.S.C. section 360bbb-3(b)(1), unless the authorization is terminated or revoked.     Resp Syncytial Virus by PCR NEGATIVE NEGATIVE Final    Comment: (NOTE) Fact Sheet for Patients: BloggerCourse.com  Fact Sheet for Healthcare Providers: SeriousBroker.it  This test is not yet approved or cleared by the [place] FDA and has been authorized for detection and/or diagnosis of SARS-CoV-2 by FDA under an Emergency Use Authorization (EUA). This EUA will remain in effect (meaning this test can be used) for the duration of the COVID-19 declaration under Section 564(b)(1) of the Act, 21 U.S.C. section 360bbb-3(b)(1), unless the authorization is terminated or revoked.  Performed at [facility], [address] [place], [place] [address]   Urine Culture     Status: Abnormal (Preliminary result)   Collection Time: 03/26/23  2:44 PM   Specimen: Urine, Clean Catch  Result Value Ref Range Status   Specimen Description   Final    URINE, CLEAN CATCH Performed at [facility], [address] [place], [place] [address]    Special Requests   Final    NONE Performed at [facility], [address] [place], [place] [address]    Culture (A)  Final    30,000 COLONIES/mL ESCHERICHIA COLI SUSCEPTIBILITIES TO FOLLOW Performed at [facility], [address], [place], [place]  [address]    Report Status PENDING  Incomplete  Blood Culture (routine x 2)     Status: None (Preliminary result)   Collection Time: 03/26/23  7:38 PM   Specimen: BLOOD  Result Value Ref Range Status   Specimen Description   Final    BLOOD BLOOD RIGHT HAND Performed at [facility], [address] [place], [place] [address]    Special Requests   Final    BOTTLES DRAWN AEROBIC ONLY Blood Culture results may not be optimal due to an  inadequate volume of blood received in culture bottles Performed at [facility], [address], [place], [place] [address]    Culture   Final    NO GROWTH < 12 HOURS Performed at [facility], [address] [place], [place] [address]    Report Status PENDING  Incomplete         Radiology Studies: US RENAL Result Date: 03/27/2023 CLINICAL DATA:  Hematuria EXAM: RENAL / URINARY TRACT ULTRASOUND COMPLETE COMPARISON:  Same day CT abdomen and pelvis FINDINGS: Right Kidney: Renal measurements: 11.4 x 5.9 x 5.5 cm = volume: 192 mL. Echogenicity within normal limits. No mass or hydronephrosis visualized. Left Kidney: Renal measurements: 12.0 x 6.1 x 6.9 cm = volume: 265 mL. Echogenicity within normal limits. No hydronephrosis. Two cystic lesions along the posterior left kidney measuring 2.0 and 1.6 cm, respectively. The 1.6 cm lesion likely corresponds to the hyperdense lesion seen on CT 03/26/2023. These demonstrate no internal vascularity to suggest solid mass. Bladder: Appears normal for degree of bladder distention. Other: None. IMPRESSION: 1. Two cystic lesions along the posterior left kidney measuring 2.0 and 1.6 cm, respectively. The 1.6 cm lesion likely corresponds to the hyperdense lesion seen on CT 03/26/2023. These demonstrate no internal vascularity to suggest solid mass. Follow-up ultrasound in 6 months is recommended to ensure stability. Electronically Signed   By: Minerva Fester M.D.   On: 03/27/2023 00:19    CT ABDOMEN PELVIS W CONTRAST Result Date: 03/26/2023 CLINICAL DATA:  Hematuria with urinary urgency and frequency. Denies abdominal or flank pain. EXAM: CT ABDOMEN AND PELVIS WITH CONTRAST TECHNIQUE: Multidetector CT imaging of the abdomen and pelvis was performed using the standard protocol following bolus administration of intravenous contrast. RADIATION DOSE REDUCTION: This exam was performed according to the departmental dose-optimization program which includes automated exposure control, adjustment of the mA and/or kV according to patient size and/or use of iterative reconstruction technique. CONTRAST:  OMNIPAQUE IOHEXOL 300 MG/ML  SOLN COMPARISON:  None Available. FINDINGS: Lower chest: No acute abnormality. Hepatobiliary: No focal liver abnormality is seen. No gallstones, gallbladder wall thickening, or biliary dilatation. Pancreas: Unremarkable. No pancreatic ductal dilatation or surrounding inflammatory changes. Spleen: Normal in size without focal abnormality. Adrenals/Urinary Tract: The adrenal glands are unremarkable. 1.7 cm high density lesion arising from the mid to lower pole of the left kidney (series 301, image 30), measuring 71 Hounsfield units in density on both the portal venous and delayed phases. There also two simple cysts arising from the left kidney which require no further follow-up. No renal calculi or hydronephrosis. Mild circumferential bladder wall thickening. Stomach/Bowel: Stomach is within normal limits. Appendix appears normal. No evidence of bowel wall thickening, distention, or inflammatory changes. Vascular/Lymphatic: Aortic atherosclerosis. No enlarged abdominal or pelvic lymph nodes. Reproductive: Prostate is unremarkable. Other: Small fat containing left inguinal hernia. No free fluid or pneumoperitoneum. Musculoskeletal: No acute or significant osseous findings. IMPRESSION: 1. Mild circumferential bladder wall thickening, which may reflect cystitis. Correlate with  urinalysis. 2. 1.7 cm high density lesion arising from the mid to lower pole of the left kidney, measuring 71 Hounsfield units in density on both the portal venous and delayed phases. This is favored to represent a hemorrhagic or proteinaceous cyst. Recommend further evaluation with renal ultrasound to exclude a solid lesion. 3.  Aortic Atherosclerosis (ICD10-I70.0). Electronically Signed   By: Obie Dredge M.D.   On: 03/26/2023 16:04   DG Chest Port 1 View Result Date: 03/26/2023 CLINICAL DATA:  Sepsis EXAM: PORTABLE CHEST 1 VIEW COMPARISON:  X-ray 05/18/2022. FINDINGS: Slight linear opacity left lung base likely scar or atelectasis. No consolidation, pneumothorax or effusion. No edema. Normal cardiopericardial silhouette. Azygous fissure. Film is under penetrated. IMPRESSION: Minimal basilar atelectasis or scar.  No consolidation or effusion Electronically Signed   By: Karen Kays M.D.   On: 03/26/2023 15:11           LOS: 1 day   Time spent= 35 mins    Miguel Rota, MD [facility]  If 7PM-7AM, please contact night-coverage  03/27/2023, 11:03 AM

## 2023-03-27 NOTE — Progress Notes (Signed)
   03/27/23 2006  Assess: MEWS Score  Temp (!) 102.6 F (39.2 C)  BP (!) 146/81  MAP (mmHg) 97  Pulse Rate (!) 119  Resp 20  SpO2 99 %  O2 Device Room Air  Assess: MEWS Score  MEWS Temp 2  MEWS Systolic 0  MEWS Pulse 2  MEWS RR 0  MEWS LOC 0  MEWS Score 4  MEWS Score Color Red  Assess: if the MEWS score is Yellow or Red  Were vital signs accurate and taken at a resting state? Yes  Does the patient meet 2 or more of the SIRS criteria? Yes  Does the patient have a confirmed or suspected source of infection? Yes  MEWS guidelines implemented  No, previously red, continue vital signs every 4 hours  Notify: Charge Nurse/RN  Name of Charge Nurse/RN Notified Joana, RN  Provider Notification  Provider Name/Title Anthoney Harada, NP  Date Provider Notified 03/27/23  Time Provider Notified 2015  Method of Notification  (secure chat)  Notification Reason Change in status (red MEWS)  Assess: SIRS CRITERIA  SIRS Temperature  1  SIRS Respirations  0  SIRS Pulse 1  SIRS WBC 0  SIRS Score Sum  2   Notified attending On Call and CN. PRN Tylenol given and ice packs as well. Will continue to monitor. Pt A&Ox4.  P't urine collected for urine culture around 2040 and sent to lab

## 2023-03-27 NOTE — Progress Notes (Signed)
   03/27/23 0831  TOC Brief Assessment  Insurance and Status Reviewed  Patient has primary care physician Yes  Home environment has been reviewed Resides at home with spouse  Prior level of function: Independent with ADLs at baseline  Prior/Current Home Services No current home services  Social Drivers of Health Review SDOH reviewed no interventions necessary  Readmission risk has been reviewed Yes  Transition of care needs no transition of care needs at this time

## 2023-03-28 DIAGNOSIS — N390 Urinary tract infection, site not specified: Secondary | ICD-10-CM | POA: Diagnosis not present

## 2023-03-28 DIAGNOSIS — A419 Sepsis, unspecified organism: Secondary | ICD-10-CM | POA: Diagnosis not present

## 2023-03-28 LAB — RESPIRATORY PANEL BY PCR

## 2023-03-28 LAB — PHOSPHORUS: Phosphorus: 2.3 mg/dL — ABNORMAL LOW (ref 2.5–4.6)

## 2023-03-28 LAB — URINE CULTURE: Culture: 30000 — AB

## 2023-03-28 LAB — BASIC METABOLIC PANEL
Anion gap: 9 (ref 5–15)
BUN: 10 mg/dL (ref 6–20)
CO2: 20 mmol/L — ABNORMAL LOW (ref 22–32)
Calcium: 8.1 mg/dL — ABNORMAL LOW (ref 8.9–10.3)
Chloride: 109 mmol/L (ref 98–111)
Creatinine, Ser: 1 mg/dL (ref 0.61–1.24)
GFR, Estimated: >60 mL/min (ref >60)
Glucose, Bld: 156 mg/dL — ABNORMAL HIGH (ref 70–99)
Potassium: 4 mmol/L (ref 3.5–5.1)
Sodium: 138 mmol/L (ref 135–145)

## 2023-03-28 LAB — CBC
HCT: 37.2 % — ABNORMAL LOW (ref 39.0–52.0)
Hemoglobin: 12.3 g/dL — ABNORMAL LOW (ref 13.0–17.0)
MCH: 27 pg (ref 26.0–34.0)
MCHC: 33.1 g/dL (ref 30.0–36.0)
MCV: 81.6 fL (ref 80.0–100.0)
Platelets: 218 10*3/uL (ref 150–400)
RBC: 4.56 MIL/uL (ref 4.22–5.81)
RDW: 13.9 % (ref 11.5–15.5)
WBC: 15.6 10*3/uL — ABNORMAL HIGH (ref 4.0–10.5)
nRBC: 0 % (ref 0.0–0.2)

## 2023-03-28 LAB — GLUCOSE, CAPILLARY
Glucose-Capillary: 129 mg/dL — ABNORMAL HIGH (ref 70–99)
Glucose-Capillary: 183 mg/dL — ABNORMAL HIGH (ref 70–99)
Glucose-Capillary: 149 mg/dL — ABNORMAL HIGH (ref 70–99)
Glucose-Capillary: 161 mg/dL — ABNORMAL HIGH (ref 70–99)
Glucose-Capillary: 135 mg/dL — ABNORMAL HIGH (ref 70–99)

## 2023-03-28 LAB — MAGNESIUM: Magnesium: 2.2 mg/dL (ref 1.7–2.4)

## 2023-03-28 MED ORDER — ASPIRIN 81 MG PO TBEC
81.0000 mg | DELAYED_RELEASE_TABLET | Freq: Every day | ORAL | Status: DC
  Administered 2023-03-28 – 2023-03-29 (×2): 81 mg via ORAL
  Filled 2023-03-28 (×2): qty 1

## 2023-03-28 MED ORDER — CEFAZOLIN SODIUM-DEXTROSE 2-4 GM/100ML-% IV SOLN
2.0000 g | Freq: Once | INTRAVENOUS | Status: AC
Start: 2023-03-28 — End: 2023-03-28
  Administered 2023-03-28: 2 g via INTRAVENOUS
  Filled 2023-03-28: qty 100

## 2023-03-28 MED ORDER — AMLODIPINE BESYLATE 5 MG PO TABS
5.0000 mg | ORAL_TABLET | Freq: Every day | ORAL | Status: DC
  Administered 2023-03-28 – 2023-03-29 (×2): 5 mg via ORAL
  Filled 2023-03-28 (×2): qty 1

## 2023-03-28 MED ORDER — K PHOS MONO-SOD PHOS DI & MONO 155-852-130 MG PO TABS
500.0000 mg | ORAL_TABLET | Freq: Once | ORAL | Status: AC
Start: 2023-03-28 — End: 2023-03-28
  Administered 2023-03-28: 500 mg via ORAL
  Filled 2023-03-28: qty 2

## 2023-03-28 MED ORDER — ATORVASTATIN CALCIUM 40 MG PO TABS
40.0000 mg | ORAL_TABLET | Freq: Every day | ORAL | Status: DC
  Administered 2023-03-28 – 2023-03-29 (×2): 40 mg via ORAL
  Filled 2023-03-28 (×2): qty 1

## 2023-03-28 MED ORDER — SODIUM CHLORIDE 0.9 % IV SOLN
INTRAVENOUS | Status: AC
Start: 2023-03-28 — End: 2023-03-29

## 2023-03-28 MED ORDER — CEFAZOLIN SODIUM-DEXTROSE 1-4 GM/50ML-% IV SOLN
1.0000 g | Freq: Three times a day (TID) | INTRAVENOUS | Status: DC
  Administered 2023-03-28 – 2023-03-29 (×3): 1 g via INTRAVENOUS
  Filled 2023-03-28 (×5): qty 50

## 2023-03-28 NOTE — Progress Notes (Signed)
PHARMACY NOTE -  Cefazolin  Pharmacy has been assisting with dosing of cefazolin for UTI. Although imaging shows some unusual features, for now will treat as cystitis with 1g IV q8 hr; should patient fail to improve on this dose, will increase to 2g q8 hr With renal function stable WNL, will sign off of note writing, following peripherally for clinical status changes as above   Bernadene Person, PharmD, BCPS [phone] 03/28/2023, 8:59 AM

## 2023-03-28 NOTE — Plan of Care (Signed)

## 2023-03-28 NOTE — Plan of Care (Signed)
  Problem: Education: Goal: Knowledge of General Education information will improve Description: Including pain rating scale, medication(s)/side effects and non-pharmacologic comfort measures Outcome: Progressing   Problem: Nutrition: Goal: Adequate nutrition will be maintained Outcome: Progressing   Problem: Pain Managment: Goal: General experience of comfort will improve and/or be controlled Outcome: Progressing

## 2023-03-28 NOTE — Progress Notes (Signed)
PROGRESS NOTE    Edwin [name]  ION:[id number] DOB: 03-21-1965 DOA: 03/26/2023 PCP: Loyola Mast, MD    Brief Narrative:  58 year old with history of DM2, HTN, HLD, previous renal stone comes to the hospital with dysuria and hematuria.  He was recently prescribed cefdinir but he has not taken it.  CT of the abdomen pelvis shows bladder wall thickening.  Eventually urine cultures grew E. coli which was pansensitive.   Assessment & Plan:  Principal Problem:   Sepsis secondary to UTI [facility]) Active Problems:   Controlled type 2 diabetes mellitus without complication, without long-term current use of insulin (HCC)   Hyperlipidemia   Essential hypertension   Fever    Sepsis secondary to acute cystitis  Renal ultrasound shows cystic lesion which needs to follow-up outpatient.  Cultures are growing E. coli which are pansensitive.  Unfortunately patient spiked a fever of 102 overnight.  For now we will transition patient from Rocephin to Ancef.  Will check respiratory panel   Diabetes mellitus type 2 Home medications on hold.  Accu-Cheks and sliding scale A1c 7.3   Hypertension Home medications on hold.  Currently getting fluids as needed   Hyperlipidemia Will resume statin  Hypokalemia/hypomagnesemia/hypophosphatemia - Repletion  DVT prophylaxis: Place and maintain sequential compression device Start: 03/26/23 2033      Code Status: Full Code Family Communication: Spouse at bedside Status is: Inpatient Remains inpatient appropriate because: Patient still spiking fever, cultures remain negative.  Continue hospital stay at least for the next 24 hours    Subjective: Seen at bedside, no complaints.  Overnight he spiked a fever of 102.   Examination:  General exam: Appears calm and comfortable  Respiratory system: Clear to auscultation. Respiratory effort normal. Cardiovascular system: S1 & S2 heard, RRR. No JVD, murmurs, rubs, gallops or clicks. No pedal  edema. Gastrointestinal system: Abdomen is nondistended, soft and nontender. No organomegaly or masses felt. Normal bowel sounds heard. Central nervous system: Alert and oriented. No focal neurological deficits. Extremities: Symmetric 5 x 5 power. Skin: No rashes, lesions or ulcers Psychiatry: Judgement and insight appear normal. Mood & affect appropriate.                Diet Orders (From admission, onward)     Start     Ordered   03/26/23 1959  Diet Carb Modified Fluid consistency: Thin; Room service appropriate? Yes  Diet effective now       Question Answer Comment  Diet-HS Snack? Nothing   Calorie Level Medium 1600-2000   Fluid consistency: Thin   Room service appropriate? Yes      03/26/23 1959            Objective: Vitals:   03/28/23 0435 03/28/23 0519 03/28/23 0649 03/28/23 0749  BP:  137/73  124/88  Pulse:  (!) 106  99  Resp:  18  20  Temp:  (!) 100.5 F (38.1 C) 99.5 F (37.5 C) 98.5 F (36.9 C)  TempSrc:  Oral Oral Oral  SpO2:  97%  96%  Weight: 133.9 kg     Height:        Intake/Output Summary (Last 24 hours) at 03/28/2023 0855 Last data filed at 03/28/2023 0400 Gross per 24 hour  Intake 2303.6 ml  Output 215 ml  Net 2088.6 ml   Filed Weights   03/27/23 0500 03/28/23 0435  Weight: 134.8 kg 133.9 kg    Scheduled Meds:  amLODipine  5 mg Oral Daily   aspirin EC  81  mg Oral Daily   atorvastatin  40 mg Oral Daily   insulin aspart  0-15 Units Subcutaneous TID WC   insulin aspart  0-5 Units Subcutaneous QHS   phosphorus  500 mg Oral Once   sodium chloride flush  3 mL Intravenous Q12H   Continuous Infusions:  sodium chloride      ceFAZolin (ANCEF) IV      Nutritional status     Body mass index is 41.16 kg/m.  Data Reviewed:   CBC: Recent Labs  Lab 03/26/23 1444 03/27/23 0516 03/28/23 0450  WBC 15.5* 24.3* 15.6*  NEUTROABS 13.5*  --   --   HGB 14.3 12.0* 12.3*  HCT 42.6 36.9* 37.2*  MCV 79.9* 82.9 81.6  PLT 277 208 218    Basic Metabolic Panel: Recent Labs  Lab 03/26/23 1444 03/27/23 0516 03/28/23 0450  NA 134* 137 138  K 3.8 3.5 4.0  CL 99 107 109  CO2 26 22 20*  GLUCOSE 183* 142* 156*  BUN 10 13 10   CREATININE 0.87 1.07 1.00  CALCIUM 9.1 8.0* 8.1*  MG  --  1.5* 2.2  PHOS  --  1.7* 2.3*   GFR: Estimated Creatinine Clearance: 113.8 mL/min (by C-G formula based on SCr of 1 mg/dL). Liver Function Tests: Recent Labs  Lab 03/26/23 1444  AST 29  ALT 39  ALKPHOS 88  BILITOT 0.8  PROT 8.1  ALBUMIN 4.2   No results for input(s): "LIPASE", "AMYLASE" in the last 168 hours. No results for input(s): "AMMONIA" in the last 168 hours. Coagulation Profile: Recent Labs  Lab 03/26/23 1444  INR 1.0   Cardiac Enzymes: No results for input(s): "CKTOTAL", "CKMB", "CKMBINDEX", "TROPONINI" in the last 168 hours. BNP (last 3 results) No results for input(s): "PROBNP" in the last 8760 hours. HbA1C: Recent Labs    03/26/23 2121  HGBA1C 7.3*   CBG: Recent Labs  Lab 03/27/23 0739 03/27/23 1210 03/27/23 1615 03/27/23 2058 03/28/23 0734  GLUCAP 123* 131* 111* 155* 129*   Lipid Profile: No results for input(s): "CHOL", "HDL", "LDLCALC", "TRIG", "CHOLHDL", "LDLDIRECT" in the last 72 hours. Thyroid Function Tests: No results for input(s): "TSH", "T4TOTAL", "FREET4", "T3FREE", "THYROIDAB" in the last 72 hours. Anemia Panel: No results for input(s): "VITAMINB12", "FOLATE", "FERRITIN", "TIBC", "IRON", "RETICCTPCT" in the last 72 hours. Sepsis Labs: Recent Labs  Lab 03/26/23 1444 03/26/23 2121  LATICACIDVEN 1.5 2.1*    Recent Results (from the past 240 hours)  Blood Culture (routine x 2)     Status: None (Preliminary result)   Collection Time: 03/26/23  2:26 PM   Specimen: BLOOD  Result Value Ref Range Status   Specimen Description   Final    BLOOD RIGHT ANTECUBITAL Performed at [facility], [address] [place], [place] [address]    Special Requests   Final    BOTTLES  DRAWN AEROBIC AND ANAEROBIC Blood Culture adequate volume Performed at [facility], [address] [place], [place] [address]    Culture   Final    NO GROWTH < 24 HOURS Performed at [facility], [address] [place], [place] [address]    Report Status PENDING  Incomplete  Resp panel by RT-PCR (RSV, Flu A&B, Covid) Anterior Nasal Swab     Status: None   Collection Time: 03/26/23  2:44 PM   Specimen: Anterior Nasal Swab  Result Value Ref Range Status   SARS Coronavirus 2 by RT PCR NEGATIVE NEGATIVE Final  Comment: (NOTE) SARS-CoV-2 target nucleic acids are NOT DETECTED.  The SARS-CoV-2 RNA is generally detectable in upper respiratory specimens during the acute phase of infection. The lowest concentration of SARS-CoV-2 viral copies this assay can detect is 138 copies/mL. A negative result does not preclude SARS-Cov-2 infection and should not be used as the sole basis for treatment or other patient management decisions. A negative result may occur with  improper specimen collection/handling, submission of specimen other than nasopharyngeal swab, presence of viral mutation(s) within the areas targeted by this assay, and inadequate number of viral copies(<138 copies/mL). A negative result must be combined with clinical observations, patient history, and epidemiological information. The expected result is Negative.  Fact Sheet for Patients:  BloggerCourse.com  Fact Sheet for Healthcare Providers:  SeriousBroker.it  This test is no t yet approved or cleared by the [place] FDA and  has been authorized for detection and/or diagnosis of SARS-CoV-2 by FDA under an Emergency Use Authorization (EUA). This EUA will remain  in effect (meaning this test can be used) for the duration of the COVID-19 declaration under Section 564(b)(1) of the Act, 21 U.S.C.section 360bbb-3(b)(1), unless the authorization is  terminated  or revoked sooner.       Influenza A by PCR NEGATIVE NEGATIVE Final   Influenza B by PCR NEGATIVE NEGATIVE Final    Comment: (NOTE) The Xpert Xpress SARS-CoV-2/FLU/RSV plus assay is intended as an aid in the diagnosis of influenza from Nasopharyngeal swab specimens and should not be used as a sole basis for treatment. Nasal washings and aspirates are unacceptable for Xpert Xpress SARS-CoV-2/FLU/RSV testing.  Fact Sheet for Patients: BloggerCourse.com  Fact Sheet for Healthcare Providers: SeriousBroker.it  This test is not yet approved or cleared by the [place] FDA and has been authorized for detection and/or diagnosis of SARS-CoV-2 by FDA under an Emergency Use Authorization (EUA). This EUA will remain in effect (meaning this test can be used) for the duration of the COVID-19 declaration under Section 564(b)(1) of the Act, 21 U.S.C. section 360bbb-3(b)(1), unless the authorization is terminated or revoked.     Resp Syncytial Virus by PCR NEGATIVE NEGATIVE Final    Comment: (NOTE) Fact Sheet for Patients: BloggerCourse.com  Fact Sheet for Healthcare Providers: SeriousBroker.it  This test is not yet approved or cleared by the [place] FDA and has been authorized for detection and/or diagnosis of SARS-CoV-2 by FDA under an Emergency Use Authorization (EUA). This EUA will remain in effect (meaning this test can be used) for the duration of the COVID-19 declaration under Section 564(b)(1) of the Act, 21 U.S.C. section 360bbb-3(b)(1), unless the authorization is terminated or revoked.  Performed at [facility], [address] [place], [place] [address]   Urine Culture     Status: Abnormal   Collection Time: 03/26/23  2:44 PM   Specimen: Urine, Clean Catch  Result Value Ref Range Status   Specimen Description   Final    URINE, CLEAN  CATCH Performed at [facility], [address] [place], [place] [address]    Special Requests   Final    NONE Performed at [facility], [address] [place], [place] [address]    Culture 30,000 COLONIES/mL ESCHERICHIA COLI (A)  Final   Report Status 03/28/2023 FINAL  Final   Organism ID, Bacteria ESCHERICHIA COLI (A)  Final      Susceptibility   Escherichia coli - MIC*    AMPICILLIN <=2 SENSITIVE Sensitive  CEFAZOLIN <=4 SENSITIVE Sensitive     CEFEPIME <=0.12 SENSITIVE Sensitive     CEFTRIAXONE <=0.25 SENSITIVE Sensitive     CIPROFLOXACIN <=0.25 SENSITIVE Sensitive     GENTAMICIN <=1 SENSITIVE Sensitive     IMIPENEM <=0.25 SENSITIVE Sensitive     NITROFURANTOIN <=16 SENSITIVE Sensitive     TRIMETH/SULFA <=20 SENSITIVE Sensitive     AMPICILLIN/SULBACTAM <=2 SENSITIVE Sensitive     PIP/TAZO <=4 SENSITIVE Sensitive ug/mL    * 30,000 COLONIES/mL ESCHERICHIA COLI  Blood Culture (routine x 2)     Status: None (Preliminary result)   Collection Time: 03/26/23  7:38 PM   Specimen: BLOOD  Result Value Ref Range Status   Specimen Description   Final    BLOOD BLOOD RIGHT HAND Performed at [facility], [address], [place], [place] [address]    Special Requests   Final    BOTTLES DRAWN AEROBIC ONLY Blood Culture results may not be optimal due to an inadequate volume of blood received in culture bottles Performed at [facility], [address] [place], [place] [address]    Culture   Final    NO GROWTH < 12 HOURS Performed at [facility], [address], [place], [place] [address]    Report Status PENDING  Incomplete         Radiology Studies: US RENAL Result Date: 03/27/2023 CLINICAL DATA:  Hematuria EXAM: RENAL / URINARY TRACT ULTRASOUND COMPLETE COMPARISON:  Same day CT abdomen and pelvis FINDINGS: Right Kidney: Renal measurements: 11.4 x 5.9 x 5.5 cm = volume: 192 mL. Echogenicity within normal  limits. No mass or hydronephrosis visualized. Left Kidney: Renal measurements: 12.0 x 6.1 x 6.9 cm = volume: 265 mL. Echogenicity within normal limits. No hydronephrosis. Two cystic lesions along the posterior left kidney measuring 2.0 and 1.6 cm, respectively. The 1.6 cm lesion likely corresponds to the hyperdense lesion seen on CT 03/26/2023. These demonstrate no internal vascularity to suggest solid mass. Bladder: Appears normal for degree of bladder distention. Other: None. IMPRESSION: 1. Two cystic lesions along the posterior left kidney measuring 2.0 and 1.6 cm, respectively. The 1.6 cm lesion likely corresponds to the hyperdense lesion seen on CT 03/26/2023. These demonstrate no internal vascularity to suggest solid mass. Follow-up ultrasound in 6 months is recommended to ensure stability. Electronically Signed   By: Minerva Fester M.D.   On: 03/27/2023 00:19   CT ABDOMEN PELVIS W CONTRAST Result Date: 03/26/2023 CLINICAL DATA:  Hematuria with urinary urgency and frequency. Denies abdominal or flank pain. EXAM: CT ABDOMEN AND PELVIS WITH CONTRAST TECHNIQUE: Multidetector CT imaging of the abdomen and pelvis was performed using the standard protocol following bolus administration of intravenous contrast. RADIATION DOSE REDUCTION: This exam was performed according to the departmental dose-optimization program which includes automated exposure control, adjustment of the mA and/or kV according to patient size and/or use of iterative reconstruction technique. CONTRAST:  OMNIPAQUE IOHEXOL 300 MG/ML  SOLN COMPARISON:  None Available. FINDINGS: Lower chest: No acute abnormality. Hepatobiliary: No focal liver abnormality is seen. No gallstones, gallbladder wall thickening, or biliary dilatation. Pancreas: Unremarkable. No pancreatic ductal dilatation or surrounding inflammatory changes. Spleen: Normal in size without focal abnormality. Adrenals/Urinary Tract: The adrenal glands are unremarkable. 1.7 cm high  density lesion arising from the mid to lower pole of the left kidney (series 301, image 30), measuring 71 Hounsfield units in density on both the portal venous and delayed phases. There also two simple cysts arising  from the left kidney which require no further follow-up. No renal calculi or hydronephrosis. Mild circumferential bladder wall thickening. Stomach/Bowel: Stomach is within normal limits. Appendix appears normal. No evidence of bowel wall thickening, distention, or inflammatory changes. Vascular/Lymphatic: Aortic atherosclerosis. No enlarged abdominal or pelvic lymph nodes. Reproductive: Prostate is unremarkable. Other: Small fat containing left inguinal hernia. No free fluid or pneumoperitoneum. Musculoskeletal: No acute or significant osseous findings. IMPRESSION: 1. Mild circumferential bladder wall thickening, which may reflect cystitis. Correlate with urinalysis. 2. 1.7 cm high density lesion arising from the mid to lower pole of the left kidney, measuring 71 Hounsfield units in density on both the portal venous and delayed phases. This is favored to represent a hemorrhagic or proteinaceous cyst. Recommend further evaluation with renal ultrasound to exclude a solid lesion. 3.  Aortic Atherosclerosis (ICD10-I70.0). Electronically Signed   By: Obie Dredge M.D.   On: 03/26/2023 16:04   DG Chest Port 1 View Result Date: 03/26/2023 CLINICAL DATA:  Sepsis EXAM: PORTABLE CHEST 1 VIEW COMPARISON:  X-ray 05/18/2022. FINDINGS: Slight linear opacity left lung base likely scar or atelectasis. No consolidation, pneumothorax or effusion. No edema. Normal cardiopericardial silhouette. Azygous fissure. Film is under penetrated. IMPRESSION: Minimal basilar atelectasis or scar.  No consolidation or effusion Electronically Signed   By: Karen Kays M.D.   On: 03/26/2023 15:11           LOS: 2 days   Time spent= 35 mins    Miguel Rota, MD [facility]  If 7PM-7AM, please contact  night-coverage  03/28/2023, 8:55 AM

## 2023-03-29 ENCOUNTER — Telehealth: Payer: Self-pay

## 2023-03-29 DIAGNOSIS — A419 Sepsis, unspecified organism: Secondary | ICD-10-CM | POA: Diagnosis not present

## 2023-03-29 DIAGNOSIS — N390 Urinary tract infection, site not specified: Secondary | ICD-10-CM | POA: Diagnosis not present

## 2023-03-29 LAB — URINE CULTURE: Culture: NO GROWTH

## 2023-03-29 LAB — BASIC METABOLIC PANEL
Anion gap: 8 (ref 5–15)
BUN: 9 mg/dL (ref 6–20)
CO2: 23 mmol/L (ref 22–32)
Calcium: 7.7 mg/dL — ABNORMAL LOW (ref 8.9–10.3)
Chloride: 105 mmol/L (ref 98–111)
Creatinine, Ser: 0.71 mg/dL (ref 0.61–1.24)
GFR, Estimated: >60 mL/min (ref >60)
Glucose, Bld: 134 mg/dL — ABNORMAL HIGH (ref 70–99)
Potassium: 4 mmol/L (ref 3.5–5.1)
Sodium: 136 mmol/L (ref 135–145)

## 2023-03-29 LAB — CBC
HCT: 34.8 % — ABNORMAL LOW (ref 39.0–52.0)
Hemoglobin: 11.7 g/dL — ABNORMAL LOW (ref 13.0–17.0)
MCH: 27 pg (ref 26.0–34.0)
MCHC: 33.6 g/dL (ref 30.0–36.0)
MCV: 80.4 fL (ref 80.0–100.0)
Platelets: 222 10*3/uL (ref 150–400)
RBC: 4.33 MIL/uL (ref 4.22–5.81)
RDW: 14 % (ref 11.5–15.5)
WBC: 8.6 10*3/uL (ref 4.0–10.5)
nRBC: 0 % (ref 0.0–0.2)

## 2023-03-29 LAB — GLUCOSE, CAPILLARY: Glucose-Capillary: 161 mg/dL — ABNORMAL HIGH (ref 70–99)

## 2023-03-29 LAB — MAGNESIUM: Magnesium: 2.3 mg/dL (ref 1.7–2.4)

## 2023-03-29 MED ORDER — CEPHALEXIN 500 MG PO CAPS: 500.0000 mg | ORAL_CAPSULE | Freq: Two times a day (BID) | ORAL | 0 refills | Status: DC

## 2023-03-29 MED ORDER — POLYETHYLENE GLYCOL 3350 17 G PO PACK: 17.0000 g | PACK | Freq: Every day | ORAL | 0 refills | Status: AC | PRN

## 2023-03-29 NOTE — Discharge Summary (Signed)
Physician Discharge Summary   Patient: Edwin [name] MRN: [id number] DOB: 09/18/1965  Admit date:     03/26/2023  Discharge date: 03/29/23  Discharge Physician: Loyce Dys   PCP: Loyola Mast, MD   Recommendations at discharge:   Follow-up with PCP  Discharge Diagnoses:  Sepsis secondary to acute cystitis  Diabetes mellitus type 2 Hypertension Hyperlipidemia Hypokalemia/hypomagnesemia/hypophosphatemia Influenza A infection  Hospital Course: 58 year old with history of DM2, HTN, HLD, previous renal stone comes to the hospital with dysuria and hematuria. He was recently prescribed cefdinir but he has not taken it. CT of the abdomen pelvis shows bladder wall thickening. Eventually urine cultures grew E. coli which was pansensitive.  Patient clinical condition improved.  He spiked temperature and was found to have influenza A infection however asymptomatic.  Patient therefore cleared for discharge today and to follow-up with PCP  Consultants: None Procedures performed: None Disposition: Home Diet recommendation:  Cardiac diet DISCHARGE MEDICATION: Allergies as of 03/29/2023       Reactions   Avelox [moxifloxacin Hcl In Nacl] Hives, Itching   Farxiga [dapagliflozin] Hives   Doxycycline Rash        Medication List     TAKE these medications    acetaminophen 325 MG tablet Commonly known as: TYLENOL Take 2 tablets (650 mg total) by mouth every 6 (six) hours as needed for mild pain or headache (fever >/= 101).   amLODipine 5 MG tablet Commonly known as: NORVASC Take 1 tablet (5 mg total) by mouth daily.   ascorbic acid 500 MG tablet Commonly known as: VITAMIN C Take 1 tablet (500 mg total) by mouth daily. Please take for 2 weeks   aspirin EC 81 MG tablet Take 1 tablet by mouth daily.   atorvastatin 40 MG tablet Commonly known as: LIPITOR Take 1 tablet (40 mg total) by mouth daily. 1 daily   cephALEXin 500 MG capsule Commonly known as: KEFLEX Take 1  capsule (500 mg total) by mouth 2 (two) times daily for 4 days.   clotrimazole-betamethasone cream Commonly known as: LOTRISONE Apply topically 2 (two) times daily.   fluticasone 50 MCG/ACT nasal spray Commonly known as: FLONASE Place 2 sprays into both nostrils daily as needed for allergies or rhinitis.   glucose blood test strip Use as instructed daily Dx: E11.9   ibuprofen 600 MG tablet Commonly known as: ADVIL Take 1 tablet (600 mg total) by mouth every 8 (eight) hours as needed.   metFORMIN 1000 MG tablet Commonly known as: GLUCOPHAGE Take 1 tablet (1,000 mg total) by mouth 2 (two) times daily with a meal.   multivitamin Tabs tablet Take 1 tablet by mouth daily.   OneTouch Delica Lancets 33G Misc 1 Stick by Does not apply route daily. Dx:E11.9   OneTouch Verio Flex System w/Device Kit 1 Units by Does not apply route daily. Dx:E11.9   Ozempic (1 MG/DOSE) 4 MG/3ML Sopn Generic drug: Semaglutide (1 MG/DOSE) INJECT 1 MG SUBCUTANEOUSLY AS DIRECTED WEEKLY What changed:  how much to take how to take this when to take this   polyethylene glycol 17 g packet Commonly known as: MIRALAX / GLYCOLAX Take 17 g by mouth daily as needed for mild constipation.        Discharge Exam: Filed Weights   03/27/23 0500 03/28/23 0435  Weight: 134.8 kg 133.9 kg    General exam: Appears calm and comfortable  Respiratory system: Clear to auscultation. Respiratory effort normal. Cardiovascular system: S1 & S2 heard, RRR. No JVD, murmurs, rubs,  gallops or clicks. No pedal edema. Gastrointestinal system: Abdomen is nondistended, soft and nontender. No organomegaly or masses felt. Normal bowel sounds heard. Central nervous system: Alert and oriented. No focal neurological deficits. Extremities: Symmetric 5 x 5 power. Skin: No rashes, lesions or ulcers Psychiatry: Judgement and insight appear normal. Mood & affect appropriate.    Condition at discharge: good  The results of  significant diagnostics from this hospitalization (including imaging, microbiology, ancillary and laboratory) are listed below for reference.   Imaging Studies: US RENAL Result Date: 03/27/2023 CLINICAL DATA:  Hematuria EXAM: RENAL / URINARY TRACT ULTRASOUND COMPLETE COMPARISON:  Same day CT abdomen and pelvis FINDINGS: Right Kidney: Renal measurements: 11.4 x 5.9 x 5.5 cm = volume: 192 mL. Echogenicity within normal limits. No mass or hydronephrosis visualized. Left Kidney: Renal measurements: 12.0 x 6.1 x 6.9 cm = volume: 265 mL. Echogenicity within normal limits. No hydronephrosis. Two cystic lesions along the posterior left kidney measuring 2.0 and 1.6 cm, respectively. The 1.6 cm lesion likely corresponds to the hyperdense lesion seen on CT 03/26/2023. These demonstrate no internal vascularity to suggest solid mass. Bladder: Appears normal for degree of bladder distention. Other: None. IMPRESSION: 1. Two cystic lesions along the posterior left kidney measuring 2.0 and 1.6 cm, respectively. The 1.6 cm lesion likely corresponds to the hyperdense lesion seen on CT 03/26/2023. These demonstrate no internal vascularity to suggest solid mass. Follow-up ultrasound in 6 months is recommended to ensure stability. Electronically Signed   By: Minerva Fester M.D.   On: 03/27/2023 00:19   CT ABDOMEN PELVIS W CONTRAST Result Date: 03/26/2023 CLINICAL DATA:  Hematuria with urinary urgency and frequency. Denies abdominal or flank pain. EXAM: CT ABDOMEN AND PELVIS WITH CONTRAST TECHNIQUE: Multidetector CT imaging of the abdomen and pelvis was performed using the standard protocol following bolus administration of intravenous contrast. RADIATION DOSE REDUCTION: This exam was performed according to the departmental dose-optimization program which includes automated exposure control, adjustment of the mA and/or kV according to patient size and/or use of iterative reconstruction technique. CONTRAST:  OMNIPAQUE IOHEXOL  300 MG/ML  SOLN COMPARISON:  None Available. FINDINGS: Lower chest: No acute abnormality. Hepatobiliary: No focal liver abnormality is seen. No gallstones, gallbladder wall thickening, or biliary dilatation. Pancreas: Unremarkable. No pancreatic ductal dilatation or surrounding inflammatory changes. Spleen: Normal in size without focal abnormality. Adrenals/Urinary Tract: The adrenal glands are unremarkable. 1.7 cm high density lesion arising from the mid to lower pole of the left kidney (series 301, image 30), measuring 71 Hounsfield units in density on both the portal venous and delayed phases. There also two simple cysts arising from the left kidney which require no further follow-up. No renal calculi or hydronephrosis. Mild circumferential bladder wall thickening. Stomach/Bowel: Stomach is within normal limits. Appendix appears normal. No evidence of bowel wall thickening, distention, or inflammatory changes. Vascular/Lymphatic: Aortic atherosclerosis. No enlarged abdominal or pelvic lymph nodes. Reproductive: Prostate is unremarkable. Other: Small fat containing left inguinal hernia. No free fluid or pneumoperitoneum. Musculoskeletal: No acute or significant osseous findings. IMPRESSION: 1. Mild circumferential bladder wall thickening, which may reflect cystitis. Correlate with urinalysis. 2. 1.7 cm high density lesion arising from the mid to lower pole of the left kidney, measuring 71 Hounsfield units in density on both the portal venous and delayed phases. This is favored to represent a hemorrhagic or proteinaceous cyst. Recommend further evaluation with renal ultrasound to exclude a solid lesion. 3.  Aortic Atherosclerosis (ICD10-I70.0). Electronically Signed   By: Obie Dredge  M.D.   On: 03/26/2023 16:04   DG Chest Port 1 View Result Date: 03/26/2023 CLINICAL DATA:  Sepsis EXAM: PORTABLE CHEST 1 VIEW COMPARISON:  X-ray 05/18/2022. FINDINGS: Slight linear opacity left lung base likely scar or  atelectasis. No consolidation, pneumothorax or effusion. No edema. Normal cardiopericardial silhouette. Azygous fissure. Film is under penetrated. IMPRESSION: Minimal basilar atelectasis or scar.  No consolidation or effusion Electronically Signed   By: Karen Kays M.D.   On: 03/26/2023 15:11    Microbiology: Results for orders placed or performed during the hospital encounter of 03/26/23  Blood Culture (routine x 2)     Status: None (Preliminary result)   Collection Time: 03/26/23  2:26 PM   Specimen: BLOOD  Result Value Ref Range Status   Specimen Description   Final    BLOOD RIGHT ANTECUBITAL Performed at [facility], [address] [place], [place] [address]    Special Requests   Final    BOTTLES DRAWN AEROBIC AND ANAEROBIC Blood Culture adequate volume Performed at [facility], [address] [place], [place] [address]    Culture   Final    NO GROWTH 3 DAYS Performed at [facility], [address], [place], [place] [address]    Report Status PENDING  Incomplete  Resp panel by RT-PCR (RSV, Flu A&B, Covid) Anterior Nasal Swab     Status: None   Collection Time: 03/26/23  2:44 PM   Specimen: Anterior Nasal Swab  Result Value Ref Range Status   SARS Coronavirus 2 by RT PCR NEGATIVE NEGATIVE Final    Comment: (NOTE) SARS-CoV-2 target nucleic acids are NOT DETECTED.  The SARS-CoV-2 RNA is generally detectable in upper respiratory specimens during the acute phase of infection. The lowest concentration of SARS-CoV-2 viral copies this assay can detect is 138 copies/mL. A negative result does not preclude SARS-Cov-2 infection and should not be used as the sole basis for treatment or other patient management decisions. A negative result may occur with  improper specimen collection/handling, submission of specimen other than nasopharyngeal swab, presence of viral mutation(s) within the areas targeted by this assay, and inadequate number of  viral copies(<138 copies/mL). A negative result must be combined with clinical observations, patient history, and epidemiological information. The expected result is Negative.  Fact Sheet for Patients:  BloggerCourse.com  Fact Sheet for Healthcare Providers:  SeriousBroker.it  This test is no t yet approved or cleared by the [place] FDA and  has been authorized for detection and/or diagnosis of SARS-CoV-2 by FDA under an Emergency Use Authorization (EUA). This EUA will remain  in effect (meaning this test can be used) for the duration of the COVID-19 declaration under Section 564(b)(1) of the Act, 21 U.S.C.section 360bbb-3(b)(1), unless the authorization is terminated  or revoked sooner.       Influenza A by PCR NEGATIVE NEGATIVE Final   Influenza B by PCR NEGATIVE NEGATIVE Final    Comment: (NOTE) The Xpert Xpress SARS-CoV-2/FLU/RSV plus assay is intended as an aid in the diagnosis of influenza from Nasopharyngeal swab specimens and should not be used as a sole basis for treatment. Nasal washings and aspirates are unacceptable for Xpert Xpress SARS-CoV-2/FLU/RSV testing.  Fact Sheet for Patients: BloggerCourse.com  Fact Sheet for Healthcare Providers: SeriousBroker.it  This test is not yet approved or cleared by the [place] FDA and has been authorized for detection and/or diagnosis of SARS-CoV-2 by FDA under an Emergency Use Authorization (EUA). This EUA  will remain in effect (meaning this test can be used) for the duration of the COVID-19 declaration under Section 564(b)(1) of the Act, 21 U.S.C. section 360bbb-3(b)(1), unless the authorization is terminated or revoked.     Resp Syncytial Virus by PCR NEGATIVE NEGATIVE Final    Comment: (NOTE) Fact Sheet for Patients: BloggerCourse.com  Fact Sheet for Healthcare  Providers: SeriousBroker.it  This test is not yet approved or cleared by the [place] FDA and has been authorized for detection and/or diagnosis of SARS-CoV-2 by FDA under an Emergency Use Authorization (EUA). This EUA will remain in effect (meaning this test can be used) for the duration of the COVID-19 declaration under Section 564(b)(1) of the Act, 21 U.S.C. section 360bbb-3(b)(1), unless the authorization is terminated or revoked.  Performed at [facility], [address] [place], [place] [address]   Urine Culture     Status: Abnormal   Collection Time: 03/26/23  2:44 PM   Specimen: Urine, Clean Catch  Result Value Ref Range Status   Specimen Description   Final    URINE, CLEAN CATCH Performed at [facility], [address] [place], [place] [address]    Special Requests   Final    NONE Performed at [facility], [address] [place], [place] [address]    Culture 30,000 COLONIES/mL ESCHERICHIA COLI (A)  Final   Report Status 03/28/2023 FINAL  Final   Organism ID, Bacteria ESCHERICHIA COLI (A)  Final      Susceptibility   Escherichia coli - MIC*    AMPICILLIN <=2 SENSITIVE Sensitive     CEFAZOLIN <=4 SENSITIVE Sensitive     CEFEPIME <=0.12 SENSITIVE Sensitive     CEFTRIAXONE <=0.25 SENSITIVE Sensitive     CIPROFLOXACIN <=0.25 SENSITIVE Sensitive     GENTAMICIN <=1 SENSITIVE Sensitive     IMIPENEM <=0.25 SENSITIVE Sensitive     NITROFURANTOIN <=16 SENSITIVE Sensitive     TRIMETH/SULFA <=20 SENSITIVE Sensitive     AMPICILLIN/SULBACTAM <=2 SENSITIVE Sensitive     PIP/TAZO <=4 SENSITIVE Sensitive ug/mL    * 30,000 COLONIES/mL ESCHERICHIA COLI  Blood Culture (routine x 2)     Status: None (Preliminary result)   Collection Time: 03/26/23  7:38 PM   Specimen: BLOOD  Result Value Ref Range Status   Specimen Description   Final    BLOOD BLOOD RIGHT HAND Performed at [facility], [address] [place], [place] [address]    Special Requests   Final    BOTTLES DRAWN AEROBIC ONLY Blood Culture results may not be optimal due to an inadequate volume of blood received in culture bottles Performed at [facility], [address], [place], [place] [address]    Culture   Final    NO GROWTH 3 DAYS Performed at [facility], [address], [place], [place] [address]    Report Status PENDING  Incomplete  Urine Culture (for pregnant, neutropenic or urologic patients or patients with an indwelling urinary catheter)     Status: None   Collection Time: 03/26/23  8:44 PM   Specimen: Urine, Clean Catch  Result Value Ref Range Status   Specimen Description   Final    URINE, CLEAN CATCH Performed at [facility], [address] [place], [place] [address]    Special Requests   Final    NONE Performed at [facility], [address] [place], [place]  [address]    Culture   Final    NO GROWTH Performed at [facility], [address], [place], [place] [address]    Report Status 03/29/2023 FINAL  Final  Respiratory (~20 pathogens) panel by PCR     Status: Abnormal   Collection Time: 03/28/23 10:48 AM   Specimen: Nasopharyngeal Swab; Respiratory  Result Value Ref Range Status   Adenovirus NOT DETECTED NOT DETECTED Final   Coronavirus 229E NOT DETECTED NOT DETECTED Final    Comment: (NOTE) The Coronavirus on the Respiratory Panel, DOES NOT test for the novel  Coronavirus (2019 nCoV)    Coronavirus HKU1 NOT DETECTED NOT DETECTED Final   Coronavirus NL63 NOT DETECTED NOT DETECTED Final   Coronavirus OC43 NOT DETECTED NOT DETECTED Final   Metapneumovirus NOT DETECTED NOT DETECTED Final   Rhinovirus / Enterovirus NOT DETECTED NOT DETECTED Final   Influenza A H3 DETECTED (A) NOT DETECTED Final   Influenza B NOT DETECTED NOT DETECTED Final   Parainfluenza Virus 1 NOT DETECTED NOT DETECTED Final   Parainfluenza  Virus 2 NOT DETECTED NOT DETECTED Final   Parainfluenza Virus 3 NOT DETECTED NOT DETECTED Final   Parainfluenza Virus 4 NOT DETECTED NOT DETECTED Final   Respiratory Syncytial Virus NOT DETECTED NOT DETECTED Final   Bordetella pertussis NOT DETECTED NOT DETECTED Final   Bordetella Parapertussis NOT DETECTED NOT DETECTED Final   Chlamydophila pneumoniae NOT DETECTED NOT DETECTED Final   Mycoplasma pneumoniae NOT DETECTED NOT DETECTED Final    Comment: Performed at [facility], [address], [place], [place] [address]    Labs: CBC: Recent Labs  Lab 03/26/23 1444 03/27/23 0516 03/28/23 0450 03/29/23 0451  WBC 15.5* 24.3* 15.6* 8.6  NEUTROABS 13.5*  --   --   --   HGB 14.3 12.0* 12.3* 11.7*  HCT 42.6 36.9* 37.2* 34.8*  MCV 79.9* 82.9 81.6 80.4  PLT 277 208 218 222   Basic Metabolic Panel: Recent Labs  Lab 03/26/23 1444 03/27/23 0516 03/28/23 0450 03/29/23 0451  NA 134* 137 138 136  K 3.8 3.5 4.0 4.0  CL 99 107 109 105  CO2 26 22 20* 23  GLUCOSE 183* 142* 156* 134*  BUN 10 13 10 9   CREATININE 0.87 1.07 1.00 0.71  CALCIUM 9.1 8.0* 8.1* 7.7*  MG  --  1.5* 2.2 2.3  PHOS  --  1.7* 2.3*  --    Liver Function Tests: Recent Labs  Lab 03/26/23 1444  AST 29  ALT 39  ALKPHOS 88  BILITOT 0.8  PROT 8.1  ALBUMIN 4.2   CBG: Recent Labs  Lab 03/28/23 1127 03/28/23 1647 03/28/23 2007 03/28/23 2240 03/29/23 0736  GLUCAP 183* 149* 161* 135* 161*    Discharge time spent:  .  Signed: Loyce Dys, MD [facility] 03/29/2023

## 2023-03-29 NOTE — Telephone Encounter (Signed)
Copied from CRM [id number]. Topic: General - Other >> Mar 29, 2023  9:32 AM Dimitri Ped wrote: Reason for CRM: patient is calling to speak with doctor or nurse to see if doctor still needed him to come in to get his a1c checked. Patient is being release from hospital today . Patient says they checked his A1c doing stay at hospital   Patient notified VIA phone that he doesn't need to come in for A1C since they did one in the hospital. Dm/cma

## 2023-03-31 LAB — CULTURE, BLOOD (ROUTINE X 2)
Culture: NO GROWTH
Culture: NO GROWTH
Special Requests: ADEQUATE

## 2023-04-01 ENCOUNTER — Encounter: Payer: Self-pay | Admitting: Family Medicine

## 2023-04-01 ENCOUNTER — Telehealth: Payer: Self-pay | Admitting: Family Medicine

## 2023-04-01 ENCOUNTER — Telehealth: Payer: Self-pay

## 2023-04-01 NOTE — Telephone Encounter (Signed)
Spoke to patient on Friday. And advised that no A1C was needed due to having it done in the hospital. Dm/cma

## 2023-04-01 NOTE — Telephone Encounter (Signed)
Copied from CRM [id number]. Topic: General - Other >> Apr 01, 2023  9:46 AM Deaijah H wrote: Reason for CRM: Patient would like Dr. Veto Kemps nurse Angelique Blonder to give him a call / please call  [phone]

## 2023-04-01 NOTE — Telephone Encounter (Signed)
Copied from CRM [id number]. Topic: Appointments - Scheduling Inquiry for Clinic >> Apr 01, 2023 11:10 AM Deaijah H wrote: Reason for CRM: Patient called in looking for same day appointment for an hospital Follow up - due to swelling in leg. Was advised he needs to be seen as soon as possible

## 2023-04-01 NOTE — Transitions of Care (Post Inpatient/ED Visit) (Signed)
04/01/2023  Name: Edwin [name] MRN: [id number] DOB: [birth year]  Today's TOC FU Call Status: Today's TOC FU Call Status:: Successful TOC FU Call Completed TOC FU Call Complete Date: 04/05/23 Patient's Name and Date of Birth confirmed.  Transition Care Management Follow-up Telephone Call Date of Discharge: 03/29/23 Discharge Facility: [facility]) Type of Discharge: Inpatient Admission How have you been since you were released from the hospital?: Better Any questions or concerns?: No  Items Reviewed: Did you receive and understand the discharge instructions provided?: Yes Medications obtained,verified, and reconciled?: Yes (Medications Reviewed) Any new allergies since your discharge?: No Dietary orders reviewed?: NA Do you have support at home?: Yes People in Home: spouse  Medications Reviewed Today: Medications Reviewed Today     Reviewed by Paulita Fujita, Elpidio Anis, CMA (Certified Medical Assistant) on 04/01/23 at 1608  Med List Status: <None>   Medication Order Taking? Sig Documenting Provider Last Dose Status Informant  acetaminophen (TYLENOL) 325 MG tablet [id number] No Take 2 tablets (650 mg total) by mouth every 6 (six) hours as needed for mild pain or headache (fever >/= 101).  Patient not taking: Reported on 04/01/2023   Elgergawy, Leana Roe, MD Not Taking Active Self, Pharmacy Records  amLODipine [facility] 5 MG tablet [id number] Yes Take 1 tablet (5 mg total) by mouth daily. Loyola Mast, MD Taking Active Self, Pharmacy Records  aspirin EC 81 MG tablet [id number] Yes Take 1 tablet by mouth daily. [provider] Taking Active Self, Pharmacy Records           Med Note Verdie Shire   Fri Jul 22, 2015 10:16 AM) Received from: [facility] Received Sig: Take by mouth.  atorvastatin (LIPITOR) 40 MG tablet [id number] Yes Take 1 tablet (40 mg total) by mouth daily. 1 daily Rudd, Bertram Millard, MD Taking Active Self, Pharmacy Records  Blood  Glucose Monitoring Suppl [facility] VERIO FLEX SYSTEM) w/Device KIT [id number] Yes 1 Units by Does not apply route daily. Dx:E11.9 Loyola Mast, MD Taking Active Self, Pharmacy Records  cephALEXin [facility]) 500 MG capsule [id number] Yes Take 1 capsule (500 mg total) by mouth 2 (two) times daily for 4 days. Loyce Dys, MD Taking Active   clotrimazole-betamethasone [facility] Butts) cream [id number] Yes Apply topically 2 (two) times daily. Loyola Mast, MD Taking Active Self, Pharmacy Records  fluticasone [facility]) 50 MCG/ACT nasal spray [id number] Yes Place 2 sprays into both nostrils daily as needed for allergies or rhinitis. Loyola Mast, MD Taking Active Self, Pharmacy Records  glucose blood test strip [id number] Yes Use as instructed daily Dx: E11.9 Waldon Merl, PA-C Taking Active Self, Pharmacy Records  ibuprofen (ADVIL) 600 MG tablet [id number] No Take 1 tablet (600 mg total) by mouth every 8 (eight) hours as needed.  Patient not taking: Reported on 04/01/2023   Loyola Mast, MD Not Taking Active Self, Pharmacy Records           Med Note Sherlon Handing, [place] D   Wed Mar 27, 2023  9:10 AM) unknown  metFORMIN (GLUCOPHAGE) 1000 MG tablet [id number] Yes Take 1 tablet (1,000 mg total) by mouth 2 (two) times daily with a meal. Rudd, Bertram Millard, MD Taking Active Self, Pharmacy Records  multivitamin (ONE-A-DAY MEN'S) TABS tablet [id number] Yes Take 1 tablet by mouth daily. [provider] Taking Active Self, Pharmacy Records  [facility] Lancets 33G [place] [id number] Yes 1 Stick by Does not apply route daily. Dx:E11.9 Loyola Mast, MD Taking Active  Self, Pharmacy Records  polyethylene glycol (MIRALAX / GLYCOLAX) 17 g packet [id number] No Take 17 g by mouth daily as needed for mild constipation.  Patient not taking: Reported on 04/01/2023   Loyce Dys, MD Not Taking Active   Semaglutide, 1 MG/DOSE, (OZEMPIC, 1 MG/DOSE,) 4 MG/3ML SOPN [id number] Yes INJECT 1 MG SUBCUTANEOUSLY AS DIRECTED  WEEKLY  Patient taking differently: Inject 1 mg into the skin once a week. INJECT 1 MG SUBCUTANEOUSLY AS DIRECTED WEEKLY   Rudd, Bertram Millard, MD Taking Active Self, Pharmacy Records  vitamin C (VITAMIN C) 500 MG tablet [id number] Yes Take 1 tablet (500 mg total) by mouth daily. Please take for 2 weeks Elgergawy, Leana Roe, MD Taking Active Self, Pharmacy Records            Home Care and Equipment/Supplies: Were Home Health Services Ordered?: No Any new equipment or medical supplies ordered?: No  Functional Questionnaire: Do you need assistance with bathing/showering or dressing?: No Do you need assistance with meal preparation?: No Do you need assistance with eating?: No Do you have difficulty maintaining continence: No Do you need assistance with getting out of bed/getting out of a chair/moving?: No Do you have difficulty managing or taking your medications?: No  Follow up appointments reviewed: PCP Follow-up appointment confirmed?: Yes Date of PCP follow-up appointment?: 04/05/23 Follow-up Provider: Dr. Veto Kemps [facility] Follow-up appointment confirmed?: NA Do you need transportation to your follow-up appointment?: No Do you understand care options if your condition(s) worsen?: Yes-patient verbalized understanding    SIGNATURE: BMS

## 2023-04-01 NOTE — Telephone Encounter (Signed)
Patient is having swelling on his lip x 3 days.  He will send pictures and upload to [facility] for provider to see.  Dm/cma

## 2023-04-01 NOTE — Telephone Encounter (Signed)
Called patient and advised to take benadryl and not use anymore of the cream on his lips.  Scheduled him an appointment for tomorrow to come in at 8:00 am.  Patient agreeable. Dm/cma

## 2023-04-02 ENCOUNTER — Inpatient Hospital Stay: Payer: Commercial Managed Care - PPO | Admitting: Family Medicine

## 2023-04-02 ENCOUNTER — Encounter: Payer: Self-pay | Admitting: Family Medicine

## 2023-04-02 ENCOUNTER — Ambulatory Visit: Payer: Commercial Managed Care - PPO | Admitting: Family Medicine

## 2023-04-02 VITALS — BP 120/78 | HR 88 | Temp 98.7°F | Ht 71.0 in | Wt 291.4 lb

## 2023-04-02 DIAGNOSIS — T783XXA Angioneurotic edema, initial encounter: Secondary | ICD-10-CM | POA: Diagnosis not present

## 2023-04-02 DIAGNOSIS — B009 Herpesviral infection, unspecified: Secondary | ICD-10-CM | POA: Diagnosis not present

## 2023-04-02 MED ORDER — PREDNISONE 20 MG PO TABS: 40.0000 mg | ORAL_TABLET | Freq: Every day | ORAL | 0 refills | Status: DC

## 2023-04-02 NOTE — Progress Notes (Signed)
[facility] [address] [place] [address] Dept: [phone] Dept Fax: [phone]  Office Visit  Subjective:    Patient ID: Edwin [name], male    DOB: March 10, 1965, 58 y.o..   MRN: [id number]  Chief Complaint  Patient presents with   Lip Laceration    C/o having lip swelling 2 days.      History of Present Illness:  Patient is in today for evaluation of swollen lips. Edwin [name] was recently hospitalized with a UTI and sepsis. He was discharged on Friday evening (1/24). He was discharged on cephalexin. He notes that over the weekend, he developed typical cold sores at the right corner of the mouth and the right upper lip. He began applying Herpecin L to try and relieve the pain associated with these. Starting on Sunday evening/Monday morning, he developed puffy swelling of his lips. He denies any swelling of his tongue or throat.  Past Medical History: Patient Active Problem List   Diagnosis Date Noted   Fever 03/26/2023   Sepsis secondary to UTI (HCC) 03/26/2023   Viral URI with cough 03/22/2023   Intertrigo 03/22/2023   At risk for obstructive sleep apnea 01/03/2022   Plantar fasciitis 01/03/2022   Pain due to onychomycosis of toenails of both feet 01/23/2021   Lymphadenopathy of head and neck 08/31/2020   Class 3 severe obesity due to excess calories with serious comorbidity and body mass index (BMI) of 40.0 to 44.9 in adult (HCC) 08/30/2020   Allergic rhinitis 05/30/2020   Erectile dysfunction 05/30/2020   Eustachian tube dysfunction, left 10/20/2019   Seborrheic keratosis 04/15/2017   PVC's (premature ventricular contractions) 09/03/2014   Essential hypertension 11/03/2013   Controlled type 2 diabetes mellitus without complication, without long-term current use of insulin (HCC) 08/03/2013   Hyperlipidemia 08/03/2013   Low serum testosterone level 08/03/2013   Past Surgical History:  Procedure Laterality Date    COLONOSCOPY  10 years ago   at [facility] "normal" exam   NASAL SINUS SURGERY  2010   UPPER GASTROINTESTINAL ENDOSCOPY  10 years ago   WISDOM TOOTH EXTRACTION     Family History  Problem Relation Age of Onset   Aneurysm Mother 47       Deceased   Healthy Father        Living   Prostate cancer Paternal Uncle    Healthy Daughter        x3   Colon cancer Neg Hx    Esophageal cancer Neg Hx    Rectal cancer Neg Hx    Stomach cancer Neg Hx    Outpatient Medications Prior to Visit  Medication Sig Dispense Refill   amLODipine (NORVASC) 5 MG tablet Take 1 tablet (5 mg total) by mouth daily. 90 tablet 2   aspirin EC 81 MG tablet Take 1 tablet by mouth daily.     atorvastatin (LIPITOR) 40 MG tablet Take 1 tablet (40 mg total) by mouth daily. 1 daily 90 tablet 3   Blood Glucose Monitoring Suppl (ONETOUCH VERIO FLEX SYSTEM) w/Device KIT 1 Units by Does not apply route daily. Dx:E11.9 1 kit 0   clotrimazole-betamethasone (LOTRISONE) cream Apply topically 2 (two) times daily. 30 g 1   fluticasone (FLONASE) 50 MCG/ACT nasal spray Place 2 sprays into both nostrils daily as needed for allergies or rhinitis. 16 g 6   glucose blood test strip Use as instructed daily Dx: E11.9 100 each 3   metFORMIN (GLUCOPHAGE) 1000 MG tablet Take 1  tablet (1,000 mg total) by mouth 2 (two) times daily with a meal. 180 tablet 3   multivitamin (ONE-A-DAY MEN'S) TABS tablet Take 1 tablet by mouth daily.     OneTouch Delica Lancets 33G MISC 1 Stick by Does not apply route daily. Dx:E11.9 100 each 3   polyethylene glycol (MIRALAX / GLYCOLAX) 17 g packet Take 17 g by mouth daily as needed for mild constipation. 14 each 0   Semaglutide, 1 MG/DOSE, (OZEMPIC, 1 MG/DOSE,) 4 MG/3ML SOPN INJECT 1 MG SUBCUTANEOUSLY AS DIRECTED WEEKLY (Patient taking differently: Inject 1 mg into the skin once a week. INJECT 1 MG SUBCUTANEOUSLY AS DIRECTED WEEKLY) 3 mL 11   vitamin C (VITAMIN C) 500 MG tablet Take 1 tablet (500 mg total) by  mouth daily. Please take for 2 weeks     acetaminophen (TYLENOL) 325 MG tablet Take 2 tablets (650 mg total) by mouth every 6 (six) hours as needed for mild pain or headache (fever >/= 101). (Patient not taking: Reported on 04/01/2023)     cephALEXin (KEFLEX) 500 MG capsule Take 1 capsule (500 mg total) by mouth 2 (two) times daily for 4 days. 8 capsule 0   ibuprofen (ADVIL) 600 MG tablet Take 1 tablet (600 mg total) by mouth every 8 (eight) hours as needed. (Patient not taking: Reported on 04/01/2023) 90 tablet 3   No facility-administered medications prior to visit.   Allergies  Allergen Reactions   Avelox [Moxifloxacin Hcl In Nacl] Hives and Itching   Farxiga [Dapagliflozin] Hives   Doxycycline Rash     Objective:   Today's Vitals   04/02/23 0805  BP: 120/78  Pulse: 88  Temp: 98.7 F (37.1 C)  TempSrc: Temporal  SpO2: 99%  Weight: 291 lb 6.4 oz (132.2 kg)  Height: 5\' 11"  (1.803 m)   Body mass index is 40.64 kg/m.   General: Well developed, well nourished. No acute distress. HEENT: Normocephalic, non-traumatic. Right upper and lower lip are both swollen. There are some crusted lesions near the midline of the upper lip and near the right lateral commissure of the lower lip. No swelling noted to the tongue.    Psych: Alert and oriented. Normal mood and affect.  Health Maintenance Due  Topic Date Due   Pneumococcal Vaccine 36-34 Years old (1 of 2 - PCV) Never done   Zoster Vaccines- Shingrix (1 of 2) Never done   OPHTHALMOLOGY EXAM  08/22/2022   FOOT EXAM  01/04/2023     Assessment & Plan:   Problem List Items Addressed This Visit       Other   Angio-edema - Primary   The appearance of the lips is consistent with angioedema. I recommend he stop use of the Hepercin L. He has completed his cephalexin course. It is unclear which agent may have triggered this, though the timing seems more concerning for the topical agent. I recommend he take Benadryl 25 mg q 8 hours for 48  hours. I will prescribe a 5-day course of prednisone. I will see him back, as scheduled, for hospital follow-up on Friday.      Relevant Medications   predniSONE (DELTASONE) 20 MG tablet   HSV-1 infection   Resolving. Manage expectantly for now.       Return for Follow-up as scheduled.   Loyola Mast, MD

## 2023-04-02 NOTE — Assessment & Plan Note (Signed)
The appearance of the lips is consistent with angioedema. I recommend he stop use of the Hepercin L. He has completed his cephalexin course. It is unclear which agent may have triggered this, though the timing seems more concerning for the topical agent. I recommend he take Benadryl 25 mg q 8 hours for 48 hours. I will prescribe a 5-day course of prednisone. I will see him back, as scheduled, for hospital follow-up on Friday.

## 2023-04-02 NOTE — Assessment & Plan Note (Signed)
Resolving. Manage expectantly for now.

## 2023-04-05 ENCOUNTER — Ambulatory Visit: Payer: Commercial Managed Care - PPO | Admitting: Family Medicine

## 2023-04-05 ENCOUNTER — Encounter: Payer: Self-pay | Admitting: Family Medicine

## 2023-04-05 VITALS — BP 120/76 | HR 87 | Temp 97.9°F | Ht 71.0 in | Wt 293.2 lb

## 2023-04-05 DIAGNOSIS — E119 Type 2 diabetes mellitus without complications: Secondary | ICD-10-CM

## 2023-04-05 DIAGNOSIS — I10 Essential (primary) hypertension: Secondary | ICD-10-CM

## 2023-04-05 DIAGNOSIS — Z7984 Long term (current) use of oral hypoglycemic drugs: Secondary | ICD-10-CM

## 2023-04-05 DIAGNOSIS — N390 Urinary tract infection, site not specified: Secondary | ICD-10-CM | POA: Diagnosis not present

## 2023-04-05 DIAGNOSIS — T783XXD Angioneurotic edema, subsequent encounter: Secondary | ICD-10-CM

## 2023-04-05 DIAGNOSIS — A419 Sepsis, unspecified organism: Secondary | ICD-10-CM

## 2023-04-05 DIAGNOSIS — E782 Mixed hyperlipidemia: Secondary | ICD-10-CM

## 2023-04-05 DIAGNOSIS — Z7985 Long-term (current) use of injectable non-insulin antidiabetic drugs: Secondary | ICD-10-CM | POA: Diagnosis not present

## 2023-04-05 LAB — COMPREHENSIVE METABOLIC PANEL
ALT: 98 U/L — ABNORMAL HIGH (ref 0–53)
AST: 22 U/L (ref 0–37)
Albumin: 4 g/dL (ref 3.5–5.2)
Alkaline Phosphatase: 116 U/L (ref 39–117)
BUN: 15 mg/dL (ref 6–23)
CO2: 30 meq/L (ref 19–32)
Calcium: 9 mg/dL (ref 8.4–10.5)
Chloride: 102 meq/L (ref 96–112)
Creatinine, Ser: 1.05 mg/dL (ref 0.40–1.50)
GFR: 78.81 mL/min (ref >60.00)
Glucose, Bld: 189 mg/dL — ABNORMAL HIGH (ref 70–99)
Potassium: 3.8 meq/L (ref 3.5–5.1)
Sodium: 139 meq/L (ref 135–145)
Total Bilirubin: 0.5 mg/dL (ref 0.2–1.2)
Total Protein: 7.2 g/dL (ref 6.0–8.3)

## 2023-04-05 LAB — CBC
HCT: 39.7 % (ref 39.0–52.0)
Hemoglobin: 12.9 g/dL — ABNORMAL LOW (ref 13.0–17.0)
MCHC: 32.4 g/dL (ref 30.0–36.0)
MCV: 82.1 fL (ref 78.0–100.0)
Platelets: 571 10*3/uL — ABNORMAL HIGH (ref 150.0–400.0)
RBC: 4.83 Mil/uL (ref 4.22–5.81)
RDW: 14.1 % (ref 11.5–15.5)
WBC: 11.4 10*3/uL — ABNORMAL HIGH (ref 4.0–10.5)

## 2023-04-05 MED ORDER — PIOGLITAZONE HCL 45 MG PO TABS: 45.0000 mg | ORAL_TABLET | Freq: Every day | ORAL | 3 refills | Status: DC

## 2023-04-05 NOTE — Progress Notes (Signed)
[facility] PRIMARY CARE [facility] [place] [facility] [address] [place] [address] Dept: [phone] Dept Fax: [phone]  Hospital Follow-up Visit  Subjective:    Patient ID: Edwin [name], male    DOB: February 03, 1966, 58 y.o..   MRN: [id number]  Chief Complaint  Patient presents with   Hospitalization Follow-up    Hospital f/u.  Feeling better.    History of Present Illness:  Patient is in today for follow-up from a recent hospitalization. Mr. Bagot was admitted at [facility] from 03/26/2023-03/29/2023 with sepsis secondary to a urinary tract infection. He has completed his antibiotics and is feeling improved.   I had seen Mr. Lloyd earlier this week with angioedema of his lips. We stopped his topical  ointment he was using for an HSV-1 flare. I prescribed Benadryl and a steroid. he notes the lip swelling has resolved.  Mr. Kirker has a history of Type 2 diabetes. He is managed on metformin 1000 mg bid, pioglitazone 45 mg daily, and semaglutide (Ozempic) 1 mg weekly.    Mr. Cumba has a history of hypertension. He is managed on amlodipine 5 mg daily.    Mr. Shellhammer has a history of hyperlipidemia. He is managed on atorvastatin 40 mg daily.  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   Fever 03/26/2023   Sepsis secondary to UTI (HCC) 03/26/2023   Intertrigo 03/22/2023   At risk for obstructive sleep apnea 01/03/2022   Plantar fasciitis 01/03/2022   Pain due to onychomycosis of toenails of both feet 01/23/2021   Lymphadenopathy of head and neck 08/31/2020   Class 3 severe obesity due to excess calories with serious comorbidity and body mass index (BMI) of 40.0 to 44.9 in adult (HCC) 08/30/2020   Allergic rhinitis 05/30/2020   Erectile dysfunction 05/30/2020   Eustachian tube dysfunction, left 10/20/2019   Seborrheic keratosis 04/15/2017   PVC's (premature ventricular  contractions) 09/03/2014   Essential hypertension 11/03/2013   Controlled type 2 diabetes mellitus without complication, without long-term current use of insulin (HCC) 08/03/2013   Hyperlipidemia 08/03/2013   Low serum testosterone level 08/03/2013   Past Surgical History:  Procedure Laterality Date   COLONOSCOPY  10 years ago   at [facility] "normal" exam   NASAL SINUS SURGERY  2010   UPPER GASTROINTESTINAL ENDOSCOPY  10 years ago   WISDOM TOOTH EXTRACTION     Family History  Problem Relation Age of Onset   Aneurysm Mother 16       Deceased   Healthy Father        Living   Prostate cancer Paternal Uncle    Healthy Daughter        x3   Colon cancer Neg Hx    Esophageal cancer Neg Hx    Rectal cancer Neg Hx    Stomach cancer Neg Hx    Outpatient Medications Prior to Visit  Medication Sig Dispense Refill   amLODipine (NORVASC) 5 MG tablet Take 1 tablet (5 mg total) by mouth daily. 90 tablet 2   aspirin EC 81 MG tablet Take 1 tablet by mouth daily.     atorvastatin (LIPITOR) 40 MG tablet Take 1 tablet (40 mg total) by mouth daily. 1 daily 90 tablet 3   Blood Glucose Monitoring Suppl (ONETOUCH VERIO FLEX SYSTEM) w/Device KIT 1 Units by Does not apply route daily. Dx:E11.9 1 kit 0   clotrimazole-betamethasone (LOTRISONE) cream Apply topically 2 (two) times daily.  30 g 1   fluticasone (FLONASE) 50 MCG/ACT nasal spray Place 2 sprays into both nostrils daily as needed for allergies or rhinitis. 16 g 6   glucose blood test strip Use as instructed daily Dx: E11.9 100 each 3   metFORMIN (GLUCOPHAGE) 1000 MG tablet Take 1 tablet (1,000 mg total) by mouth 2 (two) times daily with a meal. 180 tablet 3   multivitamin (ONE-A-DAY MEN'S) TABS tablet Take 1 tablet by mouth daily.     OneTouch Delica Lancets 33G MISC 1 Stick by Does not apply route daily. Dx:E11.9 100 each 3   polyethylene glycol (MIRALAX / GLYCOLAX) 17 g packet Take 17 g by mouth daily as needed for mild constipation. 14 each  0   predniSONE (DELTASONE) 20 MG tablet Take 2 tablets (40 mg total) by mouth daily with breakfast. 10 tablet 0   Semaglutide, 1 MG/DOSE, (OZEMPIC, 1 MG/DOSE,) 4 MG/3ML SOPN INJECT 1 MG SUBCUTANEOUSLY AS DIRECTED WEEKLY (Patient taking differently: Inject 1 mg into the skin once a week. INJECT 1 MG SUBCUTANEOUSLY AS DIRECTED WEEKLY) 3 mL 11   vitamin C (VITAMIN C) 500 MG tablet Take 1 tablet (500 mg total) by mouth daily. Please take for 2 weeks     No facility-administered medications prior to visit.   Allergies  Allergen Reactions   Avelox [Moxifloxacin Hcl In Nacl] Hives and Itching   Farxiga [Dapagliflozin] Hives   Doxycycline Rash     Objective:   Today's Vitals   04/05/23 0907  BP: 120/76  Pulse: 87  Temp: 97.9 F (36.6 C)  TempSrc: Temporal  SpO2: 99%  Weight: 293 lb 3.2 oz (133 kg)  Height: 5\' 11"  (1.803 m)   Body mass index is 40.89 kg/m.   General: Well developed, well nourished. No acute distress. HEENT: Normocephalic, non-traumatic. Lips now appear normal. Feet- Skin intact. No sign of maceration between toes. Nails are normal. Dorsalis pedis and   posterior tibial artery pulses are normal. 5.07 monofilament testing normal. Psych: Alert and oriented. Normal mood and affect.  Health Maintenance Due  Topic Date Due   Pneumococcal Vaccine 60-44 Years old (1 of 2 - PCV) Never done   Zoster Vaccines- Shingrix (1 of 2) Never done   OPHTHALMOLOGY EXAM  08/22/2022   Lab Results Last hemoglobin A1c Lab Results  Component Value Date   HGBA1C 7.3 (H) 03/26/2023     Assessment & Plan:   Problem List Items Addressed This Visit       Cardiovascular and Mediastinum   Essential hypertension   Blood pressure is in good control. Continue amlodipine 5 mg daily.        Endocrine   Type 2 diabetes mellitus without complications (HCC)   Recent A1c was elevated, however, he has had recent illness. Continue metformin 1000 mg bid, pioglitazone 45 mg daily, and  semaglutide (Ozempic) 1 mg weekly. The pioglitazone was removed from his med list during his recent hospitalization. However, I feel he should continue this medicine, so will renew his prescription.      Relevant Medications   pioglitazone (ACTOS) 45 MG tablet     Other   Angio-edema   Resolved.      Hyperlipidemia   At goal. Continue atorvastatin 40 mg daily.      Sepsis secondary to UTI [facility]) - Primary   Resolved.      Relevant Orders   Comprehensive metabolic panel   CBC    Return in about 3 months (around 07/03/2023) for Reassessment.  Loyola Mast, MD

## 2023-04-05 NOTE — Assessment & Plan Note (Signed)
At goal. Continue atorvastatin 40 mg daily.

## 2023-04-05 NOTE — Assessment & Plan Note (Signed)
Blood pressure is in good control. Continue amlodipine 5 mg daily. 

## 2023-04-05 NOTE — Assessment & Plan Note (Signed)
 Resolved

## 2023-04-05 NOTE — Assessment & Plan Note (Signed)
Recent A1c was elevated, however, he has had recent illness. Continue metformin 1000 mg bid, pioglitazone 45 mg daily, and semaglutide (Ozempic) 1 mg weekly. The pioglitazone was removed from his med list during his recent hospitalization. However, I feel he should continue this medicine, so will renew his prescription.

## 2023-04-05 NOTE — Progress Notes (Deleted)
[facility] [address] [place] [address] Dept: [phone] Dept Fax: [phone]  Chronic Care Office Visit  Subjective:    Patient ID: Edwin [name], male    DOB: [birth year], 58 y.o..   MRN: [id number]  Chief Complaint  Patient presents with   Hospitalization Follow-up    Hospital f/u.  Feeling better.    History of Present Illness:  Patient is in today for reassessment of chronic medical issues.  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   Fever 03/26/2023   Sepsis secondary to UTI (HCC) 03/26/2023   Intertrigo 03/22/2023   At risk for obstructive sleep apnea 01/03/2022   Plantar fasciitis 01/03/2022   Pain due to onychomycosis of toenails of both feet 01/23/2021   Lymphadenopathy of head and neck 08/31/2020   Class 3 severe obesity due to excess calories with serious comorbidity and body mass index (BMI) of 40.0 to 44.9 in adult (HCC) 08/30/2020   Allergic rhinitis 05/30/2020   Erectile dysfunction 05/30/2020   Eustachian tube dysfunction, left 10/20/2019   Seborrheic keratosis 04/15/2017   PVC's (premature ventricular contractions) 09/03/2014   Essential hypertension 11/03/2013   Controlled type 2 diabetes mellitus without complication, without long-term current use of insulin (HCC) 08/03/2013   Hyperlipidemia 08/03/2013   Low serum testosterone level 08/03/2013   Past Surgical History:  Procedure Laterality Date   COLONOSCOPY  10 years ago   at [facility] "normal" exam   NASAL SINUS SURGERY  2010   UPPER GASTROINTESTINAL ENDOSCOPY  10 years ago   WISDOM TOOTH EXTRACTION     Family History  Problem Relation Age of Onset   Aneurysm Mother 65       Deceased   Healthy Father        Living   Prostate cancer Paternal Uncle    Healthy Daughter        x3   Colon cancer Neg Hx    Esophageal cancer Neg Hx     Rectal cancer Neg Hx    Stomach cancer Neg Hx    Outpatient Medications Prior to Visit  Medication Sig Dispense Refill   amLODipine (NORVASC) 5 MG tablet Take 1 tablet (5 mg total) by mouth daily. 90 tablet 2   aspirin EC 81 MG tablet Take 1 tablet by mouth daily.     atorvastatin (LIPITOR) 40 MG tablet Take 1 tablet (40 mg total) by mouth daily. 1 daily 90 tablet 3   Blood Glucose Monitoring Suppl (ONETOUCH VERIO FLEX SYSTEM) w/Device KIT 1 Units by Does not apply route daily. Dx:E11.9 1 kit 0   clotrimazole-betamethasone (LOTRISONE) cream Apply topically 2 (two) times daily. 30 g 1   fluticasone (FLONASE) 50 MCG/ACT nasal spray Place 2 sprays into both nostrils daily as needed for allergies or rhinitis. 16 g 6   glucose blood test strip Use as instructed daily Dx: E11.9 100 each 3   metFORMIN (GLUCOPHAGE) 1000 MG tablet Take 1 tablet (1,000 mg total) by mouth 2 (two) times daily with a meal. 180 tablet 3   multivitamin (ONE-A-DAY MEN'S) TABS tablet Take 1 tablet by mouth daily.     OneTouch Delica Lancets 33G MISC 1 Stick by Does not apply route daily. Dx:E11.9 100 each 3   polyethylene glycol (MIRALAX / GLYCOLAX) 17 g packet Take 17 g by mouth daily as needed for mild constipation. 14 each 0   predniSONE (DELTASONE)  20 MG tablet Take 2 tablets (40 mg total) by mouth daily with breakfast. 10 tablet 0   Semaglutide, 1 MG/DOSE, (OZEMPIC, 1 MG/DOSE,) 4 MG/3ML SOPN INJECT 1 MG SUBCUTANEOUSLY AS DIRECTED WEEKLY (Patient taking differently: Inject 1 mg into the skin once a week. INJECT 1 MG SUBCUTANEOUSLY AS DIRECTED WEEKLY) 3 mL 11   vitamin C (VITAMIN C) 500 MG tablet Take 1 tablet (500 mg total) by mouth daily. Please take for 2 weeks     No facility-administered medications prior to visit.   Allergies  Allergen Reactions   Avelox [Moxifloxacin Hcl In Nacl] Hives and Itching   Farxiga [Dapagliflozin] Hives   Doxycycline Rash   Objective:   Today's Vitals   04/05/23 0907  BP: 120/76   Pulse: 87  Temp: 97.9 F (36.6 C)  TempSrc: Temporal  SpO2: 99%  Weight: 293 lb 3.2 oz (133 kg)  Height: 5\' 11"  (1.803 m)   Body mass index is 40.89 kg/m.   General: Well developed, well nourished. No acute distress. HEENT: Normocephalic, non-traumatic. External ears normal. EAC and TMs normal bilaterally. PERRL, EOMI. Conjunctiva clear. Nose   clear without congestion or rhinorrhea. Mucous membranes moist. Oropharynx clear. Good dentition. Neck: Supple. No lymphadenopathy. No thyromegaly. Lungs: Clear to auscultation bilaterally. No wheezing, rales or rhonchi. CV: RRR without murmurs or rubs. Pulses 2+ bilaterally. Abdomen: Soft, non-tender. Bowel sounds positive, normal pitch and frequency. No hepatosplenomegaly. No rebound or guarding. Back: Straight. No CVA tenderness bilaterally. Extremities: Full ROM. No joint swelling or tenderness. No edema noted. Skin: Warm and dry. No rashes. Neuro: CN II-XII intact. Normal sensation and DTR bilaterally. Psych: Alert and oriented. Normal mood and affect.  Health Maintenance Due  Topic Date Due   Pneumococcal Vaccine 55-64 Years old (1 of 2 - PCV) Never done   Zoster Vaccines- Shingrix (1 of 2) Never done   OPHTHALMOLOGY EXAM  08/22/2022    Lab Results {Labs (Optional):29002}    Assessment & Plan:   Problem List Items Addressed This Visit       Cardiovascular and Mediastinum   Essential hypertension     Endocrine   Controlled type 2 diabetes mellitus without complication, without long-term current use of insulin (HCC)     Other   Angio-edema   Hyperlipidemia   Sepsis secondary to UTI [facility]) - Primary   Relevant Orders   Comprehensive metabolic panel   CBC    Return in about 3 months (around 07/03/2023) for Reassessment.   Loyola Mast, MD

## 2023-04-09 NOTE — Telephone Encounter (Signed)
 Copied from CRM [id number]. Topic: Clinical - Medication Question >> Apr 09, 2023 11:16 AM Pinkey ORN wrote: Patient states after reading his lab results in [place], he believes his provider wants him to stop taking this medication but before doing so he is requesting a call back for confirmation. Patient call back number is [phone].   Spoke to patient and advise him to continue taking pioglitazone  (ACTOS ) 45 MG tablet  as dicussed on LOV 04/05/2023. Patient verbalized understanding.

## 2023-04-21 ENCOUNTER — Emergency Department (HOSPITAL_COMMUNITY)
Admission: EM | Admit: 2023-04-21 | Discharge: 2023-04-22 | Disposition: A | Discharge status: Home / Self Care | Payer: Commercial Managed Care - PPO | Attending: Emergency Medicine | Admitting: Emergency Medicine

## 2023-04-21 ENCOUNTER — Encounter (HOSPITAL_COMMUNITY): Payer: Self-pay | Admitting: Emergency Medicine

## 2023-04-21 ENCOUNTER — Other Ambulatory Visit: Payer: Self-pay

## 2023-04-21 DIAGNOSIS — N3090 Cystitis, unspecified without hematuria: Secondary | ICD-10-CM

## 2023-04-21 DIAGNOSIS — N3091 Cystitis, unspecified with hematuria: Secondary | ICD-10-CM | POA: Diagnosis not present

## 2023-04-21 DIAGNOSIS — Z7982 Long term (current) use of aspirin: Secondary | ICD-10-CM | POA: Insufficient documentation

## 2023-04-21 DIAGNOSIS — Z79899 Other long term (current) drug therapy: Secondary | ICD-10-CM | POA: Insufficient documentation

## 2023-04-21 DIAGNOSIS — I10 Essential (primary) hypertension: Secondary | ICD-10-CM | POA: Diagnosis not present

## 2023-04-21 DIAGNOSIS — R319 Hematuria, unspecified: Secondary | ICD-10-CM | POA: Diagnosis present

## 2023-04-21 DIAGNOSIS — Z7984 Long term (current) use of oral hypoglycemic drugs: Secondary | ICD-10-CM | POA: Diagnosis not present

## 2023-04-21 DIAGNOSIS — E119 Type 2 diabetes mellitus without complications: Secondary | ICD-10-CM | POA: Diagnosis not present

## 2023-04-21 DIAGNOSIS — R310 Gross hematuria: Secondary | ICD-10-CM

## 2023-04-21 NOTE — ED Triage Notes (Incomplete)
Patient c/o hematuria x 1 day.. Patient report discomfort and urinary frequency. Patient denies N/V. Patient denies fever

## 2023-04-22 ENCOUNTER — Telehealth: Payer: Self-pay

## 2023-04-22 LAB — BASIC METABOLIC PANEL
Anion gap: 10 (ref 5–15)
BUN: 13 mg/dL (ref 6–20)
CO2: 25 mmol/L (ref 22–32)
Calcium: 9.3 mg/dL (ref 8.9–10.3)
Chloride: 104 mmol/L (ref 98–111)
Creatinine, Ser: 1.07 mg/dL (ref 0.61–1.24)
GFR, Estimated: >60 mL/min (ref >60)
Glucose, Bld: 182 mg/dL — ABNORMAL HIGH (ref 70–99)
Potassium: 3.9 mmol/L (ref 3.5–5.1)
Sodium: 139 mmol/L (ref 135–145)

## 2023-04-22 LAB — URINALYSIS, ROUTINE W REFLEX MICROSCOPIC
Bilirubin Urine: NEGATIVE
Glucose, UA: NEGATIVE mg/dL
Ketones, ur: NEGATIVE mg/dL
Nitrite: NEGATIVE
Protein, ur: 100 mg/dL — AB
RBC / HPF: >50 RBC/hpf (ref 0–5)
Specific Gravity, Urine: 1.004 — ABNORMAL LOW (ref 1.005–1.030)
WBC, UA: >50 WBC/hpf (ref 0–5)
pH: 6 (ref 5.0–8.0)

## 2023-04-22 LAB — CBC
HCT: 40.3 % (ref 39.0–52.0)
Hemoglobin: 13 g/dL (ref 13.0–17.0)
MCH: 26.7 pg (ref 26.0–34.0)
MCHC: 32.3 g/dL (ref 30.0–36.0)
MCV: 82.9 fL (ref 80.0–100.0)
Platelets: 325 10*3/uL (ref 150–400)
RBC: 4.86 MIL/uL (ref 4.22–5.81)
RDW: 14.5 % (ref 11.5–15.5)
WBC: 10.5 10*3/uL (ref 4.0–10.5)
nRBC: 0 % (ref 0.0–0.2)

## 2023-04-22 MED ORDER — ONDANSETRON 4 MG PO TBDP: 4.0000 mg | ORAL_TABLET | Freq: Three times a day (TID) | ORAL | 0 refills | Status: AC | PRN

## 2023-04-22 MED ORDER — CEPHALEXIN 500 MG PO CAPS: 500.0000 mg | ORAL_CAPSULE | Freq: Three times a day (TID) | ORAL | 0 refills | Status: AC

## 2023-04-22 MED ORDER — CEPHALEXIN 500 MG PO CAPS
500.0000 mg | ORAL_CAPSULE | Freq: Once | ORAL | Status: AC
  Administered 2023-04-22: 500 mg via ORAL
  Filled 2023-04-22: qty 1

## 2023-04-22 NOTE — ED Notes (Signed)
Provided patient and family with beverages at this time.

## 2023-04-22 NOTE — Transitions of Care (Post Inpatient/ED Visit) (Signed)
   04/22/2023  Name: Edwin [name] MRN: [id number] DOB: May 16, 1965  Today's TOC FU Call Status: Today's TOC FU Call Status:: Successful TOC FU Call Completed TOC FU Call Complete Date: 04/22/23 Patient's Name and Date of Birth confirmed.  Transition Care Management Follow-up Telephone Call Date of Discharge: 04/21/23 Discharge Facility: [facility]) Type of Discharge: Emergency Department Reason for ED Visit: Other: How have you been since you were released from the hospital?: Better Any questions or concerns?: No  Items Reviewed: Did you receive and understand the discharge instructions provided?: Yes Medications obtained,verified, and reconciled?: Yes (Medications Reviewed) Any new allergies since your discharge?: No Dietary orders reviewed?: NA Do you have support at home?: Yes People in Home: spouse  Medications Reviewed Today: Medications Reviewed Today   Medications were not reviewed in this encounter     Home Care and Equipment/Supplies: Any new equipment or medical supplies ordered?: NA  Functional Questionnaire: Do you need assistance with bathing/showering or dressing?: No Do you need assistance with meal preparation?: No Do you need assistance with eating?: No Do you have difficulty maintaining continence: No Do you need assistance with getting out of bed/getting out of a chair/moving?: No Do you have difficulty managing or taking your medications?: No  Follow up appointments reviewed: PCP Follow-up appointment confirmed?: No MD Provider Line Number:[phone] Given: Yes Specialist Hospital Follow-up appointment confirmed?: No Reason Specialist Follow-Up Not Confirmed: Patient has Specialist Provider Number and will Call for Appointment Do you need transportation to your follow-up appointment?: No Do you understand care options if your condition(s) worsen?: Yes-patient verbalized understanding    SIGNATURE Wyona Neils D, CMA

## 2023-04-22 NOTE — ED Provider Notes (Signed)
[place] EMERGENCY DEPARTMENT AT [facility] Provider Note  CSN: [id number] Arrival date & time: 04/21/23 2340  Chief Complaint(s) Hematuria  HPI Edwin [name] is a 58 y.o. male     Hematuria This is a recurrent problem. The current episode started 3 to 5 hours ago. The problem occurs constantly. The problem has not changed since onset.Pertinent negatives include no chest pain, no abdominal pain, no headaches and no shortness of breath. Nothing aggravates the symptoms. Nothing relieves the symptoms. He has tried nothing for the symptoms.    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   Fever 03/26/2023   Sepsis secondary to UTI (HCC) 03/26/2023   Intertrigo 03/22/2023   At risk for obstructive sleep apnea 01/03/2022   Plantar fasciitis 01/03/2022   Pain due to onychomycosis of toenails of both feet 01/23/2021   Lymphadenopathy of head and neck 08/31/2020   Class 3 severe obesity due to excess calories with serious comorbidity and body mass index (BMI) of 40.0 to 44.9 in adult [facility]) 08/30/2020   Allergic rhinitis 05/30/2020   Erectile dysfunction 05/30/2020   Eustachian tube dysfunction, left 10/20/2019   Seborrheic keratosis 04/15/2017   PVC's (premature ventricular contractions) 09/03/2014   Essential hypertension 11/03/2013   Type 2 diabetes mellitus without complications (HCC) 08/03/2013   Hyperlipidemia 08/03/2013   Low serum testosterone level 08/03/2013   Home Medication(s) Prior to Admission medications   Medication Sig Start Date End Date Taking? Authorizing Provider  cephALEXin (KEFLEX) 500 MG capsule Take 1 capsule (500 mg total) by mouth 3 (three) times daily for 10 days. 04/22/23 05/02/23 Yes Garl Speigner, Amadeo Garnet,  MD  ondansetron (ZOFRAN-ODT) 4 MG disintegrating tablet Take 1 tablet (4 mg total) by mouth every 8 (eight) hours as needed for up to 3 days for nausea or vomiting. 04/22/23 04/25/23 Yes Shirley Decamp, Amadeo Garnet, MD  amLODipine (NORVASC) 5 MG tablet Take 1 tablet (5 mg total) by mouth daily. 10/04/22   Loyola Mast, MD  aspirin EC 81 MG tablet Take 1 tablet by mouth daily.    [provider]  atorvastatin (LIPITOR) 40 MG tablet Take 1 tablet (40 mg total) by mouth daily. 1 daily 12/12/22   Loyola Mast, MD  Blood Glucose Monitoring Suppl (ONETOUCH VERIO FLEX SYSTEM) w/Device KIT 1 Units by Does not apply route daily. Dx:E11.9 03/08/21   Loyola Mast, MD  clotrimazole-betamethasone (LOTRISONE) cream Apply topically 2 (two) times daily. 03/22/23   Loyola Mast, MD  fluticasone Aleda Grana) 50 MCG/ACT nasal spray Place 2 sprays into both nostrils daily as needed for allergies or rhinitis. 11/16/20   Loyola Mast, MD  glucose blood test strip Use as instructed daily Dx: E11.9 09/19/18   Waldon Merl, PA-C  metFORMIN (GLUCOPHAGE) 1000 MG tablet Take 1 tablet (1,000 mg total) by mouth 2 (two) times daily with a meal. 06/04/22   Loyola Mast, MD  multivitamin (ONE-A-DAY MEN'S) TABS tablet Take 1 tablet by mouth daily.    [provider]  OneTouch Delica Lancets 33G MISC 1 Stick by Does not apply route daily. Dx:E11.9 03/08/21   Loyola Mast, MD  pioglitazone (ACTOS) 45 MG tablet Take 1 tablet (45  mg total) by mouth daily. 04/05/23   Loyola Mast, MD  polyethylene glycol (MIRALAX / GLYCOLAX) 17 g packet Take 17 g by mouth daily as needed for mild constipation. 03/29/23   Loyce Dys, MD  predniSONE (DELTASONE) 20 MG tablet Take 2 tablets (40 mg total) by mouth daily with breakfast. 04/02/23   Loyola Mast, MD  Semaglutide, 1 MG/DOSE, (OZEMPIC, 1 MG/DOSE,) 4 MG/3ML SOPN INJECT 1 MG SUBCUTANEOUSLY AS DIRECTED WEEKLY Patient taking differently: Inject 1 mg into the skin once a  week. INJECT 1 MG SUBCUTANEOUSLY AS DIRECTED WEEKLY 03/26/23   Loyola Mast, MD  vitamin C (VITAMIN C) 500 MG tablet Take 1 tablet (500 mg total) by mouth daily. Please take for 2 weeks 08/22/18   Elgergawy, Leana Roe, MD                                                                                                                                    Allergies Avelox [moxifloxacin hcl in nacl], Farxiga [dapagliflozin], and Doxycycline  Review of Systems Review of Systems  Respiratory:  Negative for shortness of breath.   Cardiovascular:  Negative for chest pain.  Gastrointestinal:  Negative for abdominal pain.  Genitourinary:  Positive for dysuria, frequency and hematuria. Negative for difficulty urinating, flank pain, penile pain, penile swelling and testicular pain.  Neurological:  Negative for headaches.   As noted in HPI  Physical Exam Vital Signs  I have reviewed the triage vital signs BP 129/88   Pulse 84   Temp 98 F (36.7 C)   Resp 16   Ht 5\' 11"  (1.803 m)   Wt 133 kg   SpO2 96%   BMI 40.89 kg/m   Physical Exam Vitals reviewed.  Constitutional:      General: He is not in acute distress.    Appearance: He is well-developed and overweight. He is not diaphoretic.  HENT:     Head: Normocephalic and atraumatic.     Right Ear: External ear normal.     Left Ear: External ear normal.     Nose: Nose normal.     Mouth/Throat:     Mouth: Mucous membranes are moist.  Eyes:     General: No scleral icterus.    Conjunctiva/sclera: Conjunctivae normal.  Neck:     Trachea: Phonation normal.  Cardiovascular:     Rate and Rhythm: Normal rate and regular rhythm.  Pulmonary:     Effort: Pulmonary effort is normal. No respiratory distress.     Breath sounds: No stridor.  Abdominal:     General: There is no distension.     Tenderness: There is no abdominal tenderness. There is no right CVA tenderness or left CVA tenderness.  Musculoskeletal:        General: Normal range of  motion.     Cervical back: Normal range of motion.  Neurological:     Mental Status: He  is alert and oriented to person, place, and time.  Psychiatric:        Behavior: Behavior normal.     ED Results and Treatments Labs (all labs ordered are listed, but only abnormal results are displayed) Labs Reviewed  URINALYSIS, ROUTINE W REFLEX MICROSCOPIC - Abnormal; Notable for the following components:      Result Value   Color, Urine RED (*)    APPearance CLOUDY (*)    Specific Gravity, Urine 1.004 (*)    Hgb urine dipstick MODERATE (*)    Protein, ur 100 (*)    Leukocytes,Ua LARGE (*)    Bacteria, UA MANY (*)    All other components within normal limits  BASIC METABOLIC PANEL - Abnormal; Notable for the following components:   Glucose, Bld 182 (*)    All other components within normal limits  URINE CULTURE  CBC                                                                                                                         EKG  EKG Interpretation Date/Time:    Ventricular Rate:    PR Interval:    QRS Duration:    QT Interval:    QTC Calculation:   R Axis:      Text Interpretation:         Radiology No results found.  Medications Ordered in ED Medications  cephALEXin (KEFLEX) capsule 500 mg (has no administration in time range)   Procedures Procedures  (including critical care time) Medical Decision Making / ED Course   Medical Decision Making Amount and/or Complexity of Data Reviewed Labs: ordered.  Risk Prescription drug management.    Patient presents with dysuria and hematuria.  Previously admitted for UTI that grew out pansensitive E. Coli.  CT scan at that time showed multiple renal cysts one of of which was thought to be hemorrhagic cyst which may be contributing to the patient's hematuria.  CBC without leukocytosis or anemia.  BMP without significant electrolyte derangements or renal sufficiency.  UA does appear to be infected.  Will treat with  Keflex.  Urology follow-up recommended.    Final Clinical Impression(s) / ED Diagnoses Final diagnoses:  Cystitis  Gross hematuria   The patient appears reasonably screened and/or stabilized for discharge and I doubt any other medical condition or other [facility] requiring further screening, evaluation, or treatment in the ED at this time. I have discussed the findings, Dx and Tx plan with the patient/family who expressed understanding and agree(s) with the plan. Discharge instructions discussed at length. The patient/family was given strict return precautions who verbalized understanding of the instructions. No further questions at time of discharge.  Disposition: Discharge  Condition: Good  ED Discharge Orders          Ordered    cephALEXin (KEFLEX) 500 MG capsule  3 times daily        04/22/23 0318    ondansetron (ZOFRAN-ODT) 4 MG disintegrating tablet  Every 8 hours PRN        04/22/23 1610              Follow Up: Milderd Meager., MD [address] [place] [address] [phone]  Call  to schedule an appointment for close follow up    This chart was dictated using voice recognition software.  Despite best efforts to proofread,  errors can occur which can change the documentation meaning.    Nira Conn, MD 04/22/23 [phone]

## 2023-04-24 LAB — URINE CULTURE: Culture: >=100000 — AB

## 2023-04-25 ENCOUNTER — Telehealth (HOSPITAL_BASED_OUTPATIENT_CLINIC_OR_DEPARTMENT_OTHER): Payer: Self-pay

## 2023-04-25 NOTE — Telephone Encounter (Signed)
Post ED Visit - Positive Culture Follow-up  Culture report reviewed by antimicrobial stewardship pharmacist: [facility] []  Enzo Bi, Pharm.D. []  Celedonio Miyamoto, Pharm.D., BCPS AQ-ID []  Garvin Fila, Pharm.D., BCPS []  Georgina Pillion, [address].D., BCPS []  [place], [place].D., BCPS, AAHIVP []  Estella Husk, Pharm.D., BCPS, AAHIVP []  Lysle Pearl, PharmD, BCPS []  Phillips Climes, PharmD, BCPS []  Agapito Games, PharmD, BCPS []  Verlan Friends, PharmD []  Mervyn Gay, PharmD, BCPS []  Vinnie Level, PharmD  [facility] [x]  Sharin Mons, PharmD []  Greer Pickerel, PharmD []  Adalberto Cole, PharmD []  Perlie Gold, Rph []  Lonell Face) Jean Rosenthal, PharmD []  Earl Many, PharmD []  Junita Push, PharmD []  Dorna Leitz, PharmD []  Terrilee Files, PharmD []  Lynann Beaver, PharmD []  Keturah Barre, PharmD []  Loralee Pacas, PharmD []  Bernadene Person, PharmD   Positive urine culture Treated with Cephalexin, organism sensitive to the same and no further patient follow-up is required at this time.  Sandria Senter 04/25/2023, 8:49 AM

## 2023-05-07 ENCOUNTER — Ambulatory Visit: Payer: Self-pay | Admitting: Family Medicine

## 2023-05-07 NOTE — Telephone Encounter (Signed)
  Chief Complaint: hematuria Symptoms: urinary frequency (yesterday), dull discomfort when urinating, "little blood flecks" and dark yellow urine with a hint of red or pink. Frequency: 3 episodes since yesterday Pertinent Negatives: Patient denies fever, abdominal pain, nausea, vomiting, back or flank pain, recent abdomen or back injury, recent urinary catheterization. Disposition: [] ED /[] Urgent Care (no appt availability in office) / [x] Appointment(In office/virtual)/ []  [facility]/ [] Home Care/ [] Refused Recommended Disposition /[] [place] [facility]/ []  Follow-up with PCP Additional Notes: Patient calling in concerned due to he had 2 episodes yesterday and one episode today with dark yellow urine with "little blood flecks" in it. He states it is not as severe as when he last experienced this. He has an appointment with urology tomorrow at 3:30pm. Advised patient to keep his appointment with urology and to call back for any new or worsening symptoms. He would like to know if Dr Veto Kemps thinks he should be on an antibiotic or if he needs any further care until his urology appointment tomorrow.  Copied from CRM [id number]. Topic: Clinical - Medical Advice >> May 07, 2023 10:02 AM Desma Mcgregor wrote: Reason for CRM: Pt requesting a callback to speak with Angelique Blonder or Dr. Veto Kemps. He says his appt is scheduled with the neurologist, but his symptoms are coming back and he is having some discomfort. Asking if there is something he can do or take. Please follow up. Reason for Disposition  Pain or burning with passing urine  Answer Assessment - Initial Assessment Questions 1. COLOR of URINE: "Describe the color of the urine."  (e.g., tea-colored, pink, red, bloody) "Do you have blood clots in your urine?" (e.g., none, pea, grape, small coin)     Patient states "I could see a little blood and it wasn't as bad as the first". Denies any blood clots, states it was "little blood flecks" and dark yellow with a  hint of red or pink.  2. ONSET: "When did the bleeding start?"      Yesterday around 6pm.  3. EPISODES: "How many times has there been blood in the urine?" or "How many times today?"     1 episode this morning and 2 episodes last night.  4. PAIN with URINATION: "Is there any pain with passing your urine?" If Yes, ask: "How bad is the pain?"  (Scale 1-10; or mild, moderate, severe)    - MILD: Complains slightly about urination hurting.    - MODERATE: Interferes with normal activities.      - SEVERE: Excruciating, unwilling or unable to urinate because of the pain.      Discomfort. Denies any at this time, states when urinating it was an 8/10.   5. FEVER: "Do you have a fever?" If Yes, ask: "What is your temperature, how was it measured, and when did it start?"     Denies.  6. ASSOCIATED SYMPTOMS: "Are you passing urine more frequently than usual?"     Yes more frequent urination yesterday.  7. OTHER SYMPTOMS: "Do you have any other symptoms?" (e.g., back/flank pain, abdomen pain, vomiting)     Denies.  Protocols used: Urine - Blood In-A-AH

## 2023-05-07 NOTE — Telephone Encounter (Signed)
 Spoke to patient and he has an appointment with Urology on 05/07/23 @ 3:30.  Advised he could try AZO to see if this helps with painful urination.   Dm/cma

## 2023-05-08 ENCOUNTER — Encounter: Payer: Self-pay | Admitting: Urology

## 2023-05-08 ENCOUNTER — Ambulatory Visit: Admitting: Urology

## 2023-05-08 VITALS — BP 147/87 | HR 94 | Ht 71.0 in | Wt 294.0 lb

## 2023-05-08 DIAGNOSIS — N401 Enlarged prostate with lower urinary tract symptoms: Secondary | ICD-10-CM | POA: Diagnosis not present

## 2023-05-08 DIAGNOSIS — R300 Dysuria: Secondary | ICD-10-CM

## 2023-05-08 DIAGNOSIS — Z87440 Personal history of urinary (tract) infections: Secondary | ICD-10-CM

## 2023-05-08 DIAGNOSIS — R310 Gross hematuria: Secondary | ICD-10-CM

## 2023-05-08 DIAGNOSIS — N138 Other obstructive and reflux uropathy: Secondary | ICD-10-CM

## 2023-05-08 LAB — URINALYSIS, ROUTINE W REFLEX MICROSCOPIC
Bilirubin, UA: NEGATIVE
Glucose, UA: NEGATIVE
Ketones, UA: NEGATIVE
Nitrite, UA: NEGATIVE
Protein,UA: NEGATIVE
RBC, UA: NEGATIVE
Specific Gravity, UA: <1.005 — ABNORMAL LOW (ref 1.005–1.030)
Urobilinogen, Ur: 0.2 mg/dL (ref 0.2–1.0)
pH, UA: 5.5 (ref 5.0–7.5)

## 2023-05-08 LAB — MICROSCOPIC EXAMINATION

## 2023-05-08 LAB — BLADDER SCAN AMB NON-IMAGING

## 2023-05-08 MED ORDER — SULFAMETHOXAZOLE-TRIMETHOPRIM 800-160 MG PO TABS: 1.0000 | ORAL_TABLET | Freq: Two times a day (BID) | ORAL | 0 refills | Status: AC

## 2023-05-08 MED ORDER — ALFUZOSIN HCL ER 10 MG PO TB24: 10.0000 mg | ORAL_TABLET | Freq: Every day | ORAL | 11 refills | Status: DC

## 2023-05-08 NOTE — Progress Notes (Signed)
 Assessment: 1. Gross hematuria   2. History of UTI   3. 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 Urine culture sent today Begin Bactrim DS BID x 7 days.  Rx sent. Begin alfuzosin 10 mg daily.  Rx sent. His recent episodes of gross hematuria appear to be associated with recent UTIs.  However, I would recommend further evaluation with cystoscopy. Return to office in 4 weeks for cystoscopy  Chief Complaint:  Chief Complaint  Patient presents with   Hematuria    History of Present Illness:  Edwin [name] is a 58 y.o. male who is seen in consultation from Loyola Mast, MD for evaluation of gross hematuria and UTIs. He presented to the emergency room on 03/26/2023 with gross hematuria with clots.  He was also having frequency and urgency. Urinalysis showed >50 RBCs.  Urine culture grew 30K E. coli. CT from 03/26/2023 showed a 1.7 cm high density lesion in the lower pole of the left kidney, 2 simple cyst arising from the left kidney, no renal or ureteral calculi, no evidence of obstruction, and mild bladder wall thickening. Renal ultrasound from 03/27/2023 showed 2 cystic lesions along the posterior left kidney without evidence of internal vascularity. Outpatient management with cefdinir was recommended.  He returned to the emergency room later that day due to fever and chills. He was admitted to the hospital for IV antibiotics.  He discharged home on cephalexin. He returned to the emergency room on 04/22/2023 with gross hematuria.  Urinalysis showed >50 WBCs and >50 RBCs. Urine culture grew >100 K E. coli.  He was again treated with cephalexin x 10 days. His symptoms resolved while on the antibiotic.  He has noted a return of dysuria and frequency within the past 24 hours.  He also has noted some intermittent hematuria.  No flank pain or abdominal pain.  No  fevers or chills. He does have a sensation of incomplete emptying at times and an intermittent stream.  No prior treatment for BPH. IPSS = 8/6 today.  No prior history of UTIs.  He has a remote history of passing a kidney stone 25 years ago.  Past Medical History:  Past Medical History:  Diagnosis Date   Allergy    DM (diabetes mellitus) (HCC)    type 2   High cholesterol    Hypertension    Low testosterone     Past Surgical History:  Past Surgical History:  Procedure Laterality Date   COLONOSCOPY  10 years ago   at [facility] "normal" exam   HERNIA REPAIR     NASAL SINUS SURGERY  2010   UPPER GASTROINTESTINAL ENDOSCOPY  10 years ago   WISDOM TOOTH EXTRACTION      Allergies:  Allergies  Allergen Reactions   Avelox [Moxifloxacin Hcl In Nacl] Hives and Itching   Farxiga [Dapagliflozin] Hives   Doxycycline Rash    Family History:  Family History  Problem Relation Age of Onset   Aneurysm Mother 28       Deceased   Healthy Father        Living   Prostate cancer Paternal Uncle    Healthy Daughter        x3   Colon cancer Neg Hx    Esophageal cancer Neg Hx    Rectal cancer Neg Hx    Stomach cancer Neg Hx  Social History:  Social History   Tobacco Use   Smoking status: Former    Current packs/day: 0.00    Average packs/day: 0.5 packs/day for 15.0 years (7.5 ttl pk-yrs)    Types: Cigarettes    Start date: 04/17/1990    Quit date: 04/17/2005    Years since quitting: 18.0   Smokeless tobacco: Never  Vaping Use   Vaping status: Never Used  Substance Use Topics   Alcohol use: No   Drug use: No    Review of symptoms:  Constitutional:  Negative for unexplained weight loss, night sweats, fever, chills ENT:  Negative for nose bleeds, sinus pain, painful swallowing CV:  Negative for chest pain, shortness of breath, exercise intolerance, palpitations, loss of consciousness Resp:  Negative for cough, wheezing, shortness of breath GI:  Negative for nausea,  vomiting, diarrhea, bloody stools GU:  Positives noted in HPI; otherwise negative for urinary incontinence Neuro:  Negative for seizures, poor balance, limb weakness, slurred speech Psych:  Negative for lack of energy, depression, anxiety Endocrine:  Negative for polydipsia, polyuria, symptoms of hypoglycemia (dizziness, hunger, sweating) Hematologic:  Negative for anemia, purpura, petechia, prolonged or excessive bleeding, use of anticoagulants  Allergic:  Negative for difficulty breathing or choking as a result of exposure to anything; no shellfish allergy; no allergic response (rash/itch) to materials, foods  Physical exam: BP (!) 147/87   Pulse 94   Ht 5\' 11"  (1.803 m)   Wt 294 lb (133.4 kg)   BMI 41.00 kg/m  GENERAL APPEARANCE:  Well appearing, well developed, well nourished, NAD HEENT: Atraumatic, Normocephalic, oropharynx clear. NECK: Supple without lymphadenopathy or thyromegaly. LUNGS: Clear to auscultation bilaterally. HEART: Regular Rate and Rhythm without murmurs, gallops, or rubs. ABDOMEN: Soft, non-tender, No Masses. EXTREMITIES: Moves all extremities well.  Without clubbing, cyanosis, or edema. NEUROLOGIC:  Alert and oriented x 3, normal gait, CN II-XII grossly intact.  MENTAL STATUS:  Appropriate. BACK:  Non-tender to palpation.  No CVAT SKIN:  Warm, dry and intact.   GU: Penis:  uncircumcised Meatus: Normal Scrotum: normal, no masses Testis: normal without masses bilateral Epididymis: normal Prostate: 40 g, NT, no nodules Rectum: Normal tone,  no masses or tenderness   Results:   Results for orders placed or performed in visit on 05/08/23 (from the past 24 hours)  Microscopic Examination   Collection Time: 05/08/23 12:00 AM   Urine  Result Value Ref Range   WBC, UA 6-10 (A) 0 - 5 /hpf   RBC, Urine 0-2 0 - 2 /hpf   Epithelial Cells (non renal) 0-10 0 - 10 /hpf   Renal Epithel, UA 0-10 None seen /hpf   Bacteria, UA Few None seen/Few  Urinalysis, Routine  w reflex microscopic   Collection Time: 05/08/23 12:00 AM  Result Value Ref Range   Specific Gravity, UA <1.005 (L) 1.005 - 1.030   pH, UA 5.5 5.0 - 7.5   Color, UA Yellow Yellow   Appearance Ur Clear Clear   Leukocytes,UA Trace (A) Negative   Protein,UA Negative Negative/Trace   Glucose, UA Negative Negative   Ketones, UA Negative Negative   RBC, UA Negative Negative   Bilirubin, UA Negative Negative   Urobilinogen, Ur 0.2 0.2 - 1.0 mg/dL   Nitrite, UA Negative Negative   Microscopic Examination See below:   Bladder Scan (Post Void Residual) in office   Collection Time: 05/08/23  4:18 PM  Result Value Ref Range   Scan Result 

## 2023-05-09 ENCOUNTER — Encounter: Payer: Commercial Managed Care - PPO | Admitting: Urology

## 2023-05-10 LAB — URINE CULTURE

## 2023-05-12 ENCOUNTER — Other Ambulatory Visit: Payer: Self-pay | Admitting: Family Medicine

## 2023-05-12 DIAGNOSIS — E119 Type 2 diabetes mellitus without complications: Secondary | ICD-10-CM

## 2023-05-12 DIAGNOSIS — I10 Essential (primary) hypertension: Secondary | ICD-10-CM

## 2023-05-12 DIAGNOSIS — L304 Erythema intertrigo: Secondary | ICD-10-CM

## 2023-05-14 NOTE — Telephone Encounter (Signed)
 Patient notified VIA phone that we will discuss this at his upcoming appointment.  Agrees with the plan.  Dm/cma

## 2023-05-14 NOTE — Telephone Encounter (Signed)
 Copied from CRM [id number]. Topic: General - Other >> May 14, 2023 11:12 AM Elizebeth Brooking wrote: Reason for CRM: Patient called in regarding test result update message, wanted someone to give him a callback regarding this because he has some questions about it

## 2023-05-30 LAB — HM DIABETES EYE EXAM

## 2023-06-05 ENCOUNTER — Encounter: Payer: Self-pay | Admitting: Urology

## 2023-06-05 ENCOUNTER — Ambulatory Visit: Admitting: Urology

## 2023-06-05 VITALS — BP 153/81 | HR 89 | Ht 71.0 in | Wt 293.0 lb

## 2023-06-05 DIAGNOSIS — Z87440 Personal history of urinary (tract) infections: Secondary | ICD-10-CM

## 2023-06-05 DIAGNOSIS — N138 Other obstructive and reflux uropathy: Secondary | ICD-10-CM | POA: Diagnosis not present

## 2023-06-05 DIAGNOSIS — R310 Gross hematuria: Secondary | ICD-10-CM

## 2023-06-05 DIAGNOSIS — N401 Enlarged prostate with lower urinary tract symptoms: Secondary | ICD-10-CM | POA: Diagnosis not present

## 2023-06-05 LAB — URINALYSIS, ROUTINE W REFLEX MICROSCOPIC
Bilirubin, UA: NEGATIVE
Glucose, UA: NEGATIVE
Ketones, UA: NEGATIVE
Leukocytes,UA: NEGATIVE
Nitrite, UA: NEGATIVE
Protein,UA: NEGATIVE
RBC, UA: NEGATIVE
Specific Gravity, UA: 1.015 (ref 1.005–1.030)
Urobilinogen, Ur: 0.2 mg/dL (ref 0.2–1.0)
pH, UA: 5.5 (ref 5.0–7.5)

## 2023-06-05 MED ORDER — SULFAMETHOXAZOLE-TRIMETHOPRIM 800-160 MG PO TABS
1.0000 | ORAL_TABLET | Freq: Once | ORAL | Status: AC
  Administered 2023-06-05: 1 via ORAL

## 2023-06-05 NOTE — Progress Notes (Addendum)
 Assessment: 1. Gross hematuria   2. History of UTI   3. BPH with obstruction/lower urinary tract symptoms     Plan: Continue alfuzosin 10 mg daily.   No concerning abnormalities found on cystoscopy today. Urine cytology sent. Bactrim x 1 following cystoscopy. His recent episodes of gross hematuria appear to be associated with recent UTIs.   Return to office in 2 months  Chief Complaint:  Chief Complaint  Patient presents with   Hematuria    History of Present Illness:  Edwin [name] is a 58 y.o. male who is seen  for further evaluation of gross hematuria and UTIs. He presented to the emergency room on 03/26/2023 with gross hematuria with clots.  He was also having frequency and urgency. Urinalysis showed >50 RBCs.  Urine culture grew 30K E. coli. CT from 03/26/2023 showed a 1.7 cm high density lesion in the lower pole of the left kidney, 2 simple cyst arising from the left kidney, no renal or ureteral calculi, no evidence of obstruction, and mild bladder wall thickening. Renal ultrasound from 03/27/2023 showed 2 cystic lesions along the posterior left kidney without evidence of internal vascularity. Outpatient management with cefdinir was recommended.  He returned to the emergency room later that day due to fever and chills. He was admitted to the hospital for IV antibiotics.  He discharged home on cephalexin. He returned to the emergency room on 04/22/2023 with gross hematuria.  Urinalysis showed >50 WBCs and >50 RBCs. Urine culture grew >100 K E. coli.  He was again treated with cephalexin x 10 days. His symptoms resolved while on the antibiotic.   At his visit in March 2025, he  noted a return of dysuria and frequency x 24 hours.  He also noted some intermittent hematuria.  No flank pain or abdominal pain.  No fevers or chills. He does have a sensation of incomplete emptying at times and an intermittent stream.  No prior treatment for BPH. IPSS = 8/6. Urine culture grew <10K  colonies.  He was treated with Bactrim x 7 days and started on alfuzosin.  No prior history of UTIs.  He has a remote history of passing a kidney stone 25 years ago.  He presents today for further evaluation with cystoscopy. No further episodes of gross hematuria.  He feels like his urinary symptoms have improved with the alfuzosin. IPSS = 5 today.  Portions of the above documentation were copied from a prior visit for review purposes only.   Past Medical History:  Past Medical History:  Diagnosis Date   Allergy    DM (diabetes mellitus) (HCC)    type 2   High cholesterol    Hypertension    Low testosterone     Past Surgical History:  Past Surgical History:  Procedure Laterality Date   COLONOSCOPY  10 years ago   at [facility] "normal" exam   HERNIA REPAIR     NASAL SINUS SURGERY  2010   UPPER GASTROINTESTINAL ENDOSCOPY  10 years ago   WISDOM TOOTH EXTRACTION      Allergies:  Allergies  Allergen Reactions   Avelox [Moxifloxacin Hcl In Nacl] Hives and Itching   Farxiga [Dapagliflozin] Hives   Doxycycline Rash    Family History:  Family History  Problem Relation Age of Onset   Aneurysm Mother 6       Deceased   Healthy Father        Living   Prostate cancer Paternal Uncle    Healthy Daughter  x3   Colon cancer Neg Hx    Esophageal cancer Neg Hx    Rectal cancer Neg Hx    Stomach cancer Neg Hx     Social History:  Social History   Tobacco Use   Smoking status: Former    Current packs/day: 0.00    Average packs/day: 0.5 packs/day for 15.0 years (7.5 ttl pk-yrs)    Types: Cigarettes    Start date: 04/17/1990    Quit date: 04/17/2005    Years since quitting: 18.1   Smokeless tobacco: Never  Vaping Use   Vaping status: Never Used  Substance Use Topics   Alcohol use: No   Drug use: No    ROS: Constitutional:  Negative for fever, chills, weight loss CV: Negative for chest pain, previous MI, hypertension Respiratory:  Negative for shortness  of breath, wheezing, sleep apnea, frequent cough GI:  Negative for nausea, vomiting, bloody stool, GERD  Physical exam: BP (!) 153/81   Pulse 89   Ht 5\' 11"  (1.803 m)   Wt 293 lb (132.9 kg)   BMI 40.87 kg/m  GENERAL APPEARANCE:  Well appearing, well developed, well nourished, NAD HEENT:  Atraumatic, normocephalic, oropharynx clear NECK:  Supple without lymphadenopathy or thyromegaly ABDOMEN:  Soft, non-tender, no masses EXTREMITIES:  Moves all extremities well, without clubbing, cyanosis, or edema NEUROLOGIC:  Alert and oriented x 3, normal gait, CN II-XII grossly intact MENTAL STATUS:  appropriate BACK:  Non-tender to palpation, No CVAT SKIN:  Warm, dry, and intact   Results: U/A: negative  Procedure:  Flexible Cystourethroscopy  Pre-operative Diagnosis: Gross hematuria  Post-operative Diagnosis: Gross hematuria  Anesthesia:  local with lidocaine jelly  Surgical Narrative:  After appropriate informed consent was obtained, the patient was prepped and draped in the usual sterile fashion in the supine position.  The patient was correctly identified and the proper procedure delineated prior to proceeding.  Sterile lidocaine gel was instilled in the urethra. The flexible cystoscope was introduced without difficulty.  Findings:  Anterior urethra: Normal  Posterior urethra: Lateral lobe hypertrophy  Bladder: Normal  Ureteral orifices: normal  Additional findings: none  Saline bladder wash for cytology was performed.    The cystoscope was then removed.  The patient tolerated the procedure well.

## 2023-06-05 NOTE — Addendum Note (Signed)
 Addended by: Lizbeth Bark on: 06/05/2023 11:57 AM   Modules accepted: Orders

## 2023-06-12 ENCOUNTER — Encounter: Payer: Self-pay | Admitting: Urology

## 2023-06-21 ENCOUNTER — Other Ambulatory Visit: Payer: Self-pay | Admitting: Family Medicine

## 2023-06-21 DIAGNOSIS — L304 Erythema intertrigo: Secondary | ICD-10-CM

## 2023-06-24 ENCOUNTER — Encounter: Payer: Self-pay | Admitting: Family Medicine

## 2023-06-24 ENCOUNTER — Ambulatory Visit: Payer: Commercial Managed Care - PPO | Admitting: Family Medicine

## 2023-06-24 VITALS — BP 130/70 | HR 105 | Temp 98.2°F | Ht 71.0 in | Wt 300.8 lb

## 2023-06-24 DIAGNOSIS — N401 Enlarged prostate with lower urinary tract symptoms: Secondary | ICD-10-CM

## 2023-06-24 DIAGNOSIS — Z7984 Long term (current) use of oral hypoglycemic drugs: Secondary | ICD-10-CM

## 2023-06-24 DIAGNOSIS — I10 Essential (primary) hypertension: Secondary | ICD-10-CM | POA: Diagnosis not present

## 2023-06-24 DIAGNOSIS — Z7985 Long-term (current) use of injectable non-insulin antidiabetic drugs: Secondary | ICD-10-CM

## 2023-06-24 DIAGNOSIS — N138 Other obstructive and reflux uropathy: Secondary | ICD-10-CM

## 2023-06-24 DIAGNOSIS — L304 Erythema intertrigo: Secondary | ICD-10-CM

## 2023-06-24 DIAGNOSIS — E782 Mixed hyperlipidemia: Secondary | ICD-10-CM | POA: Diagnosis not present

## 2023-06-24 DIAGNOSIS — E119 Type 2 diabetes mellitus without complications: Secondary | ICD-10-CM

## 2023-06-24 MED ORDER — CLOTRIMAZOLE-BETAMETHASONE 1-0.05 % EX CREA: TOPICAL_CREAM | Freq: Two times a day (BID) | CUTANEOUS | 1 refills | Status: AC

## 2023-06-24 MED ORDER — OZEMPIC (1 MG/DOSE) 4 MG/3ML ~~LOC~~ SOPN: PEN_INJECTOR | SUBCUTANEOUS | 11 refills | Status: AC

## 2023-06-24 MED ORDER — CLOTRIMAZOLE-BETAMETHASONE 1-0.05 % EX CREA
TOPICAL_CREAM | Freq: Two times a day (BID) | CUTANEOUS | 1 refills | Status: DC
Start: 2023-06-24 — End: 2023-06-24

## 2023-06-24 MED ORDER — CLOTRIMAZOLE-BETAMETHASONE 1-0.05 % EX CREA: 1.0000 | TOPICAL_CREAM | Freq: Every day | CUTANEOUS | 2 refills | Status: AC

## 2023-06-24 NOTE — Assessment & Plan Note (Signed)
 Recently seen by urology. Started on alfuzosin  10 mg daily.

## 2023-06-24 NOTE — Assessment & Plan Note (Signed)
 Blood pressure is in good control. Continue amlodipine 5 mg daily.

## 2023-06-24 NOTE — Addendum Note (Signed)
 Addended by: Graig Lawyer on: 06/24/2023 06:00 PM   Modules accepted: Orders

## 2023-06-24 NOTE — Addendum Note (Signed)
 Addended by: Graig Lawyer on: 06/24/2023 05:40 PM   Modules accepted: Orders

## 2023-06-24 NOTE — Progress Notes (Signed)
 [facility]ARE [facility] [address] [place] [address] Dept: [phone] Dept Fax: [phone]  Chronic Care Office Visit  Subjective:    Patient ID: Edwin [name], male    DOB: November 16, 1965, 58 y.o..   MRN: [id number]  Chief Complaint  Patient presents with   Follow-up    3 month f/u.     History of Present Illness:  Patient is in today for reassessment of chronic medical issues.  Mr. Pilley has a history of Type 2 diabetes. He is managed on metformin  1000 mg bid, pioglitazone  45 mg daily, and semaglutide  (Ozempic ) 1 mg weekly.    Mr. Luecke has a history of hypertension. He is managed on amlodipine  5 mg daily.    Mr. Deeb has a history of hyperlipidemia. He is managed on atorvastatin  40 mg daily.  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   Fever 03/26/2023   Sepsis secondary to UTI (HCC) 03/26/2023   Intertrigo 03/22/2023   At risk for obstructive sleep apnea 01/03/2022   Plantar fasciitis 01/03/2022   Pain due to onychomycosis of toenails of both feet 01/23/2021   Lymphadenopathy of head and neck 08/31/2020   Class 3 severe obesity due to excess calories with serious comorbidity and body mass index (BMI) of 40.0 to 44.9 in adult (HCC) 08/30/2020   Allergic rhinitis 05/30/2020   Erectile dysfunction 05/30/2020   Eustachian tube dysfunction, left 10/20/2019   Seborrheic keratosis 04/15/2017   PVC's (premature ventricular contractions) 09/03/2014   Essential hypertension 11/03/2013   Type 2 diabetes mellitus without complications (HCC) 08/03/2013   Hyperlipidemia 08/03/2013   Low serum testosterone  level 08/03/2013   Past Surgical History:  Procedure Laterality Date   COLONOSCOPY  10 years ago   at [facility]  "normal" exam   HERNIA REPAIR     NASAL SINUS SURGERY  2010   UPPER GASTROINTESTINAL ENDOSCOPY  10 years ago   WISDOM TOOTH EXTRACTION     Family History  Problem Relation Age of Onset   Aneurysm Mother 62       Deceased   Healthy Father        Living   Prostate cancer Paternal Uncle    Healthy Daughter        x3   Colon cancer Neg Hx    Esophageal cancer Neg Hx    Rectal cancer Neg Hx    Stomach cancer Neg Hx    Outpatient Medications Prior to Visit  Medication Sig Dispense Refill   alfuzosin  (UROXATRAL ) 10 MG 24 hr tablet Take 1 tablet (10 mg total) by mouth daily. 30 tablet 11   amLODipine  (NORVASC ) 5 MG tablet Take 1 tablet by mouth once daily 90 tablet 3   aspirin  EC 81 MG tablet Take 1 tablet by mouth daily.     atorvastatin  (LIPITOR) 40 MG tablet Take 1 tablet (40 mg total) by mouth daily. 1 daily 90 tablet 3   Blood Glucose Monitoring Suppl (ONETOUCH VERIO FLEX SYSTEM) w/Device KIT 1 Units by Does not apply route daily. Dx:E11.9 1 kit 0   fluticasone  (FLONASE ) 50 MCG/ACT nasal spray Place 2 sprays into both nostrils daily as needed for allergies or rhinitis. 16 g 6   glucose blood test strip Use as instructed daily Dx: E11.9 100 each 3  metFORMIN  (GLUCOPHAGE ) 1000 MG tablet Take 1 tablet by mouth twice daily with food 180 tablet 3   multivitamin (ONE-A-DAY MEN'S) TABS tablet Take 1 tablet by mouth daily.     OneTouch Delica Lancets 33G MISC 1 Stick by Does not apply route daily. Dx:E11.9 100 each 3   pioglitazone  (ACTOS ) 45 MG tablet Take 1 tablet (45 mg total) by mouth daily. 90 tablet 3   polyethylene glycol (MIRALAX  / GLYCOLAX ) 17 g packet Take 17 g by mouth daily as needed for mild constipation. 14 each 0   predniSONE  (DELTASONE ) 20 MG tablet Take 2 tablets (40 mg total) by mouth daily with breakfast. 10 tablet 0   vitamin C  (VITAMIN C ) 500 MG tablet Take 1 tablet (500 mg total) by mouth daily. Please take for 2 weeks     clotrimazole -betamethasone  (LOTRISONE ) cream  APPLY  CREAM TOPICALLY TWICE DAILY 30 g 0   Semaglutide , 1 MG/DOSE, (OZEMPIC , 1 MG/DOSE,) 4 MG/3ML SOPN INJECT 1 MG SUBCUTANEOUSLY AS DIRECTED WEEKLY (Patient taking differently: Inject 1 mg into the skin once a week. INJECT 1 MG SUBCUTANEOUSLY AS DIRECTED WEEKLY) 3 mL 11   No facility-administered medications prior to visit.   Allergies  Allergen Reactions   Avelox [Moxifloxacin Hcl In Nacl] Hives and Itching   Farxiga  [Dapagliflozin ] Hives   Doxycycline  Rash   Objective:   Today's Vitals   06/24/23 1447  BP: 130/70  Pulse: (!) 105  Temp: 98.2 F (36.8 C)  TempSrc: Temporal  SpO2: 97%  Weight: (!) 300 lb 12.8 oz (136.4 kg)  Height: 5\' 11"  (1.803 m)   Body mass index is 41.95 kg/m.   General: Well developed, well nourished. No acute distress. Psych: Alert and oriented. Normal mood and affect.  Health Maintenance Due  Topic Date Due   Pneumococcal Vaccine 59-57 Years old (1 of 2 - PCV) Never done   Zoster Vaccines- Shingrix (1 of 2) Never done   OPHTHALMOLOGY EXAM  08/22/2022     Assessment & Plan:   Problem List Items Addressed This Visit       Cardiovascular and Mediastinum   Essential hypertension   Blood pressure is in good control. Continue amlodipine  5 mg daily.        Endocrine   Type 2 diabetes mellitus without complications (HCC) - Primary   RI will reassess the A1c today. Continue metformin  1000 mg bid, pioglitazone  45 mg daily, and semaglutide  (Ozempic ) 1 mg weekly. If the A1c continues to increase, I would consider increasing his semaglutide  to 2 mg weekly.      Relevant Medications   Semaglutide , 1 MG/DOSE, (OZEMPIC , 1 MG/DOSE,) 4 MG/3ML SOPN   Other Relevant Orders   Glucose, random   Hemoglobin A1c     Musculoskeletal and Integument   Intertrigo   Intermittent. Continue Lotrisone  as needed.      Relevant Medications   clotrimazole -betamethasone  (LOTRISONE ) cream     Genitourinary   BPH with obstruction/lower urinary tract symptoms    Recently seen by urology. Started on alfuzosin  10 mg daily.        Other   Hyperlipidemia   At goal. Continue atorvastatin  40 mg daily.       Return in about 3 months (around 09/23/2023) for Reassessment.   Graig Lawyer, MD

## 2023-06-24 NOTE — Assessment & Plan Note (Signed)
At goal. Continue atorvastatin 40 mg daily.

## 2023-06-24 NOTE — Assessment & Plan Note (Signed)
 RI will reassess the A1c today. Continue metformin  1000 mg bid, pioglitazone  45 mg daily, and semaglutide  (Ozempic ) 1 mg weekly. If the A1c continues to increase, I would consider increasing his semaglutide  to 2 mg weekly.

## 2023-06-24 NOTE — Assessment & Plan Note (Signed)
 Intermittent. Continue Lotrisone as needed.

## 2023-06-25 ENCOUNTER — Encounter: Payer: Self-pay | Admitting: Family Medicine

## 2023-06-25 LAB — GLUCOSE, RANDOM: Glucose, Bld: 114 mg/dL — ABNORMAL HIGH (ref 70–99)

## 2023-06-25 LAB — HEMOGLOBIN A1C: Hgb A1c MFr Bld: 7.2 % — ABNORMAL HIGH (ref 4.6–6.5)

## 2023-08-05 ENCOUNTER — Encounter: Payer: Self-pay | Admitting: Urology

## 2023-08-05 ENCOUNTER — Ambulatory Visit: Admitting: Urology

## 2023-08-05 VITALS — BP 129/76 | HR 85 | Ht 71.0 in | Wt 297.0 lb

## 2023-08-05 DIAGNOSIS — Z87440 Personal history of urinary (tract) infections: Secondary | ICD-10-CM | POA: Diagnosis not present

## 2023-08-05 DIAGNOSIS — N401 Enlarged prostate with lower urinary tract symptoms: Secondary | ICD-10-CM | POA: Diagnosis not present

## 2023-08-05 DIAGNOSIS — R310 Gross hematuria: Secondary | ICD-10-CM

## 2023-08-05 DIAGNOSIS — N138 Other obstructive and reflux uropathy: Secondary | ICD-10-CM

## 2023-08-05 NOTE — Progress Notes (Signed)
 Assessment: 1. Gross hematuria - negative evaluation 4/25   2. History of UTI   3. BPH with obstruction/lower urinary tract symptoms    Plan: Continue alfuzosin  10 mg daily.   PSA today Return to office in 6 months  Chief Complaint:  Chief Complaint  Patient presents with   Hematuria    History of Present Illness:  Edwin [name] is a 58 y.o. male who is seen  for further evaluation of gross hematuria and UTIs. He presented to the emergency room on 03/26/2023 with gross hematuria with clots.  He was also having frequency and urgency. Urinalysis showed >50 RBCs.  Urine culture grew 30K E. coli. CT from 03/26/2023 showed a 1.7 cm high density lesion in the lower pole of the left kidney, 2 simple cyst arising from the left kidney, no renal or ureteral calculi, no evidence of obstruction, and mild bladder wall thickening. Renal ultrasound from 03/27/2023 showed 2 cystic lesions along the posterior left kidney without evidence of internal vascularity. Outpatient management with cefdinir  was recommended.  He returned to the emergency room later that day due to fever and chills. He was admitted to the hospital for IV antibiotics.  He discharged home on cephalexin . He returned to the emergency room on 04/22/2023 with gross hematuria.  Urinalysis showed >50 WBCs and >50 RBCs. Urine culture grew >100 K E. coli.  He was again treated with cephalexin  x 10 days. His symptoms resolved while on the antibiotic.   At his visit in March 2025, he  noted a return of dysuria and frequency x 24 hours.  He also noted some intermittent hematuria.  No flank pain or abdominal pain.  No fevers or chills. He does have a sensation of incomplete emptying at times and an intermittent stream.  No prior treatment for BPH. IPSS = 8/6. Urine culture grew <10K colonies.  He was treated with Bactrim  x 7 days and started on alfuzosin .  No prior history of UTIs.  He has a remote history of passing a kidney stone 25 years  ago.  He presented for further evaluation with cystoscopy on 06/05/2023.  He reported no further episodes of gross hematuria.  He noted improvement in his urinary symptoms with alfuzosin .   IPSS = 5. Cystoscopy from 06/05/2023 showed lateral lobe enlargement of the prostate, no bladder abnormalities.  Urine cytology was negative for malignancy.  He returns today for follow-up.  He continues on alfuzosin .  His lower urinary tract symptoms are well-controlled.  No dysuria or gross hematuria.  No recent UTI symptoms. IPSS = 4/2.  PSA 1/23: 0.32  Portions of the above documentation were copied from a prior visit for review purposes only.   Past Medical History:  Past Medical History:  Diagnosis Date   Allergy    DM (diabetes mellitus) (HCC)    type 2   High cholesterol    Hypertension    Low testosterone      Past Surgical History:  Past Surgical History:  Procedure Laterality Date   COLONOSCOPY  10 years ago   at [facility] "normal" exam   HERNIA REPAIR     NASAL SINUS SURGERY  2010   UPPER GASTROINTESTINAL ENDOSCOPY  10 years ago   WISDOM TOOTH EXTRACTION      Allergies:  Allergies  Allergen Reactions   Avelox [Moxifloxacin Hcl In Nacl] Hives and Itching   Farxiga  [Dapagliflozin ] Hives   Doxycycline  Rash    Family History:  Family History  Problem Relation Age of Onset  Aneurysm Mother 71       Deceased   Healthy Father        Living   Prostate cancer Paternal Uncle    Healthy Daughter        x3   Colon cancer Neg Hx    Esophageal cancer Neg Hx    Rectal cancer Neg Hx    Stomach cancer Neg Hx     Social History:  Social History   Tobacco Use   Smoking status: Former    Current packs/day: 0.00    Average packs/day: 0.5 packs/day for 15.0 years (7.5 ttl pk-yrs)    Types: Cigarettes    Start date: 04/17/1990    Quit date: 04/17/2005    Years since quitting: 18.3   Smokeless tobacco: Never  Vaping Use   Vaping status: Never Used  Substance Use Topics    Alcohol use: No   Drug use: No    ROS: Constitutional:  Negative for fever, chills, weight loss CV: Negative for chest pain, previous MI, hypertension Respiratory:  Negative for shortness of breath, wheezing, sleep apnea, frequent cough GI:  Negative for nausea, vomiting, bloody stool, GERD   Physical exam: BP 129/76   Pulse 85   Ht 5\' 11"  (1.803 m)   Wt 297 lb (134.7 kg)   BMI 41.42 kg/m  GENERAL APPEARANCE:  Well appearing, well developed, well nourished, NAD HEENT:  Atraumatic, normocephalic, oropharynx clear NECK:  Supple without lymphadenopathy or thyromegaly ABDOMEN:  Soft, non-tender, no masses EXTREMITIES:  Moves all extremities well, without clubbing, cyanosis, or edema NEUROLOGIC:  Alert and oriented x 3, normal gait, CN II-XII grossly intact MENTAL STATUS:  appropriate BACK:  Non-tender to palpation, No CVAT SKIN:  Warm, dry, and intact   Results: U/A: negative

## 2023-08-06 ENCOUNTER — Ambulatory Visit: Payer: Self-pay | Admitting: Urology

## 2023-08-06 LAB — PSA: Prostate Specific Ag, Serum: 0.8 ng/mL (ref 0.0–4.0)

## 2023-08-15 LAB — URINALYSIS, ROUTINE W REFLEX MICROSCOPIC
Bilirubin, UA: NEGATIVE
Glucose, UA: NEGATIVE
Ketones, UA: NEGATIVE
Leukocytes,UA: NEGATIVE
Nitrite, UA: NEGATIVE
Protein,UA: NEGATIVE
RBC, UA: NEGATIVE
Specific Gravity, UA: 1.015 (ref 1.005–1.030)
Urobilinogen, Ur: 0.2 mg/dL (ref 0.2–1.0)
pH, UA: 5.5 (ref 5.0–7.5)

## 2023-09-03 ENCOUNTER — Telehealth: Payer: Self-pay

## 2023-09-03 NOTE — Telephone Encounter (Signed)
 wCopied from CRM [id number]. Topic: Clinical - Medical Advice >> Sep 03, 2023  4:10 PM Thersia C wrote: Reason for CRM: would like for Karna to give him a call , stated he believes  he has a UTI, would like to know what  he needs to do

## 2023-09-03 NOTE — Telephone Encounter (Signed)
 Will call int he morning to get him scheduled for an appointment.  Dm/cma

## 2023-09-04 ENCOUNTER — Encounter: Payer: Self-pay | Admitting: Family Medicine

## 2023-09-04 ENCOUNTER — Ambulatory Visit: Admitting: Family Medicine

## 2023-09-04 VITALS — BP 120/76 | HR 72 | Temp 97.9°F | Ht 71.0 in | Wt 304.4 lb

## 2023-09-04 DIAGNOSIS — R300 Dysuria: Secondary | ICD-10-CM | POA: Diagnosis not present

## 2023-09-04 LAB — POCT URINALYSIS DIPSTICK
Bilirubin, UA: NEGATIVE
Blood, UA: NEGATIVE
Glucose, UA: NEGATIVE
Ketones, UA: NEGATIVE
Leukocytes, UA: NEGATIVE
Nitrite, UA: NEGATIVE
Protein, UA: NEGATIVE
Spec Grav, UA: 1.015 (ref 1.010–1.025)
Urobilinogen, UA: 0.2 U/dL
pH, UA: 6.5 (ref 5.0–8.0)

## 2023-09-04 NOTE — Assessment & Plan Note (Signed)
 UA Dipstick was normal. I will send this for culture, in light of prior severe UTI. If he had a UTI, it is possible his recent amoxicillin  course treated this.  I recommend Edwin [name] push fluids to dilute urine as much as possible.

## 2023-09-04 NOTE — Telephone Encounter (Signed)
 Called and scheduled patient today @2 :00 pm. Dm/cma

## 2023-09-04 NOTE — Progress Notes (Signed)
 [facility] [address] [place] [address] Dept: [phone] Dept Fax: [phone]  Office Visit  Subjective:    Patient ID: Edwin [name], male    DOB: 10/05/1965, 58 y.o..   MRN: [id number]  Chief Complaint  Patient presents with   Dysuria    C/o having some dysuria/frequency x 2-3 days.    History of Present Illness:  Patient is in today with a 2-3 day history of dysuria and some frequency. Edwin [name] has had prior UTIs. He also has BPH and Type 2 diabetes, which increase his risk. He was recently treated with a course of antibiotics for a sinus infection. He was on the antibiotics when the urinary symptoms developed.  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   Fever 03/26/2023   Sepsis secondary to UTI (HCC) 03/26/2023   Intertrigo 03/22/2023   At risk for obstructive sleep apnea 01/03/2022   Plantar fasciitis 01/03/2022   Pain due to onychomycosis of toenails of both feet 01/23/2021   Lymphadenopathy of head and neck 08/31/2020   Class 3 severe obesity due to excess calories with serious comorbidity and body mass index (BMI) of 40.0 to 44.9 in adult 08/30/2020   Allergic rhinitis 05/30/2020   Erectile dysfunction 05/30/2020   Eustachian tube dysfunction, left 10/20/2019   Seborrheic keratosis 04/15/2017   PVC's (premature ventricular contractions) 09/03/2014   Essential hypertension 11/03/2013   Type 2 diabetes mellitus without complications (HCC) 08/03/2013   Hyperlipidemia 08/03/2013   Low serum testosterone  level 08/03/2013   Past Surgical History:  Procedure Laterality Date   COLONOSCOPY  10 years ago   at [facility] [place] normal exam   HERNIA REPAIR     NASAL SINUS SURGERY  2010   UPPER  GASTROINTESTINAL ENDOSCOPY  10 years ago   WISDOM TOOTH EXTRACTION     Family History  Problem Relation Age of Onset   Aneurysm Mother 34       Deceased   Healthy Father        Living   Prostate cancer Paternal Uncle    Healthy Daughter        x3   Colon cancer Neg Hx    Esophageal cancer Neg Hx    Rectal cancer Neg Hx    Stomach cancer Neg Hx    Outpatient Medications Prior to Visit  Medication Sig Dispense Refill   alfuzosin  (UROXATRAL ) 10 MG 24 hr tablet Take 1 tablet (10 mg total) by mouth daily. 30 tablet 11   amLODipine  (NORVASC ) 5 MG tablet Take 1 tablet by mouth once daily 90 tablet 3   aspirin  EC 81 MG tablet Take 1 tablet by mouth daily.     atorvastatin  (LIPITOR) 40 MG tablet Take 1 tablet (40 mg total) by mouth daily. 1 daily 90 tablet 3   Blood Glucose Monitoring Suppl (ONETOUCH VERIO FLEX SYSTEM) w/Device KIT 1 Units by Does not apply route daily. Dx:E11.9 1 kit 0   clotrimazole -betamethasone  (LOTRISONE ) cream Apply 1 Application topically daily. 30 g 2   clotrimazole -betamethasone  (LOTRISONE ) cream Apply topically 2 (two) times daily. 30 g 1   fluticasone  (FLONASE ) 50 MCG/ACT nasal spray Place 2 sprays into both nostrils daily as needed for allergies or rhinitis. 16 g 6   glucose blood test strip Use as instructed  daily Dx: E11.9 100 each 3   metFORMIN  (GLUCOPHAGE ) 1000 MG tablet Take 1 tablet by mouth twice daily with food 180 tablet 3   multivitamin (ONE-A-DAY MEN'S) TABS tablet Take 1 tablet by mouth daily.     OneTouch Delica Lancets 33G MISC 1 Stick by Does not apply route daily. Dx:E11.9 100 each 3   pioglitazone  (ACTOS ) 45 MG tablet Take 1 tablet (45 mg total) by mouth daily. 90 tablet 3   polyethylene glycol (MIRALAX  / GLYCOLAX ) 17 g packet Take 17 g by mouth daily as needed for mild constipation. 14 each 0   Semaglutide , 1 MG/DOSE, (OZEMPIC , 1 MG/DOSE,) 4 MG/3ML SOPN INJECT 1 MG SUBCUTANEOUSLY AS DIRECTED WEEKLY 3 mL 11   vitamin C  (VITAMIN C ) 500 MG tablet  Take 1 tablet (500 mg total) by mouth daily. Please take for 2 weeks     No facility-administered medications prior to visit.   Allergies  Allergen Reactions   Avelox [Moxifloxacin Hcl In Nacl] Hives and Itching   Farxiga  [Dapagliflozin ] Hives   Doxycycline  Rash     Objective:   Today's Vitals   09/04/23 1359  BP: 120/76  Pulse: 72  Temp: 97.9 F (36.6 C)  TempSrc: Temporal  SpO2: 100%  Weight: (!) 304 lb 6.4 oz (138.1 kg)  Height: 5' 11 (1.803 m)   Body mass index is 42.46 kg/m.   General: Well developed, well nourished. No acute distress. GU: Normal uncircumcised male. No sign of discharge within foreskin or redness/swelling of glans. No meatal   discharge. Psych: Alert and oriented. Normal mood and affect.  Health Maintenance Due  Topic Date Due   Pneumococcal Vaccine 56-68 Years old (1 of 2 - PCV) Never done   Hepatitis B Vaccines (1 of 3 - 19+ 3-dose series) Never done   Zoster Vaccines- Shingrix (1 of 2) Never done   Diabetic kidney evaluation - Urine ACR  07/25/2014   OPHTHALMOLOGY EXAM  08/22/2022     Lab Results: Urine dipstick shows negative for all components   Assessment & Plan:   Problem List Items Addressed This Visit       Other   Dysuria - Primary   UA Dipstick was normal. I will send this for culture, in light of prior severe UTI. If he had a UTI, it is possible his recent amoxicillin  course treated this.  I recommend Edwin [name] push fluids to dilute urine as much as possible.      Relevant Orders   POCT Urinalysis Dipstick (Completed)   Urine Culture    Return for Follow-up as scheduled.   Garnette CHRISTELLA Simpler, MD

## 2023-09-05 LAB — URINE CULTURE
MICRO NUMBER:: 16652174
Result:: NO GROWTH
SPECIMEN QUALITY:: ADEQUATE

## 2023-09-09 ENCOUNTER — Ambulatory Visit: Payer: Self-pay | Admitting: Family Medicine

## 2023-09-23 ENCOUNTER — Encounter: Payer: Self-pay | Admitting: Family Medicine

## 2023-09-23 ENCOUNTER — Ambulatory Visit: Payer: Self-pay | Admitting: Family Medicine

## 2023-09-23 ENCOUNTER — Ambulatory Visit: Admitting: Family Medicine

## 2023-09-23 VITALS — BP 126/80 | HR 81 | Temp 97.0°F | Ht 71.0 in | Wt 299.0 lb

## 2023-09-23 DIAGNOSIS — Z7985 Long-term (current) use of injectable non-insulin antidiabetic drugs: Secondary | ICD-10-CM

## 2023-09-23 DIAGNOSIS — I10 Essential (primary) hypertension: Secondary | ICD-10-CM | POA: Diagnosis not present

## 2023-09-23 DIAGNOSIS — Z7984 Long term (current) use of oral hypoglycemic drugs: Secondary | ICD-10-CM

## 2023-09-23 DIAGNOSIS — E119 Type 2 diabetes mellitus without complications: Secondary | ICD-10-CM

## 2023-09-23 DIAGNOSIS — E782 Mixed hyperlipidemia: Secondary | ICD-10-CM | POA: Diagnosis not present

## 2023-09-23 LAB — LIPID PANEL
Cholesterol: 139 mg/dL (ref 0–200)
HDL: 42.2 mg/dL (ref >39.00)
LDL Cholesterol: 85 mg/dL (ref 0–99)
NonHDL: 96.58
Total CHOL/HDL Ratio: 3
Triglycerides: 59 mg/dL (ref 0.0–149.0)
VLDL: 11.8 mg/dL (ref 0.0–40.0)

## 2023-09-23 LAB — URINALYSIS, ROUTINE W REFLEX MICROSCOPIC
Bilirubin Urine: NEGATIVE
Hgb urine dipstick: NEGATIVE
Ketones, ur: NEGATIVE
Leukocytes,Ua: NEGATIVE
Nitrite: NEGATIVE
RBC / HPF: NONE SEEN (ref 0–?)
Specific Gravity, Urine: 1.01 (ref 1.000–1.030)
Total Protein, Urine: NEGATIVE
Urine Glucose: NEGATIVE
Urobilinogen, UA: 0.2 (ref 0.0–1.0)
WBC, UA: NONE SEEN (ref 0–?)
pH: 6 (ref 5.0–8.0)

## 2023-09-23 LAB — MICROALBUMIN / CREATININE URINE RATIO
Creatinine,U: 81.8 mg/dL
Microalb Creat Ratio: UNDETERMINED mg/g (ref 0.0–30.0)
Microalb, Ur: <0.7 mg/dL

## 2023-09-23 LAB — BASIC METABOLIC PANEL WITH GFR
BUN: 13 mg/dL (ref 6–23)
CO2: 27 meq/L (ref 19–32)
Calcium: 9.3 mg/dL (ref 8.4–10.5)
Chloride: 105 meq/L (ref 96–112)
Creatinine, Ser: 0.95 mg/dL (ref 0.40–1.50)
GFR: 88.58 mL/min (ref >60.00)
Glucose, Bld: 148 mg/dL — ABNORMAL HIGH (ref 70–99)
Potassium: 4 meq/L (ref 3.5–5.1)
Sodium: 140 meq/L (ref 135–145)

## 2023-09-23 LAB — HEMOGLOBIN A1C: Hgb A1c MFr Bld: 7.1 % — ABNORMAL HIGH (ref 4.6–6.5)

## 2023-09-23 NOTE — Assessment & Plan Note (Signed)
 I will check annual DM labs today. Continue metformin  1000 mg bid, pioglitazone  45 mg daily, and semaglutide  (Ozempic ) 1 mg weekly.

## 2023-09-23 NOTE — Assessment & Plan Note (Signed)
At goal. Continue atorvastatin 40 mg daily.

## 2023-09-23 NOTE — Assessment & Plan Note (Signed)
 Blood pressure is in good control. Continue amlodipine 5 mg daily.

## 2023-09-23 NOTE — Progress Notes (Signed)
 [facility]ARE [facility] [address] [place] [address] Dept: [phone] Dept Fax: [phone]  Chronic Care Office Visit  Subjective:    Patient ID: Edwin [name], male    DOB: 07/22/1965, 58 y.o..   MRN: [id number]  Chief Complaint  Patient presents with   Diabetes    Follow up, no concerns   History of Present Illness:  Patient is in today for reassessment of chronic medical issues.  Edwin [name] has a history of Type 2 diabetes. He is managed on metformin  1000 mg bid, pioglitazone  45 mg daily, and semaglutide  (Ozempic ) 1 mg weekly.    Edwin [name] has a history of hypertension. He is managed on amlodipine  5 mg daily.    Edwin [name] has a history of hyperlipidemia. He is managed on atorvastatin  40 mg daily.  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   Fever 03/26/2023   Sepsis secondary to UTI (HCC) 03/26/2023   Intertrigo 03/22/2023   At risk for obstructive sleep apnea 01/03/2022   Plantar fasciitis 01/03/2022   Pain due to onychomycosis of toenails of both feet 01/23/2021   Lymphadenopathy of head and neck 08/31/2020   Class 3 severe obesity due to excess calories with serious comorbidity and body mass index (BMI) of 40.0 to 44.9 in adult 08/30/2020   Allergic rhinitis 05/30/2020   Erectile dysfunction 05/30/2020   Eustachian tube dysfunction, left 10/20/2019   Seborrheic keratosis 04/15/2017   PVC's (premature ventricular contractions) 09/03/2014   Essential hypertension 11/03/2013   Type 2 diabetes mellitus without complications (HCC) 08/03/2013   Hyperlipidemia 08/03/2013   Low serum testosterone  level 08/03/2013   Past Surgical History:  Procedure Laterality Date   COLONOSCOPY  10 years ago   at [facility]  normal exam   HERNIA REPAIR     NASAL SINUS SURGERY  2010   UPPER GASTROINTESTINAL ENDOSCOPY  10 years ago   WISDOM TOOTH EXTRACTION     Family History  Problem Relation Age of Onset   Aneurysm Mother 57       Deceased   Healthy Father        Living   Prostate cancer Paternal Uncle    Healthy Daughter        x3   Colon cancer Neg Hx    Esophageal cancer Neg Hx    Rectal cancer Neg Hx    Stomach cancer Neg Hx    Outpatient Medications Prior to Visit  Medication Sig Dispense Refill   alfuzosin  (UROXATRAL ) 10 MG 24 hr tablet Take 1 tablet (10 mg total) by mouth daily. 30 tablet 11   amLODipine  (NORVASC ) 5 MG tablet Take 1 tablet by mouth once daily 90 tablet 3   aspirin  EC 81 MG tablet Take 1 tablet by mouth daily.     atorvastatin  (LIPITOR) 40 MG tablet Take 1 tablet (40 mg total) by mouth daily. 1 daily 90 tablet 3   Blood Glucose Monitoring Suppl (ONETOUCH VERIO FLEX SYSTEM) w/Device KIT 1 Units by Does not apply route daily. Dx:E11.9 1 kit 0   clotrimazole -betamethasone  (LOTRISONE ) cream Apply 1 Application topically daily. 30 g 2   clotrimazole -betamethasone  (LOTRISONE ) cream Apply topically 2 (two) times daily. 30 g 1   fluticasone  (FLONASE ) 50 MCG/ACT nasal spray Place 2 sprays into both nostrils daily as  needed for allergies or rhinitis. 16 g 6   glucose blood test strip Use as instructed daily Dx: E11.9 100 each 3   metFORMIN  (GLUCOPHAGE ) 1000 MG tablet Take 1 tablet by mouth twice daily with food 180 tablet 3   multivitamin (ONE-A-DAY MEN'S) TABS tablet Take 1 tablet by mouth daily.     OneTouch Delica Lancets 33G MISC 1 Stick by Does not apply route daily. Dx:E11.9 100 each 3   pioglitazone  (ACTOS ) 45 MG tablet Take 1 tablet (45 mg total) by mouth daily. 90 tablet 3   polyethylene glycol (MIRALAX  / GLYCOLAX ) 17 g packet Take 17 g by mouth daily as needed for mild constipation. 14 each 0   Semaglutide , 1 MG/DOSE, (OZEMPIC , 1 MG/DOSE,) 4 MG/3ML SOPN INJECT 1 MG  SUBCUTANEOUSLY AS DIRECTED WEEKLY 3 mL 11   vitamin C  (VITAMIN C ) 500 MG tablet Take 1 tablet (500 mg total) by mouth daily. Please take for 2 weeks     No facility-administered medications prior to visit.   Allergies  Allergen Reactions   Avelox [Moxifloxacin Hcl In Nacl] Hives and Itching   Farxiga  [Dapagliflozin ] Hives   Doxycycline  Rash   Objective:   Today's Vitals   09/23/23 0929  BP: 126/80  Pulse: 81  Temp: (!) 97 F (36.1 C)  SpO2: 97%  Weight: 299 lb (135.6 kg)  Height: 5' 11 (1.803 m)   Body mass index is 41.7 kg/m.   General: Well developed, well nourished. No acute distress. Psych: Alert and oriented. Normal mood and affect.  Health Maintenance Due  Topic Date Due   Pneumococcal Vaccine 99-65 Years old (1 of 2 - PCV) Never done   Hepatitis B Vaccines (1 of 3 - 19+ 3-dose series) Never done   Zoster Vaccines- Shingrix (1 of 2) Never done   Diabetic kidney evaluation - Urine ACR  07/25/2014   OPHTHALMOLOGY EXAM  08/22/2022     Assessment & Plan:   Problem List Items Addressed This Visit       Cardiovascular and Mediastinum   Essential hypertension   Blood pressure is in good control. Continue amlodipine  5 mg daily.      Relevant Orders   Microalbumin / creatinine urine ratio   Basic metabolic panel with GFR     Endocrine   Type 2 diabetes mellitus without complications (HCC) - Primary   I will check annual DM labs today. Continue metformin  1000 mg bid, pioglitazone  45 mg daily, and semaglutide  (Ozempic ) 1 mg weekly.      Relevant Orders   Lipid panel   Microalbumin / creatinine urine ratio   Basic metabolic panel with GFR   Hemoglobin A1c   Urinalysis, Routine w reflex microscopic     Other   Hyperlipidemia   At goal. Continue atorvastatin  40 mg daily.      Relevant Orders   Lipid panel    Return in about 3 months (around 12/24/2023) for Reassessment.   Garnette CHRISTELLA Simpler, MD

## 2023-10-01 ENCOUNTER — Encounter: Payer: Self-pay | Admitting: Family Medicine

## 2023-11-25 ENCOUNTER — Telehealth: Payer: Self-pay | Admitting: Family Medicine

## 2023-11-25 NOTE — Telephone Encounter (Signed)
 Copied from CRM #[id number]. Topic: General - Other >> Nov 25, 2023  2:33 PM Edwin [name] wrote: Reason for CRM: pt calling to [facility] nurse to give call, he is dropping off fmla papers and his wife is a part of his fmla now he would like to know how to handle this. Pt call back # is ,[phone] he is dropping paper work off today to the office.

## 2023-11-25 NOTE — Telephone Encounter (Signed)
 Patient dropped off document FMLA, to be filled out by provider. Patient requested to send it back via Fax within 2-days. Document is located in providers tray at front office.Please advise at [facility] [phone]

## 2023-11-26 NOTE — Telephone Encounter (Signed)
Form on deskto be filled out.  Dm/cma  

## 2023-11-27 NOTE — Telephone Encounter (Signed)
 Left VM to RTN call regarding questions on forms. Dm/cma

## 2023-11-28 DIAGNOSIS — Z0279 Encounter for issue of other medical certificate: Secondary | ICD-10-CM

## 2023-11-28 NOTE — Telephone Encounter (Signed)
 Patient notified that his FMLA form is ready and if/when he has an issue like earlier this year (hospital stay) we will fill out one for his wife.   Will fax to [employer] at [phone] and then mail original to patients home address. Dm/cma

## 2023-12-04 NOTE — Telephone Encounter (Unsigned)
 Copied from CRM [id number]. Topic: General - Other >> Dec 03, 2023  4:03 PM Rea ORN wrote: Reason for CRM: Pt called to advise that FMLA start and end date is incomplete. Pt said it needs to say Start date 12/05/23 and End date 12/03/24 in section 3 option 2. Please call back to advise once complete.

## 2023-12-04 NOTE — Telephone Encounter (Signed)
 FMLA form fixed and re-faxed to [employer].  Patient notified VIA phone.  Dm/cma

## 2023-12-30 ENCOUNTER — Other Ambulatory Visit: Payer: Self-pay | Admitting: Family Medicine

## 2023-12-30 DIAGNOSIS — I10 Essential (primary) hypertension: Secondary | ICD-10-CM

## 2024-01-01 ENCOUNTER — Other Ambulatory Visit: Payer: Self-pay | Admitting: Family Medicine

## 2024-01-01 DIAGNOSIS — E782 Mixed hyperlipidemia: Secondary | ICD-10-CM

## 2024-01-06 ENCOUNTER — Ambulatory Visit: Admitting: Family Medicine

## 2024-02-10 ENCOUNTER — Encounter: Payer: Self-pay | Admitting: Family Medicine

## 2024-02-10 ENCOUNTER — Ambulatory Visit: Admitting: Family Medicine

## 2024-02-10 ENCOUNTER — Ambulatory Visit: Payer: Self-pay | Admitting: Family Medicine

## 2024-02-10 VITALS — BP 130/80 | HR 79 | Temp 98.2°F | Ht 71.0 in | Wt 308.4 lb

## 2024-02-10 DIAGNOSIS — Z23 Encounter for immunization: Secondary | ICD-10-CM

## 2024-02-10 DIAGNOSIS — R7401 Elevation of levels of liver transaminase levels: Secondary | ICD-10-CM | POA: Insufficient documentation

## 2024-02-10 DIAGNOSIS — Z7985 Long-term (current) use of injectable non-insulin antidiabetic drugs: Secondary | ICD-10-CM

## 2024-02-10 DIAGNOSIS — M545 Low back pain, unspecified: Secondary | ICD-10-CM | POA: Insufficient documentation

## 2024-02-10 DIAGNOSIS — R350 Frequency of micturition: Secondary | ICD-10-CM

## 2024-02-10 DIAGNOSIS — E782 Mixed hyperlipidemia: Secondary | ICD-10-CM

## 2024-02-10 DIAGNOSIS — Z7984 Long term (current) use of oral hypoglycemic drugs: Secondary | ICD-10-CM

## 2024-02-10 DIAGNOSIS — I10 Essential (primary) hypertension: Secondary | ICD-10-CM

## 2024-02-10 DIAGNOSIS — E119 Type 2 diabetes mellitus without complications: Secondary | ICD-10-CM

## 2024-02-10 LAB — COMPREHENSIVE METABOLIC PANEL WITH GFR
ALT: 18 U/L (ref 0–53)
AST: 16 U/L (ref 0–37)
Albumin: 4.3 g/dL (ref 3.5–5.2)
Alkaline Phosphatase: 82 U/L (ref 39–117)
BUN: 12 mg/dL (ref 6–23)
CO2: 30 meq/L (ref 19–32)
Calcium: 9.2 mg/dL (ref 8.4–10.5)
Chloride: 103 meq/L (ref 96–112)
Creatinine, Ser: 0.9 mg/dL (ref 0.40–1.50)
GFR: 94.26 mL/min (ref >60.00)
Glucose, Bld: 127 mg/dL — ABNORMAL HIGH (ref 70–99)
Potassium: 3.7 meq/L (ref 3.5–5.1)
Sodium: 141 meq/L (ref 135–145)
Total Bilirubin: 0.5 mg/dL (ref 0.2–1.2)
Total Protein: 7.2 g/dL (ref 6.0–8.3)

## 2024-02-10 LAB — HEMOGLOBIN A1C: Hgb A1c MFr Bld: 6.5 % (ref 4.6–6.5)

## 2024-02-10 MED ORDER — IBUPROFEN 600 MG PO TABS: 600.0000 mg | ORAL_TABLET | Freq: Three times a day (TID) | ORAL | 0 refills | Status: AC | PRN

## 2024-02-10 MED ORDER — PIOGLITAZONE HCL 45 MG PO TABS: 45.0000 mg | ORAL_TABLET | Freq: Every day | ORAL | 3 refills | Status: AC

## 2024-02-10 MED ORDER — AMLODIPINE BESYLATE 5 MG PO TABS: 5.0000 mg | ORAL_TABLET | Freq: Every day | ORAL | 0 refills | Status: DC

## 2024-02-10 NOTE — Assessment & Plan Note (Addendum)
 A1c has been improving. Continue metformin  1000 mg bid, pioglitazone  45 mg daily, and semaglutide  (Ozempic ) 1 mg weekly.

## 2024-02-10 NOTE — Assessment & Plan Note (Signed)
 I iwll renew ibuprofen  for intermittent use. He notes he uses this about once a month.

## 2024-02-10 NOTE — Assessment & Plan Note (Signed)
At goal. Continue atorvastatin 40 mg daily.

## 2024-02-10 NOTE — Assessment & Plan Note (Signed)
 Blood pressure is in good control. Continue amlodipine 5 mg daily.

## 2024-02-10 NOTE — Progress Notes (Signed)
 [facility] [address] [place] [address] Dept: [phone] Dept Fax: [phone]  Chronic Care Office Visit  Subjective:    Patient ID: Edwin [name], male    DOB: 1965-12-02, 58 y.o..   MRN: [id number]  Chief Complaint  Patient presents with   Diabetes    3 month f/u  DM.  C/o having urine frequency and dark yellow.     History of Present Illness:  Patient is in today for reassessment of chronic medical conditions.   Edwin [name] has a history of Type 2 diabetes. He is managed on metformin  1000 mg bid, pioglitazone  45 mg daily, and semaglutide  (Ozempic ) 1 mg weekly.    Edwin [name] has a history of hypertension. He is managed on amlodipine  5 mg daily.    Edwin [name] has a history of hyperlipidemia. He is managed on atorvastatin  40 mg daily.  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 He fidns that takign an ibuprofen  does resolve this.  Edwin [name] has a history of UTIs and prior urosepsis. He notes more recently, he has had a darker urine with an odor and is urinating more frequently.  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   Sepsis secondary to UTI (HCC) 03/26/2023   At risk for obstructive sleep apnea 01/03/2022   Plantar fasciitis 01/03/2022   Pain due to onychomycosis of toenails of both feet 01/23/2021   Lymphadenopathy of head and neck 08/31/2020   Class 3 severe obesity due to excess calories with serious comorbidity and body mass index (BMI) of 40.0 to 44.9 in adult (HCC) 08/30/2020   Allergic rhinitis 05/30/2020   Erectile dysfunction 05/30/2020   Eustachian tube dysfunction, left 10/20/2019   Seborrheic keratosis 04/15/2017   PVC's (premature ventricular  contractions) 09/03/2014   Essential hypertension 11/03/2013   Type 2 diabetes mellitus without complications (HCC) 08/03/2013   Hyperlipidemia 08/03/2013   Low serum testosterone  level 08/03/2013   Past Surgical History:  Procedure Laterality Date   COLONOSCOPY  10 years ago   at [facility] normal exam   HERNIA REPAIR     NASAL SINUS SURGERY  2010   UPPER GASTROINTESTINAL ENDOSCOPY  10 years ago   WISDOM TOOTH EXTRACTION     Family History  Problem Relation Age of Onset   Aneurysm Mother 21       Deceased   Healthy Father        Living   Prostate cancer Paternal Uncle    Healthy Daughter        x3   Colon cancer Neg Hx    Esophageal cancer Neg Hx    Rectal cancer Neg Hx    Stomach cancer Neg Hx    Outpatient Medications Prior to Visit  Medication Sig Dispense Refill   alfuzosin  (UROXATRAL ) 10 MG 24 hr tablet Take 1 tablet (10 mg total) by mouth daily. 30 tablet 11   amLODipine  (NORVASC ) 5 MG tablet Take 1 tablet by mouth once daily 90 tablet 0   aspirin  EC 81 MG tablet Take 1 tablet by mouth daily.     atorvastatin  (LIPITOR) 40 MG tablet Take 1 tablet by mouth once daily 90 tablet 3   Blood Glucose Monitoring Suppl (ONETOUCH VERIO FLEX SYSTEM) w/Device  KIT 1 Units by Does not apply route daily. Dx:E11.9 1 kit 0   clotrimazole -betamethasone  (LOTRISONE ) cream Apply 1 Application topically daily. 30 g 2   clotrimazole -betamethasone  (LOTRISONE ) cream Apply topically 2 (two) times daily. 30 g 1   fluticasone  (FLONASE ) 50 MCG/ACT nasal spray Place 2 sprays into both nostrils daily as needed for allergies or rhinitis. 16 g 6   glucose blood test strip Use as instructed daily Dx: E11.9 100 each 3   metFORMIN  (GLUCOPHAGE ) 1000 MG tablet Take 1 tablet by mouth twice daily with food 180 tablet 3   multivitamin (ONE-A-DAY MEN'S) TABS tablet Take 1 tablet by mouth daily.     OneTouch Delica Lancets 33G MISC 1 Stick by Does not apply route daily. Dx:E11.9 100 each 3   pioglitazone   (ACTOS ) 45 MG tablet Take 1 tablet (45 mg total) by mouth daily. 90 tablet 3   polyethylene glycol (MIRALAX  / GLYCOLAX ) 17 g packet Take 17 g by mouth daily as needed for mild constipation. 14 each 0   Semaglutide , 1 MG/DOSE, (OZEMPIC , 1 MG/DOSE,) 4 MG/3ML SOPN INJECT 1 MG SUBCUTANEOUSLY AS DIRECTED WEEKLY 3 mL 11   vitamin C  (VITAMIN C ) 500 MG tablet Take 1 tablet (500 mg total) by mouth daily. Please take for 2 weeks     No facility-administered medications prior to visit.   Allergies  Allergen Reactions   Avelox [Moxifloxacin Hcl In Nacl] Hives and Itching   Farxiga  [Dapagliflozin ] Hives   Doxycycline  Rash     Objective:   Today's Vitals   02/10/24 1107  BP: 130/80  Pulse: 79  Temp: 98.2 F (36.8 C)  TempSrc: Temporal  SpO2: 98%  Weight: (!) 308 lb 6.4 oz (139.9 kg)  Height: 5' 11 (1.803 m)   Body mass index is 43.01 kg/m.   General: Well developed, well nourished. No acute distress. Psych: Alert and oriented. Normal mood and affect.  Health Maintenance Due  Topic Date Due   Pneumococcal Vaccine: 50+ Years (1 of 2 - PCV) Never done   Hepatitis B Vaccines 19-59 Average Risk (1 of 3 - 19+ 3-dose series) Never done   Zoster Vaccines- Shingrix (1 of 2) Never done   Influenza Vaccine  Never done   COVID-19 Vaccine (4 - 2025-26 season) 11/04/2023   Lab Results Lab Results  Component Value Date   HGBA1C 7.1 (H) 09/23/2023   HGBA1C 7.2 (H) 06/24/2023   HGBA1C 7.3 (H) 03/26/2023   Lipid Panel:  Lab Results  Component Value Date   CHOL 139 09/23/2023   HDL 42.20 09/23/2023   LDLCALC 85 09/23/2023   TRIG 59.0 09/23/2023    Assessment & Plan:   Problem List Items Addressed This Visit       Cardiovascular and Mediastinum   Essential hypertension   Blood pressure is in good control. Continue amlodipine  5 mg daily.      Relevant Medications   amLODipine  (NORVASC ) 5 MG tablet   Other Relevant Orders   Comprehensive metabolic panel with GFR     Endocrine    Type 2 diabetes mellitus without complications (HCC) - Primary   A1c has been improving. Continue metformin  1000 mg bid, pioglitazone  45 mg daily, and semaglutide  (Ozempic ) 1 mg weekly.      Relevant Medications   pioglitazone  (ACTOS ) 45 MG tablet   Other Relevant Orders   Comprehensive metabolic panel with GFR   Hemoglobin A1c     Other   Elevated ALT measurement   I will repeat his  LFTs today.      Relevant Orders   Comprehensive metabolic panel with GFR   Hyperlipidemia   At goal. Continue atorvastatin  40 mg daily.      Relevant Medications   amLODipine  (NORVASC ) 5 MG tablet   Low back pain without sciatica   I iwll renew ibuprofen  for intermittent use. He notes he uses this about once a month.      Relevant Medications   ibuprofen  (ADVIL ) 600 MG tablet   Other Visit Diagnoses       Urinary frequency       Unclear if this is a concern, but at high risk due to prior UTIs and sepsis. I will check a UA today.   Relevant Orders   Urinalysis w microscopic + reflex cultur     Long term current use of oral hypoglycemic drug         Long-term current use of injectable noninsulin antidiabetic medication         Need for pneumococcal 20-valent conjugate vaccination       Relevant Orders   Pneumococcal conjugate vaccine 20-valent (Prevnar 20)       Return in about 3 months (around 05/10/2024) for Reassessment.   Garnette CHRISTELLA Simpler, MD  I,Emily Lagle,acting as a scribe for Garnette CHRISTELLA Simpler, MD.,have documented all relevant documentation on the behalf of Garnette CHRISTELLA Simpler, MD.  I, Garnette CHRISTELLA Simpler, MD, have reviewed all documentation for this visit. The documentation on 02/10/2024 for the exam, diagnosis, procedures, and orders are all accurate and complete.

## 2024-02-10 NOTE — Assessment & Plan Note (Signed)
 I will repeat his LFTs today.

## 2024-02-18 ENCOUNTER — Ambulatory Visit: Payer: Self-pay

## 2024-02-18 NOTE — Telephone Encounter (Signed)
° ° °  Copied from CRM #[id number]. Topic: Clinical - Red Word Triage >> Feb 17, 2024  4:37 PM China J wrote: [facility] that prompted transfer to Nurse Triage: Patient is still having pain and redness on the area he got a pneumococcal injection at from last Monday. >> Feb 17, 2024  5:26 PM Drema MATSU wrote: Pt no longer wanted to hold. Please call pt. >> Feb 17, 2024  5:10 PM Drema MATSU wrote: Pt stated that he was holding for 30 mins and was hung up on. He is still experiencing redness and the area is very tender.

## 2024-02-18 NOTE — Telephone Encounter (Signed)
 FYI Only or Action Required?: FYI only for provider: Home care.  Patient was last seen in primary care on 02/10/2024 by Thedora Garnette HERO, MD.  Called Nurse Triage reporting Injection Site Pain.  Symptoms began a week ago.  Interventions attempted: Nothing.  Symptoms are: gradually improving.  Triage Disposition: Home Care  Patient/caregiver understands and will follow disposition?: Yes  Reason for Disposition  Injection site reaction to any vaccine  Answer Assessment - Initial Assessment Questions 1. SYMPTOMS: What is the main symptom? (e.g., pain, redness, or swelling at injection site; feeling tired, fever, muscle aches)      Tender to touch at injection site, quarter sized red spot  in left arm  2. ONSET: When was the vaccine (shot) given? How much later did the begin? (e.g., hours, days ago)      Last Monday  3. SEVERITY: How bad is it?      Mild  4. FEVER: Do you have a fever? If Yes, ask: What is your temperature, how was it measured, and when did it start?      Denies  5. IMMUNIZATIONS GIVEN: What shots have you recently received?     Pneumococcal   7. OTHER SYMPTOMS: Do you have any other symptoms?     Denies  Protocols used: Immunization Reactions-A-AH

## 2024-02-19 NOTE — Telephone Encounter (Signed)
 Patient is having tenderness and redness at injection site

## 2024-02-21 NOTE — Telephone Encounter (Signed)
 Spoke to patient.  He is feeling better.   Dm/cma

## 2024-03-09 ENCOUNTER — Ambulatory Visit: Payer: Self-pay

## 2024-03-09 NOTE — Telephone Encounter (Signed)
 FYI Only or Action Required?: FYI only for provider: appointment scheduled on 03/11/24.  Patient was last seen in primary care on 02/10/2024 by Thedora Garnette HERO, MD.  Called Nurse Triage reporting Cough.  Symptoms began yesterday.  Interventions attempted: OTC medications: Cough syrup.  Symptoms are: stable.  Triage Disposition: Home Care  Patient/caregiver understands and will follow disposition?: Yes    Copied from CRM [id number]. Topic: Clinical - Red Word Triage >> Mar 09, 2024 11:53 AM Roselie BROCKS wrote: Red Word that prompted transfer to Nurse Triage: Patient states that the PCP tells him to call as soon as he feels ill ,because he is diabetic, and patient states he has fever a painful sore throat, deep cough. Reason for Disposition  Cough  Answer Assessment - Initial Assessment Questions 1. ONSET: When did the cough begin?      yesterday 2. SEVERITY: How bad is the cough today?      Moderate, kept awake last night 3. SPUTUM: Describe the color of your sputum (e.g., none, dry cough; clear, white, yellow, green)     green 4. HEMOPTYSIS: Are you coughing up any blood? If Yes, ask: How much? (e.g., flecks, streaks, tablespoons, etc.)     no 5. DIFFICULTY BREATHING: Are you having difficulty breathing? If Yes, ask: How bad is it? (e.g., mild, moderate, severe)      denies 6. FEVER: Do you have a fever? If Yes, ask: What is your temperature, how was it measured, and when did it start?     99 orally 7. CARDIAC HISTORY: Do you have any history of heart disease? (e.g., heart attack, congestive heart failure)      no 8. LUNG HISTORY: Do you have any history of lung disease?  (e.g., pulmonary embolus, asthma, emphysema)     no 9. PE RISK FACTORS: Do you have a history of blood clots? (or: recent major surgery, recent prolonged travel, bedridden)     no 10. OTHER SYMPTOMS: Do you have any other symptoms? (e.g., runny nose, wheezing, chest pain)        denies  Protocols used: Cough - Acute Productive-A-AH

## 2024-03-09 NOTE — Telephone Encounter (Signed)
 Noted. Dm/cma

## 2024-03-10 ENCOUNTER — Ambulatory Visit: Payer: Self-pay

## 2024-03-10 NOTE — Telephone Encounter (Signed)
 Noted. Appointment canceled. Patient was advised by nurse triage.

## 2024-03-10 NOTE — Telephone Encounter (Signed)
 FYI Only or Action Required?: FYI only for provider: Cancel 1/7 appt.  Patient was last seen in primary care on 02/10/2024 by Thedora Garnette HERO, MD.  Called Nurse Triage reporting Appointment.    Symptoms are: gradually improving.  Triage Disposition: Information or Advice Only Call  Patient/caregiver understands and will follow disposition?: Yes      Copied from CRM [id number]. Topic: Clinical - Red Word Triage >> Mar 10, 2024  2:30 PM Deaijah H wrote: Red Word that prompted transfer to Nurse Triage: unsure to cancel or not due to fever breaking and minimum coughing/mucus Reason for Disposition  Health information question, no triage required and triager able to answer question  Answer Assessment - Initial Assessment Questions 1. REASON FOR CALL: What is the main reason for your call? or How can I best help you?     Patient called to cancel appt. And report his symptoms are improving with the Robitussin. No SOB or respiratory distress noted. Appt. Canceled per patient request. No further concerns at this time.  Protocols used: Information Only Call - No Triage-A-AH

## 2024-03-11 ENCOUNTER — Ambulatory Visit: Admitting: Family Medicine

## 2024-03-20 ENCOUNTER — Other Ambulatory Visit: Payer: Self-pay | Admitting: Family Medicine

## 2024-03-20 DIAGNOSIS — I10 Essential (primary) hypertension: Secondary | ICD-10-CM

## 2024-03-24 ENCOUNTER — Inpatient Hospital Stay: Admitting: Family Medicine

## 2024-04-03 ENCOUNTER — Other Ambulatory Visit: Payer: Self-pay | Admitting: Urology

## 2024-04-03 DIAGNOSIS — N138 Other obstructive and reflux uropathy: Secondary | ICD-10-CM

## 2024-05-11 ENCOUNTER — Ambulatory Visit: Admitting: Family Medicine
